# Patient Record
Sex: Female | Born: 1940 | Race: White | Hispanic: No | Marital: Married | Smoking: Former smoker
Health system: Southern US, Community
[De-identification: ages and names within clinical notes are randomized; demographics above are authoritative.]

## PROBLEM LIST (undated history)

## (undated) DIAGNOSIS — R112 Nausea with vomiting, unspecified: Secondary | ICD-10-CM

## (undated) DIAGNOSIS — M109 Gout, unspecified: Secondary | ICD-10-CM

## (undated) DIAGNOSIS — I639 Cerebral infarction, unspecified: Secondary | ICD-10-CM

## (undated) DIAGNOSIS — I10 Essential (primary) hypertension: Secondary | ICD-10-CM

## (undated) DIAGNOSIS — E7800 Pure hypercholesterolemia, unspecified: Secondary | ICD-10-CM

## (undated) DIAGNOSIS — M1990 Unspecified osteoarthritis, unspecified site: Secondary | ICD-10-CM

## (undated) DIAGNOSIS — Z9889 Other specified postprocedural states: Secondary | ICD-10-CM

## (undated) DIAGNOSIS — E119 Type 2 diabetes mellitus without complications: Secondary | ICD-10-CM

## (undated) DIAGNOSIS — M0520 Rheumatoid vasculitis with rheumatoid arthritis of unspecified site: Secondary | ICD-10-CM

## (undated) HISTORY — DX: Unspecified osteoarthritis, unspecified site: M19.90

## (undated) HISTORY — DX: Essential (primary) hypertension: I10

## (undated) HISTORY — DX: Hypercalcemia: E83.52

## (undated) HISTORY — DX: Pure hypercholesterolemia, unspecified: E78.00

## (undated) HISTORY — DX: Type 2 diabetes mellitus without complications: E11.9

## (undated) HISTORY — DX: Rheumatoid vasculitis with rheumatoid arthritis of unspecified site: M05.20

---
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
## 1962-10-21 HISTORY — PX: DILATION AND CURETTAGE OF UTERUS: SHX78

## 1971-10-22 HISTORY — PX: PARTIAL HYSTERECTOMY: SHX80

## 1992-10-21 HISTORY — PX: OTHER SURGICAL HISTORY: SHX169

## 1996-10-21 HISTORY — PX: OTHER SURGICAL HISTORY: SHX169

## 2008-10-21 HISTORY — PX: MENISCUS REPAIR: SHX5179

## 2011-04-04 DIAGNOSIS — R0989 Other specified symptoms and signs involving the circulatory and respiratory systems: Secondary | ICD-10-CM | POA: Insufficient documentation

## 2012-01-24 DIAGNOSIS — H6980 Other specified disorders of Eustachian tube, unspecified ear: Secondary | ICD-10-CM | POA: Insufficient documentation

## 2014-10-05 DIAGNOSIS — E7800 Pure hypercholesterolemia, unspecified: Secondary | ICD-10-CM | POA: Insufficient documentation

## 2014-10-05 DIAGNOSIS — M0579 Rheumatoid arthritis with rheumatoid factor of multiple sites without organ or systems involvement: Secondary | ICD-10-CM | POA: Insufficient documentation

## 2015-04-06 DIAGNOSIS — I10 Essential (primary) hypertension: Secondary | ICD-10-CM | POA: Insufficient documentation

## 2015-10-09 DIAGNOSIS — E119 Type 2 diabetes mellitus without complications: Secondary | ICD-10-CM | POA: Insufficient documentation

## 2015-10-09 DIAGNOSIS — R109 Unspecified abdominal pain: Secondary | ICD-10-CM | POA: Insufficient documentation

## 2016-10-21 HISTORY — PX: OTHER SURGICAL HISTORY: SHX169

## 2017-04-09 DIAGNOSIS — M0579 Rheumatoid arthritis with rheumatoid factor of multiple sites without organ or systems involvement: Secondary | ICD-10-CM | POA: Diagnosis not present

## 2017-04-09 DIAGNOSIS — I10 Essential (primary) hypertension: Secondary | ICD-10-CM | POA: Diagnosis not present

## 2017-04-09 DIAGNOSIS — E7800 Pure hypercholesterolemia, unspecified: Secondary | ICD-10-CM | POA: Diagnosis not present

## 2017-04-09 DIAGNOSIS — N8110 Cystocele, unspecified: Secondary | ICD-10-CM | POA: Diagnosis not present

## 2017-04-09 DIAGNOSIS — M25561 Pain in right knee: Secondary | ICD-10-CM | POA: Diagnosis not present

## 2017-04-09 DIAGNOSIS — M7050 Other bursitis of knee, unspecified knee: Secondary | ICD-10-CM | POA: Diagnosis not present

## 2017-04-09 DIAGNOSIS — E119 Type 2 diabetes mellitus without complications: Secondary | ICD-10-CM | POA: Diagnosis not present

## 2017-05-14 DIAGNOSIS — N393 Stress incontinence (female) (male): Secondary | ICD-10-CM | POA: Diagnosis not present

## 2017-05-14 DIAGNOSIS — N993 Prolapse of vaginal vault after hysterectomy: Secondary | ICD-10-CM | POA: Diagnosis not present

## 2017-05-14 DIAGNOSIS — I10 Essential (primary) hypertension: Secondary | ICD-10-CM | POA: Diagnosis not present

## 2017-07-17 DIAGNOSIS — Z23 Encounter for immunization: Secondary | ICD-10-CM | POA: Diagnosis not present

## 2017-07-23 DIAGNOSIS — I10 Essential (primary) hypertension: Secondary | ICD-10-CM | POA: Diagnosis not present

## 2017-07-23 DIAGNOSIS — M7052 Other bursitis of knee, left knee: Secondary | ICD-10-CM | POA: Diagnosis not present

## 2017-07-24 DIAGNOSIS — R9431 Abnormal electrocardiogram [ECG] [EKG]: Secondary | ICD-10-CM | POA: Diagnosis not present

## 2017-07-24 DIAGNOSIS — Z01818 Encounter for other preprocedural examination: Secondary | ICD-10-CM | POA: Diagnosis not present

## 2017-07-28 DIAGNOSIS — I4510 Unspecified right bundle-branch block: Secondary | ICD-10-CM | POA: Diagnosis not present

## 2017-07-28 DIAGNOSIS — I10 Essential (primary) hypertension: Secondary | ICD-10-CM | POA: Diagnosis not present

## 2017-07-30 DIAGNOSIS — I4510 Unspecified right bundle-branch block: Secondary | ICD-10-CM | POA: Diagnosis not present

## 2017-07-31 DIAGNOSIS — N393 Stress incontinence (female) (male): Secondary | ICD-10-CM | POA: Diagnosis not present

## 2017-07-31 DIAGNOSIS — Z79899 Other long term (current) drug therapy: Secondary | ICD-10-CM | POA: Diagnosis not present

## 2017-07-31 DIAGNOSIS — N993 Prolapse of vaginal vault after hysterectomy: Secondary | ICD-10-CM | POA: Insufficient documentation

## 2017-07-31 DIAGNOSIS — Z87891 Personal history of nicotine dependence: Secondary | ICD-10-CM | POA: Diagnosis not present

## 2017-07-31 DIAGNOSIS — Z8673 Personal history of transient ischemic attack (TIA), and cerebral infarction without residual deficits: Secondary | ICD-10-CM | POA: Diagnosis not present

## 2017-07-31 DIAGNOSIS — Z880 Allergy status to penicillin: Secondary | ICD-10-CM | POA: Diagnosis not present

## 2017-07-31 DIAGNOSIS — E119 Type 2 diabetes mellitus without complications: Secondary | ICD-10-CM | POA: Diagnosis not present

## 2017-07-31 DIAGNOSIS — M069 Rheumatoid arthritis, unspecified: Secondary | ICD-10-CM | POA: Diagnosis not present

## 2017-07-31 DIAGNOSIS — E785 Hyperlipidemia, unspecified: Secondary | ICD-10-CM | POA: Diagnosis not present

## 2017-07-31 DIAGNOSIS — I10 Essential (primary) hypertension: Secondary | ICD-10-CM | POA: Diagnosis not present

## 2017-08-01 DIAGNOSIS — M069 Rheumatoid arthritis, unspecified: Secondary | ICD-10-CM | POA: Diagnosis not present

## 2017-08-01 DIAGNOSIS — Z87891 Personal history of nicotine dependence: Secondary | ICD-10-CM | POA: Diagnosis not present

## 2017-08-01 DIAGNOSIS — I10 Essential (primary) hypertension: Secondary | ICD-10-CM | POA: Diagnosis not present

## 2017-08-01 DIAGNOSIS — Z880 Allergy status to penicillin: Secondary | ICD-10-CM | POA: Diagnosis not present

## 2017-08-01 DIAGNOSIS — E119 Type 2 diabetes mellitus without complications: Secondary | ICD-10-CM | POA: Diagnosis not present

## 2017-08-01 DIAGNOSIS — E785 Hyperlipidemia, unspecified: Secondary | ICD-10-CM | POA: Diagnosis not present

## 2017-08-01 DIAGNOSIS — Z79899 Other long term (current) drug therapy: Secondary | ICD-10-CM | POA: Diagnosis not present

## 2017-08-01 DIAGNOSIS — N993 Prolapse of vaginal vault after hysterectomy: Secondary | ICD-10-CM | POA: Diagnosis not present

## 2017-08-01 DIAGNOSIS — N393 Stress incontinence (female) (male): Secondary | ICD-10-CM | POA: Diagnosis not present

## 2017-08-01 DIAGNOSIS — Z8673 Personal history of transient ischemic attack (TIA), and cerebral infarction without residual deficits: Secondary | ICD-10-CM | POA: Diagnosis not present

## 2017-08-07 DIAGNOSIS — N993 Prolapse of vaginal vault after hysterectomy: Secondary | ICD-10-CM | POA: Diagnosis not present

## 2017-08-07 DIAGNOSIS — N393 Stress incontinence (female) (male): Secondary | ICD-10-CM | POA: Diagnosis not present

## 2017-09-10 DIAGNOSIS — Z0000 Encounter for general adult medical examination without abnormal findings: Secondary | ICD-10-CM | POA: Diagnosis not present

## 2017-09-10 DIAGNOSIS — E7800 Pure hypercholesterolemia, unspecified: Secondary | ICD-10-CM | POA: Diagnosis not present

## 2017-09-10 DIAGNOSIS — E119 Type 2 diabetes mellitus without complications: Secondary | ICD-10-CM | POA: Diagnosis not present

## 2017-09-10 DIAGNOSIS — I10 Essential (primary) hypertension: Secondary | ICD-10-CM | POA: Diagnosis not present

## 2017-09-10 DIAGNOSIS — M0579 Rheumatoid arthritis with rheumatoid factor of multiple sites without organ or systems involvement: Secondary | ICD-10-CM | POA: Diagnosis not present

## 2017-11-04 DIAGNOSIS — I10 Essential (primary) hypertension: Secondary | ICD-10-CM | POA: Diagnosis not present

## 2017-11-04 DIAGNOSIS — N993 Prolapse of vaginal vault after hysterectomy: Secondary | ICD-10-CM | POA: Diagnosis not present

## 2017-11-04 DIAGNOSIS — N393 Stress incontinence (female) (male): Secondary | ICD-10-CM | POA: Diagnosis not present

## 2017-11-21 DIAGNOSIS — Z1231 Encounter for screening mammogram for malignant neoplasm of breast: Secondary | ICD-10-CM | POA: Diagnosis not present

## 2017-12-17 DIAGNOSIS — M0579 Rheumatoid arthritis with rheumatoid factor of multiple sites without organ or systems involvement: Secondary | ICD-10-CM | POA: Diagnosis not present

## 2017-12-17 DIAGNOSIS — M542 Cervicalgia: Secondary | ICD-10-CM | POA: Diagnosis not present

## 2017-12-17 DIAGNOSIS — I10 Essential (primary) hypertension: Secondary | ICD-10-CM | POA: Diagnosis not present

## 2017-12-17 DIAGNOSIS — E7800 Pure hypercholesterolemia, unspecified: Secondary | ICD-10-CM | POA: Diagnosis not present

## 2017-12-17 DIAGNOSIS — E119 Type 2 diabetes mellitus without complications: Secondary | ICD-10-CM | POA: Diagnosis not present

## 2018-03-17 ENCOUNTER — Ambulatory Visit
Admission: RE | Admit: 2018-03-17 | Discharge: 2018-03-17 | Disposition: A | Discharge status: Home / Self Care | Payer: Medicare HMO | Source: Ambulatory Visit | Attending: Internal Medicine | Admitting: Internal Medicine

## 2018-03-17 ENCOUNTER — Ambulatory Visit (INDEPENDENT_AMBULATORY_CARE_PROVIDER_SITE_OTHER): Payer: Medicare HMO | Admitting: Internal Medicine

## 2018-03-17 ENCOUNTER — Encounter: Payer: Self-pay | Admitting: Internal Medicine

## 2018-03-17 VITALS — BP 146/80 | HR 75 | Temp 98.4°F | Ht 61.5 in | Wt 167.0 lb

## 2018-03-17 DIAGNOSIS — E785 Hyperlipidemia, unspecified: Secondary | ICD-10-CM | POA: Diagnosis not present

## 2018-03-17 DIAGNOSIS — M159 Polyosteoarthritis, unspecified: Secondary | ICD-10-CM | POA: Insufficient documentation

## 2018-03-17 DIAGNOSIS — M25561 Pain in right knee: Principal | ICD-10-CM

## 2018-03-17 DIAGNOSIS — I10 Essential (primary) hypertension: Secondary | ICD-10-CM | POA: Diagnosis not present

## 2018-03-17 DIAGNOSIS — M1711 Unilateral primary osteoarthritis, right knee: Secondary | ICD-10-CM | POA: Diagnosis not present

## 2018-03-17 DIAGNOSIS — Z79899 Other long term (current) drug therapy: Secondary | ICD-10-CM

## 2018-03-17 DIAGNOSIS — M1A9XX0 Chronic gout, unspecified, without tophus (tophi): Secondary | ICD-10-CM | POA: Diagnosis not present

## 2018-03-17 DIAGNOSIS — G8929 Other chronic pain: Secondary | ICD-10-CM | POA: Diagnosis not present

## 2018-03-17 DIAGNOSIS — E782 Mixed hyperlipidemia: Secondary | ICD-10-CM | POA: Insufficient documentation

## 2018-03-17 DIAGNOSIS — M8949 Other hypertrophic osteoarthropathy, multiple sites: Secondary | ICD-10-CM | POA: Insufficient documentation

## 2018-03-17 DIAGNOSIS — M150 Primary generalized (osteo)arthritis: Secondary | ICD-10-CM | POA: Diagnosis not present

## 2018-03-17 DIAGNOSIS — M069 Rheumatoid arthritis, unspecified: Secondary | ICD-10-CM | POA: Diagnosis not present

## 2018-03-17 DIAGNOSIS — E1169 Type 2 diabetes mellitus with other specified complication: Secondary | ICD-10-CM

## 2018-03-17 MED ORDER — AMLODIPINE BESYLATE 5 MG PO TABS: 5.0000 mg | ORAL_TABLET | Freq: Every day | ORAL | 1 refills | Status: DC

## 2018-03-17 NOTE — Progress Notes (Signed)
Patient ID: Debra Madden, female   DOB: [birth year], 77 y.o.   MRN: [id number]   Location:  [facility]  Provider: DR Arletha Grippe  Code Status:  Goals of Care:  Advanced Directives 03/17/2018  Does Patient Have a Medical Advance Directive? Yes  Type of Advance Directive [place]  Does patient want to make changes to medical advance directive? No - Patient declined  Copy of [place] in Chart? No - copy requested     Chief Complaint  Patient presents with  . Establish Care    New patient, establish care, relocated from [place], [place]. Patient with increased pain in right lower leg x 3 weeks.     . Medication Refill    Norvasc 90 day supply, mailorder   . Advance Care Planning    HCPOA, copy requested     HPI: Patient is a 77 y.o. female seen today as new pt. She relocated from [place], [place] January 17, 2018 to be closer to family. She has achy pain in RLE x [redacted] weeks along lateral leg/thigh. Pain worse with prolonged sitting. No numbness/tingling. No heavy lifting but did a lot of stooping and bending while moving and getting new home ready. She has hx right plantar fasciitis. She takes aleve prn severe pain. Current pain is 0/10. She had right meniscal repair 9 yrs ago. She has DM and BS this AM was 119. Her last A1c <7%.  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 Takes MTX 6.4WO weekly + folic acid  Gout - stable on allopurinol. 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 She takes ASA daily  DM - BS stable on metformin; reported A1c <7%   Past Medical History:  Diagnosis Date  . Arthritis   . DM (diabetes mellitus) ([place])   . Hypercalcemia   . Hypercholesteremia   . Hypertension   . Rheumatoid arteritis     Past Surgical History:  Procedure Laterality Date  . bladder prolapse  2018   Dr. Ladean Raya  . carpal tunnel  Bilateral 1998  . DILATION AND CURETTAGE OF UTERUS  1964   Dr. Leonia Reader  . MENISCUS REPAIR Right 2010   Dr. Ladean Raya  . PARTIAL HYSTERECTOMY  1973   ovaries remain  . Stroke  1994   TIA  . TONSILLECTOMY  1956     reports that she has quit smoking. She has never used smokeless tobacco. She reports that she drinks about 4.2 oz of alcohol per week. She reports that she does not use drugs. Social History   Socioeconomic History  . Marital status: Married    Spouse name: Not on file  . Number of children: Not on file  . Years of education: Not on file  . Highest education level: Not on file  Occupational History  . Not on file  Social Needs  . Financial resource strain: Not on file  . Food insecurity:    Worry: Not on file    Inability: Not on file  . Transportation needs:    Medical: Not on file    Non-medical: Not on file  Tobacco Use  . Smoking status: Former Research scientist (life sciences)  . Smokeless tobacco: Never Used  . Tobacco comment: Quit at age 28  Substance and Sexual Activity  . Alcohol use: Yes    Alcohol/week: 4.2 oz  Types: 7 Glasses of wine per week  . Drug use: Never  . Sexual activity: Not Currently  Lifestyle  . Physical activity:    Days per week: Not on file    Minutes per session: Not on file  . Stress: Not on file  Relationships  . Social connections:    Talks on phone: Not on file    Gets together: Not on file    Attends religious service: Not on file    Active member of club or organization: Not on file    Attends meetings of clubs or organizations: Not on file    Relationship status: Not on file  . Intimate partner violence:    Fear of current or ex partner: Not on file    Emotionally abused: Not on file    Physically abused: Not on file    Forced sexual activity: Not on file  Other Topics Concern  . Not on file  Social History Narrative   Social History      Diet? Meats, fish, salads, occasionally shellfish, eggs, vegs, broc, cauliflower,  brusel sprouts, green beans, mixed vegs, potatoes, some pasta/rice.       Do you drink/eat things with caffeine? Coffee- 1 1/2 cup per day      Marital status?           married                         What year were you married? 1964      Do you live in a house, apartment, assisted living, condo, trailer, etc.? house      Is it one or more stories? One- no stairs      How many persons live in your home? 2      Do you have any pets in your home? (please list) no      Highest level of education completed? 4 year college degree      Current or past profession: teacher      Do you exercise?         yes                             Type & how often? Walk, water aerobics      Advanced Directives      Do you have a living will? yes      Do you have a DNR form?        no                          If not, do you want to discuss one?      Do you have signed POA/HPOA for forms? yes      Functional Status      Do you have difficulty bathing or dressing yourself? no      Do you have difficulty preparing food or eating? no      Do you have difficulty managing your medications? no      Do you have difficulty managing your finances? no      Do you have difficulty affording your medications? no    Family History  Problem Relation Age of Onset  . Hypertension Son   . Hypertension Son   . Hypertension Son     Allergies  Allergen Reactions  . Oxycodone Nausea And Vomiting  . Clindamycin/Lincomycin Rash  .  Penicillins Rash    Outpatient Encounter Medications as of 03/17/2018  Medication Sig  . allopurinol (ZYLOPRIM) 300 MG tablet Take 300 mg by mouth daily.  Marland Kitchen amLODipine (NORVASC) 5 MG tablet Take 5 mg by mouth daily.  Marland Kitchen aspirin EC 325 MG tablet Take 325 mg by mouth daily.  Marland Kitchen BIOTIN 5000 PO Take 1 capsule by mouth daily.  . cholecalciferol (VITAMIN D) 1000 units tablet Take 2,000 Units by mouth daily.  Marland Kitchen docusate sodium (COLACE) 100 MG capsule Take 100 mg by mouth daily as  needed for mild constipation.  . folic acid (FOLVITE) 035 MCG tablet Take 400 mcg by mouth daily.  . hydrochlorothiazide (HYDRODIURIL) 25 MG tablet Take 25 mg by mouth daily.  Marland Kitchen losartan (COZAAR) 100 MG tablet Take 100 mg by mouth daily.  . metFORMIN (GLUCOPHAGE) 500 MG tablet Take 500 mg by mouth daily.  . methotrexate 2.5 MG tablet Take 7.5 mg by mouth every 7 (seven) days.  . naproxen sodium (ALEVE) 220 MG tablet Take 220 mg by mouth daily as needed.  . pravastatin (PRAVACHOL) 40 MG tablet Take 40 mg by mouth daily.  . timolol (BETIMOL) 0.5 % ophthalmic solution Place 1 drop into both eyes daily.   No facility-administered encounter medications on file as of 03/17/2018.     Review of Systems:  Review of Systems  HENT:       Sinus problems  Eyes: Positive for visual disturbance (corrective lenses).  Musculoskeletal: Positive for arthralgias and joint swelling.  Psychiatric/Behavioral: Positive for sleep disturbance.  All other systems reviewed and are negative.   Health Maintenance  Topic Date Due  . TETANUS/TDAP  04/21/1960  . DEXA SCAN  04/21/2006  . PNA vac Low Risk Adult (2 of 2 - PCV13) 10/22/2015  . INFLUENZA VACCINE  05/21/2018    Physical Exam: Vitals:   03/17/18 1118  BP: (!) 146/80  Pulse: 75  Temp: 98.4 F (36.9 C)  TempSrc: Oral  SpO2: 98%  Weight: 167 lb (75.8 kg)  Height: 5' 1.5" (1.562 m)   Body mass index is 31.04 kg/m. Physical Exam  Constitutional: She is oriented to person, place, and time. She appears well-developed and well-nourished.  HENT:  Mouth/Throat: Oropharynx is clear and moist. No oropharyngeal exudate.  MMM; no oral thrush  Eyes: Pupils are equal, round, and reactive to light. No scleral icterus.  Neck: Neck supple. Carotid bruit is not present. No tracheal deviation present. No thyromegaly present.  Cardiovascular: Normal rate, regular rhythm and intact distal pulses. Exam reveals no gallop and no friction rub.  Murmur (1/6 SEM)  heard. No LE edema b/l. no calf TTP.   Pulmonary/Chest: Effort normal and breath sounds normal. No stridor. No respiratory distress. She has no wheezes. She has no rales.  Abdominal: Soft. Normal appearance and bowel sounds are normal. She exhibits no distension and no mass. There is no hepatomegaly. There is no tenderness. There is no rigidity, no rebound and no guarding. No hernia.  obese  Musculoskeletal: She exhibits edema.  Reduced right knee ROM with TTP laterally; right proximal fibular head TTP with swelling and increased warmth to touch  Lymphadenopathy:    She has no cervical adenopathy.  Neurological: She is alert and oriented to person, place, and time. She has normal reflexes. Gait (antalgic) abnormal.  Skin: Skin is warm and dry. No rash noted.  Psychiatric: She has a normal mood and affect. Her behavior is normal. Judgment and thought content normal.    Labs reviewed:  Basic Metabolic Panel: No results for input(s): NA, K, CL, CO2, GLUCOSE, BUN, CREATININE, CALCIUM, MG, PHOS, TSH in the last 8760 hours. Liver Function Tests: No results for input(s): AST, ALT, ALKPHOS, BILITOT, PROT, ALBUMIN in the last 8760 hours. No results for input(s): LIPASE, AMYLASE in the last 8760 hours. No results for input(s): AMMONIA in the last 8760 hours. CBC: No results for input(s): WBC, NEUTROABS, HGB, HCT, MCV, PLT in the last 8760 hours. Lipid Panel: No results for input(s): CHOL, HDL, LDLCALC, TRIG, CHOLHDL, LDLDIRECT in the last 8760 hours. No results found for: HGBA1C  Procedures since last visit: No results found.  Assessment/Plan   ICD-10-CM   1. Essential hypertension I10 amLODipine (NORVASC) 5 MG tablet  2. Rheumatoid arthritis, involving unspecified site, unspecified rheumatoid factor presence (HCC) E09.2 Cyclic citrul peptide antibody, IgG    ANA    Sedimentation Rate    C-reactive Protein    Rheumatoid Factor  3. Chronic gout without tophus, unspecified cause, unspecified  site M1A.9XX0 Uric Acid  4. 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 High risk medication use Z79.899 CBC with Differential/Platelets    CMP with eGFR([facility])  6. Chronic pain of right knee M25.561 DG Knee Complete 4 Views Right   G89.29   Will call with lab and xray results  Continue other medications as ordered  May need to see specialist (rheumatology for joints and/or Orthopedic or physical therapy)  Get old records from previous PCP  Follow up in 3 mos RA, gout, HTN, OA, hyperlipidemia. Fasting labs prior to appt     [place] S. Perlie Gold  [facility] [address] [place], [place] [address] [phone] Cell (Monday-Friday 8 AM - 5 PM) [phone] After 5 PM and follow prompts

## 2018-03-17 NOTE — Patient Instructions (Addendum)
Will call with lab and xray results  Continue other medications as ordered  May need to see specialist (rheumatology for joints and/or Orthopedic or physical therapy)  Get old records from previous PCP  Follow up in 3 mos RA, gout, HTN, OA, hyperlipidemia. Fasting labs prior to appt

## 2018-03-19 LAB — CBC WITH DIFFERENTIAL/PLATELET
BASOS ABS: 66 {cells}/uL (ref 0–200)
Basophils Relative: 0.7 %
EOS ABS: 179 {cells}/uL (ref 15–500)
EOS PCT: 1.9 %
HCT: 41.8 % (ref 35.0–45.0)
HEMOGLOBIN: 14.4 g/dL (ref 11.7–15.5)
Lymphs Abs: 2294 cells/uL (ref 850–3900)
MCH: 33.2 pg — AB (ref 27.0–33.0)
MCHC: 34.4 g/dL (ref 32.0–36.0)
MCV: 96.3 fL (ref 80.0–100.0)
MONOS PCT: 6.7 %
MPV: 9.1 fL (ref 7.5–12.5)
NEUTROS PCT: 66.3 %
Neutro Abs: 6232 cells/uL (ref 1500–7800)
Platelets: 294 10*3/uL (ref 140–400)
RBC: 4.34 10*6/uL (ref 3.80–5.10)
RDW: 12.8 % (ref 11.0–15.0)
Total Lymphocyte: 24.4 %
WBC mixed population: 630 cells/uL (ref 200–950)
WBC: 9.4 10*3/uL (ref 3.8–10.8)

## 2018-03-19 LAB — COMPLETE METABOLIC PANEL WITH GFR
AG RATIO: 2.4 (calc) (ref 1.0–2.5)
ALBUMIN MSPROF: 4.8 g/dL (ref 3.6–5.1)
ALKALINE PHOSPHATASE (APISO): 90 U/L (ref 33–130)
ALT: 15 U/L (ref 6–29)
AST: 18 U/L (ref 10–35)
BILIRUBIN TOTAL: 0.8 mg/dL (ref 0.2–1.2)
BUN: 20 mg/dL (ref 7–25)
CHLORIDE: 102 mmol/L (ref 98–110)
CO2: 29 mmol/L (ref 20–32)
Calcium: 11.1 mg/dL — ABNORMAL HIGH (ref 8.6–10.4)
Creat: 0.67 mg/dL (ref 0.60–0.93)
GFR, Est African American: 99 mL/min/{1.73_m2} (ref >=60)
GFR, Est Non African American: 85 mL/min/{1.73_m2} (ref >=60)
GLUCOSE: 87 mg/dL (ref 65–139)
Globulin: 2 g/dL (calc) (ref 1.9–3.7)
POTASSIUM: 4.1 mmol/L (ref 3.5–5.3)
Sodium: 140 mmol/L (ref 135–146)
Total Protein: 6.8 g/dL (ref 6.1–8.1)

## 2018-03-19 LAB — ANA: ANA: NEGATIVE

## 2018-03-19 LAB — SEDIMENTATION RATE: Sed Rate: 2 mm/h (ref 0–30)

## 2018-03-19 LAB — RHEUMATOID FACTOR: Rhuematoid fact SerPl-aCnc: <14 IU/mL (ref <14)

## 2018-03-19 LAB — CYCLIC CITRUL PEPTIDE ANTIBODY, IGG

## 2018-03-19 LAB — URIC ACID: URIC ACID, SERUM: 3.9 mg/dL (ref 2.5–7.0)

## 2018-03-19 LAB — C-REACTIVE PROTEIN: CRP: 2.3 mg/L (ref <8.0)

## 2018-03-24 ENCOUNTER — Other Ambulatory Visit: Payer: Medicare HMO

## 2018-03-25 LAB — PTH, INTACT AND CALCIUM
Calcium: 11.4 mg/dL — ABNORMAL HIGH (ref 8.6–10.4)
PTH: 50 pg/mL (ref 14–64)

## 2018-04-07 ENCOUNTER — Other Ambulatory Visit: Payer: Self-pay | Admitting: *Deleted

## 2018-04-07 MED ORDER — ALLOPURINOL 300 MG PO TABS: 300.0000 mg | ORAL_TABLET | Freq: Every day | ORAL | 0 refills | Status: DC

## 2018-04-07 NOTE — Telephone Encounter (Signed)
Patient requested refill

## 2018-04-30 ENCOUNTER — Other Ambulatory Visit: Payer: Medicare HMO

## 2018-04-30 ENCOUNTER — Telehealth: Payer: Self-pay | Admitting: *Deleted

## 2018-04-30 DIAGNOSIS — R300 Dysuria: Secondary | ICD-10-CM

## 2018-04-30 DIAGNOSIS — R350 Frequency of micturition: Secondary | ICD-10-CM

## 2018-04-30 NOTE — Telephone Encounter (Signed)
Patient called c/o possible UrinaryTract Infection (UTI)  1. What symptoms are you having (frequency, urgency, dysuria, incontinence, confusion)?  Frequency, Urgency, Burning with urination  2. Any fever or chills?  No   3. Any suprapubic pain?   When urinates  4. Have you taken anything OTC for symptoms?  AZO with no relief. Stated that Bactrim has worked for her in the Past. Patient stated that she has gotten UTI's before. Patient stated that she knows she has a UTI and would like the antibiotic called to pharmacy. No Available Appointments.   5. How much water are you drinking daily? Yes  6. How long have you had your symptoms (onset)?   For about 3 days  I will forward this information to your provider and call with instructions, if your symptoms persist or progress seek medical attention at your nearest urgent care or emergency room. Patient verbalized understanding.  [facility].

## 2018-04-30 NOTE — Telephone Encounter (Signed)
She needs to submit urine sample and send for UA cx and sens prior to prescribing medication

## 2018-04-30 NOTE — Telephone Encounter (Signed)
Patient notified. Patient upset that she has to come in to leave specimen but agreed to.Lab appointment scheduled and order placed.

## 2018-05-01 ENCOUNTER — Telehealth: Payer: Self-pay

## 2018-05-01 MED ORDER — SULFAMETHOXAZOLE-TRIMETHOPRIM 800-160 MG PO TABS: 1.0000 | ORAL_TABLET | Freq: Two times a day (BID) | ORAL | 0 refills | Status: DC

## 2018-05-01 NOTE — Telephone Encounter (Signed)
rx has been sent to the pharmacy. Patient notified and agreed.

## 2018-05-01 NOTE — Telephone Encounter (Signed)
-----   Message from [place], DO sent at 05/01/2018  9:35 AM EDT ----- (+) UTI - culture and sensitivities pending. She has been treated with bactrim in the past per patient with success. Rx bactrim DS #14 take 1 tab po BID x 7 days; no refills

## 2018-05-02 LAB — URINE CULTURE
MICRO NUMBER: 90825422
SPECIMEN QUALITY:: ADEQUATE

## 2018-05-02 LAB — URINALYSIS
Bilirubin Urine: NEGATIVE
Glucose, UA: NEGATIVE
KETONES UR: NEGATIVE
NITRITE: POSITIVE — AB
Specific Gravity, Urine: 1.009 (ref 1.001–1.03)
pH: 7 (ref 5.0–8.0)

## 2018-05-04 ENCOUNTER — Telehealth: Payer: Self-pay

## 2018-05-04 MED ORDER — CIPROFLOXACIN HCL 500 MG PO TABS: 500.0000 mg | ORAL_TABLET | Freq: Two times a day (BID) | ORAL | 0 refills | Status: DC

## 2018-05-04 NOTE — Telephone Encounter (Signed)
-----   Message from [place], [place] sent at 05/04/2018 10:52 AM EDT ----- Rx Cipro 500mg  #14 take 1 tab po BID x 7 days with no RF

## 2018-05-04 NOTE — Telephone Encounter (Signed)
Medication list has been updated and rx was sent to the pharmacy. Patient aware of rx.     Notes recorded by Kirt Boys, DO on 05/04/2018 at 10:52 AM EDT Rx Cipro 500mg  #14 take 1 tab po BID x 7 days with no RF ------  Notes recorded by May, Anita A, CMA on 05/04/2018 at 10:23 AM EDT Patient notified and agreed. Patient has stopped the Bactrim this weekend due to it making her sick. Would like for you to call in the Cipro to [facility]. Stated that she would pick it up this afternoon. ------  Notes recorded by 05/06/2018, CMA on 05/04/2018 at 9:57 AM EDT I spoke to patient's husband and he attempted to give patient the phone but she stated she would call back. ------  Notes recorded by 05/06/2018, DO on 05/02/2018 at 3:32 PM EDT Bug in bladder infection is weakly sensitive to bactrim - recommend you stop bactrim and take ciprofloxacin instead as it will provide better coverage and is more effective

## 2018-05-11 ENCOUNTER — Other Ambulatory Visit: Payer: Self-pay | Admitting: *Deleted

## 2018-05-11 MED ORDER — METFORMIN HCL 500 MG PO TABS: 500.0000 mg | ORAL_TABLET | Freq: Every day | ORAL | 1 refills | Status: DC

## 2018-05-11 MED ORDER — PRAVASTATIN SODIUM 40 MG PO TABS: 40.0000 mg | ORAL_TABLET | Freq: Every day | ORAL | 1 refills | Status: DC

## 2018-05-11 MED ORDER — HYDROCHLOROTHIAZIDE 25 MG PO TABS: 25.0000 mg | ORAL_TABLET | Freq: Every day | ORAL | 1 refills | Status: DC

## 2018-06-01 ENCOUNTER — Telehealth: Payer: Self-pay | Admitting: Internal Medicine

## 2018-06-01 NOTE — Telephone Encounter (Signed)
I left a message asking the pt to call me at [phone] to schedule AWV. VDM (DD) °

## 2018-06-08 ENCOUNTER — Other Ambulatory Visit: Payer: Medicare HMO

## 2018-06-08 DIAGNOSIS — E785 Hyperlipidemia, unspecified: Secondary | ICD-10-CM

## 2018-06-08 DIAGNOSIS — Z79899 Other long term (current) drug therapy: Secondary | ICD-10-CM

## 2018-06-08 DIAGNOSIS — E1169 Type 2 diabetes mellitus with other specified complication: Secondary | ICD-10-CM

## 2018-06-09 LAB — CBC WITH DIFFERENTIAL/PLATELET
Basophils Absolute: 61 cells/uL (ref 0–200)
Basophils Relative: 1 %
EOS PCT: 2.1 %
Eosinophils Absolute: 128 cells/uL (ref 15–500)
HEMATOCRIT: 41.1 % (ref 35.0–45.0)
Hemoglobin: 13.8 g/dL (ref 11.7–15.5)
LYMPHS ABS: 2227 {cells}/uL (ref 850–3900)
MCH: 32.3 pg (ref 27.0–33.0)
MCHC: 33.6 g/dL (ref 32.0–36.0)
MCV: 96.3 fL (ref 80.0–100.0)
MPV: 9.2 fL (ref 7.5–12.5)
Monocytes Relative: 7.8 %
NEUTROS PCT: 52.6 %
Neutro Abs: 3209 cells/uL (ref 1500–7800)
Platelets: 281 10*3/uL (ref 140–400)
RBC: 4.27 10*6/uL (ref 3.80–5.10)
RDW: 12.5 % (ref 11.0–15.0)
Total Lymphocyte: 36.5 %
WBC: 6.1 10*3/uL (ref 3.8–10.8)
WBCMIX: 476 {cells}/uL (ref 200–950)

## 2018-06-09 LAB — COMPLETE METABOLIC PANEL WITH GFR
AG Ratio: 2.3 (calc) (ref 1.0–2.5)
ALT: 16 U/L (ref 6–29)
AST: 19 U/L (ref 10–35)
Albumin: 4.4 g/dL (ref 3.6–5.1)
Alkaline phosphatase (APISO): 76 U/L (ref 33–130)
BUN: 21 mg/dL (ref 7–25)
CALCIUM: 10.8 mg/dL — AB (ref 8.6–10.4)
CO2: 26 mmol/L (ref 20–32)
CREATININE: 0.64 mg/dL (ref 0.60–0.93)
Chloride: 102 mmol/L (ref 98–110)
GFR, EST NON AFRICAN AMERICAN: 86 mL/min/{1.73_m2} (ref >=60)
GFR, Est African American: 100 mL/min/{1.73_m2} (ref >=60)
GLOBULIN: 1.9 g/dL (ref 1.9–3.7)
Glucose, Bld: 120 mg/dL — ABNORMAL HIGH (ref 65–99)
Potassium: 4.2 mmol/L (ref 3.5–5.3)
SODIUM: 137 mmol/L (ref 135–146)
Total Bilirubin: 1 mg/dL (ref 0.2–1.2)
Total Protein: 6.3 g/dL (ref 6.1–8.1)

## 2018-06-09 LAB — LIPID PANEL
Cholesterol: 159 mg/dL (ref <200)
HDL: 57 mg/dL (ref >50)
LDL Cholesterol (Calc): 80 mg/dL (calc)
NON-HDL CHOLESTEROL (CALC): 102 mg/dL (ref <130)
Total CHOL/HDL Ratio: 2.8 (calc) (ref <5.0)
Triglycerides: 123 mg/dL (ref <150)

## 2018-06-09 LAB — MICROALBUMIN / CREATININE URINE RATIO
CREATININE, URINE: 21 mg/dL (ref 20–275)
MICROALB UR: 0.2 mg/dL
MICROALB/CREAT RATIO: 10 ug/mg{creat} (ref <30)

## 2018-06-09 LAB — HEMOGLOBIN A1C
Hgb A1c MFr Bld: 5.7 % of total Hgb — ABNORMAL HIGH (ref <5.7)
Mean Plasma Glucose: 117 (calc)
eAG (mmol/L): 6.5 (calc)

## 2018-06-10 ENCOUNTER — Encounter: Payer: Self-pay | Admitting: Internal Medicine

## 2018-06-10 ENCOUNTER — Ambulatory Visit: Payer: Medicare HMO | Admitting: Internal Medicine

## 2018-06-18 ENCOUNTER — Ambulatory Visit (INDEPENDENT_AMBULATORY_CARE_PROVIDER_SITE_OTHER): Payer: Medicare HMO | Admitting: Family

## 2018-06-18 ENCOUNTER — Encounter: Payer: Self-pay | Admitting: Family

## 2018-06-18 VITALS — BP 160/100 | HR 70 | Temp 98.4°F | Resp 16 | Ht 61.5 in | Wt 166.8 lb

## 2018-06-18 DIAGNOSIS — E1169 Type 2 diabetes mellitus with other specified complication: Secondary | ICD-10-CM

## 2018-06-18 DIAGNOSIS — H6122 Impacted cerumen, left ear: Secondary | ICD-10-CM | POA: Diagnosis not present

## 2018-06-18 DIAGNOSIS — I10 Essential (primary) hypertension: Secondary | ICD-10-CM

## 2018-06-18 DIAGNOSIS — M159 Polyosteoarthritis, unspecified: Secondary | ICD-10-CM

## 2018-06-18 DIAGNOSIS — E782 Mixed hyperlipidemia: Secondary | ICD-10-CM

## 2018-06-18 DIAGNOSIS — M8949 Other hypertrophic osteoarthropathy, multiple sites: Secondary | ICD-10-CM

## 2018-06-18 DIAGNOSIS — M150 Primary generalized (osteo)arthritis: Secondary | ICD-10-CM | POA: Diagnosis not present

## 2018-06-18 MED ORDER — CARBAMIDE PEROXIDE 6.5 % OT SOLN: 5.0000 [drp] | Freq: Two times a day (BID) | OTIC | 0 refills | Status: AC

## 2018-06-18 NOTE — Progress Notes (Signed)
Provider: Thomasa Heidler FNP-C   Kirt Boys, DO  Patient Care Team: Kirt Boys, DO as PCP - General (Internal Medicine)  Extended Emergency Contact Information Primary Emergency Contact: Dauna, Ziska Phone: [phone] Relation: Spouse   Goals of care: Advanced Directive information Advanced Directives 06/18/2018  Does Patient Have a Medical Advance Directive? Yes  Type of Advance Directive Healthcare Power of Attorney  Does patient want to make changes to medical advance directive? -  Copy of Healthcare Power of Attorney in Chart? Yes     Chief Complaint  Patient presents with  . Follow-up    missed appt with Dr. Montez Morita    HPI:  Pt is a 77 y.o. female seen today  for medical management of chronic diseases.she denies any acute issues during visit.   Type 2 DM - she states her blood sugars have been stable in the 100's-110's though did not bring any log.she is on metformin 500 mg tablet daily.she denies any signs of hypo/hyperglycemia.   Hypertension- readings have been stable but elevated this visit.she states B/p is usually high always whenever she goes to any doctor.she does not want her medication adjust today " It will come down". She denies any headache,dizziness or chest pain.Will check blood pressure at home then provide log for evaluation.Recommended increasing amlodipine to 10 mg tablet but patient thinks it's due coming to doctor visit.  Hyperlipidemia - continues to take pravastatin 40 mg tablet daily and watches her diet.    Osteoarthritis - right knee pain under control.     Past Medical History:  Diagnosis Date  . Arthritis   . DM (diabetes mellitus) ([facility])   . Hypercalcemia   . Hypercholesteremia   . Hypertension   . Rheumatoid arteritis    Past Surgical History:  Procedure Laterality Date  . bladder prolapse  2018   Dr. Cranston Neighbor  . carpal tunnel Bilateral 1998  . DILATION AND CURETTAGE OF UTERUS  1964   Dr. Alease Medina  . MENISCUS REPAIR Right 2010   Dr. Cranston Neighbor  . PARTIAL HYSTERECTOMY  1973   ovaries remain  . Stroke  1994   TIA  . TONSILLECTOMY  1956    Allergies  Allergen Reactions  . Bactrim [Sulfamethoxazole-Trimethoprim] Diarrhea  . Clindamycin/Lincomycin Rash and Diarrhea    Terrible stomach cramps per patient  . Oxycodone Nausea And Vomiting and Nausea Only  . Penicillins Rash, Hives and Swelling    Allergies as of 06/18/2018      Reactions   Bactrim [sulfamethoxazole-trimethoprim] Diarrhea   Clindamycin/lincomycin Rash, Diarrhea   Terrible stomach cramps per patient   Oxycodone Nausea And Vomiting, Nausea Only   Penicillins Rash, Hives, Swelling      Medication List        Accurate as of 06/18/18 11:16 AM. Always use your most recent med list.          allopurinol 300 MG tablet Commonly known as:  ZYLOPRIM Take 1 tablet (300 mg total) by mouth daily.   amLODipine 5 MG tablet Commonly known as:  NORVASC Take 1 tablet (5 mg total) by mouth daily.   aspirin EC 325 MG tablet Take 325 mg by mouth daily.   BIOTIN 5000 PO Take 1 capsule by mouth daily.   cholecalciferol 1000 units tablet Commonly known as:  VITAMIN D Take 2,000 Units by mouth daily.   folic acid 400 MCG tablet Commonly known as:  FOLVITE Take 400 mcg by mouth daily.   hydrochlorothiazide 25  MG tablet Commonly known as:  HYDRODIURIL Take 1 tablet (25 mg total) by mouth daily.   losartan 100 MG tablet Commonly known as:  COZAAR Take 100 mg by mouth daily.   metFORMIN 500 MG tablet Commonly known as:  GLUCOPHAGE Take 1 tablet (500 mg total) by mouth daily.   naproxen sodium 220 MG tablet Commonly known as:  ALEVE Take 220 mg by mouth daily as needed.   pravastatin 40 MG tablet Commonly known as:  PRAVACHOL Take 1 tablet (40 mg total) by mouth daily.   timolol 0.5 % ophthalmic solution Commonly known as:  BETIMOL Place 1 drop into both eyes daily.       Review of  Systems  Constitutional: Negative for appetite change, chills, fatigue, fever and unexpected weight change.  HENT: Negative for congestion, rhinorrhea, sinus pressure, sinus pain, sneezing and sore throat.   Eyes: Positive for visual disturbance. Negative for discharge, redness and itching.       Wears corrective lens   Respiratory: Negative for cough, chest tightness, shortness of breath and wheezing.   Cardiovascular: Negative for chest pain, palpitations and leg swelling.  Gastrointestinal: Negative for abdominal distention, abdominal pain, constipation, diarrhea, nausea and vomiting.  Endocrine: Negative for cold intolerance, heat intolerance, polydipsia, polyphagia and polyuria.  Genitourinary: Negative for dysuria, flank pain, frequency and urgency.  Musculoskeletal: Positive for arthralgias. Negative for gait problem and myalgias.  Skin: Negative for color change, pallor, rash and wound.  Neurological: Negative for dizziness, weakness, light-headedness and headaches.  Hematological: Does not bruise/bleed easily.  Psychiatric/Behavioral: Negative for agitation, confusion and sleep disturbance. The patient is not nervous/anxious.     Immunization History  Administered Date(s) Administered  . Influenza Split 07/20/2012  . Influenza, High Dose Seasonal PF 07/06/2014, 07/04/2016, 07/17/2017  . Influenza, Quadrivalent, Recombinant, Inj, Pf 07/07/2013  . Influenza, Seasonal, Injecte, Preservative Fre 08/11/2015  . Pneumococcal Conjugate-13 04/06/2015, 09/05/2016  . Pneumococcal Polysaccharide-23 08/05/2012, 10/21/2014  . Zoster 09/05/2016   Pertinent  Health Maintenance Due  Topic Date Due  . FOOT EXAM  04/22/1951  . OPHTHALMOLOGY EXAM  04/22/1951  . DEXA SCAN  04/21/2006  . INFLUENZA VACCINE  05/21/2018  . HEMOGLOBIN A1C  12/09/2018  . PNA vac Low Risk Adult  Completed   Fall Risk  03/17/2018  Falls in the past year? No    Vitals:   06/18/18 1034  BP: (!) 160/100  Pulse: 70   Resp: 16  Temp: 98.4 F (36.9 C)  TempSrc: Oral  SpO2: 97%  Weight: 166 lb 12.8 oz (75.7 kg)  Height: 5' 1.5" (1.562 m)   Body mass index is 31.01 kg/m. Physical Exam  Constitutional: She is oriented to person, place, and time.  Obese,elderly in no acute distress   HENT:  Head: Normocephalic.  Mouth/Throat: Oropharynx is clear and moist. No oropharyngeal exudate.  Bilateral ear cerumen impaction Tm not visualized   Eyes: Pupils are equal, round, and reactive to light. Conjunctivae and EOM are normal. Right eye exhibits no discharge. Left eye exhibits no discharge. No scleral icterus.  Neck: Normal range of motion. No JVD present. No thyromegaly present.  Cardiovascular: Regular rhythm. Exam reveals no gallop and no friction rub.  Murmur heard. Pulmonary/Chest: Effort normal and breath sounds normal. No respiratory distress. She has no wheezes. She has no rales.  Abdominal: Soft. Bowel sounds are normal. She exhibits no distension. There is no tenderness. There is no rebound and no guarding.  Musculoskeletal: Normal range of motion. She exhibits no  edema, tenderness or deformity.  Lymphadenopathy:    She has no cervical adenopathy.  Neurological: She is oriented to person, place, and time.  Skin: Skin is warm and dry. No rash noted. No erythema. No pallor.  Psychiatric: She has a normal mood and affect. Her behavior is normal. Judgment and thought content normal.  Vitals reviewed.   Labs reviewed: Recent Labs    03/17/18 1227 03/24/18 1200 06/08/18 0840  NA 140  --  137  K 4.1  --  4.2  CL 102  --  102  CO2 29  --  26  GLUCOSE 87  --  120*  BUN 20  --  21  CREATININE 0.67  --  0.64  CALCIUM 11.1* 11.4* 10.8*   Recent Labs    03/17/18 1227 06/08/18 0840  AST 18 19  ALT 15 16  BILITOT 0.8 1.0  PROT 6.8 6.3   Recent Labs    03/17/18 1227 06/08/18 0840  WBC 9.4 6.1  NEUTROABS 6,232 3,209  HGB 14.4 13.8  HCT 41.8 41.1  MCV 96.3 96.3  PLT 294 281   No  results found for: TSH Lab Results  Component Value Date   HGBA1C 5.7 (H) 06/08/2018   Lab Results  Component Value Date   CHOL 159 06/08/2018   HDL 57 06/08/2018   LDLCALC 80 06/08/2018   TRIG 123 06/08/2018   CHOLHDL 2.8 06/08/2018    Significant Diagnostic Results in last 30 days:  No results found.  Assessment/Plan 1. Type 2 diabetes mellitus with other specified complication, without long-term current use of insulin  Lab Results  Component Value Date   HGBA1C 5.7 (H) 06/08/2018   CBG readings stable.continue metformin 500 mg tablet daily.On ASA and Statin for cardiovascular event prophylaxis.Hgb A1C one week prior 3 months follow up visit.will Reduce metformin if CBG's remains stable.   2. Essential hypertension B/p elevated during visit though states always elevated whenever she goes for Doctor visits.Asymptomatic.No medication adjustment this visit.Encourage to check B/P at home and keep a log. Notify provider for BP >150/90.continue on losartan 100 mg tablet daily,HCTZ 25 mg tablet daily and Norvasc 5 mg tablet daily.Increase Norvasc to 10 mg tablet daily if SBP still >150. Continue on ASA and Statin for prophylaxis. CBC,CMP and TSH level in 3 months.   3. Mixed hyperlipidemia LDL goal <70 continue on pravastatin 40 mg tablet daily.encouraged low carbohydrates,low fats and high vegetables diet.Also encouraged 30 minutes exercise at least three times per week.recheck lipid panel in 3 months.   4. Primary osteoarthritis involving multiple joints Current regimen effective.continue to monitor.   5. Impacted cerumen of left ear TM not visualized.Asymptomatic.OTC debrox 6.5 % otic solution instil 5 drops into left ear twice daily x 4 days then return 06/25/2018 for ear lavage.   Family/ staff Communication: Reviewed plan of care with patient.   Labs/tests ordered: CBC/diff,CMP,TSH,lipid panel and Hgb A1C in 3 months   Caesar Bookman, NP

## 2018-06-18 NOTE — Patient Instructions (Signed)
1.Instil debrox 6.5 % otic solution instil 5 drops into left ear twice daily x 4 days may use cotton ball to prevent drainage to ear. 2. Follow with [facility] on 06/25/2018 to flush your left ear.  3. Follow up in 3 months get labs drawn prior to visit

## 2018-06-19 ENCOUNTER — Telehealth: Payer: Self-pay | Admitting: Internal Medicine

## 2018-06-19 NOTE — Telephone Encounter (Signed)
I called the patient to schedule AWV, but there was no answer and no option to leave a message. VDM (DD) °

## 2018-06-25 ENCOUNTER — Ambulatory Visit (INDEPENDENT_AMBULATORY_CARE_PROVIDER_SITE_OTHER): Payer: Medicare HMO | Admitting: Family

## 2018-06-25 ENCOUNTER — Encounter: Payer: Self-pay | Admitting: Family

## 2018-06-25 VITALS — BP 138/80 | HR 76 | Ht 62.0 in | Wt 167.0 lb

## 2018-06-25 DIAGNOSIS — H6122 Impacted cerumen, left ear: Secondary | ICD-10-CM | POA: Diagnosis not present

## 2018-06-25 NOTE — Progress Notes (Signed)
Provider: Sylvan Lahm FNP-C  Kirt Boys, DO  Patient Care Team: Kirt Boys, DO as PCP - General (Internal Medicine)  Extended Emergency Contact Information Primary Emergency Contact: Nakaya, Mishkin Phone: [phone] Relation: Spouse   Goals of care: Advanced Directive information Advanced Directives 06/18/2018  Does Patient Have a Medical Advance Directive? Yes  Type of Advance Directive Healthcare Power of Attorney  Does patient want to make changes to medical advance directive? -  Copy of Healthcare Power of Attorney in Chart? Yes     Chief Complaint  Patient presents with  . Acute Visit    Left ear fullness     HPI:  Pt is a 77 y.o. female seen today at [facility] office for an acute visit for evaluation of left ear fullness.she denies any fever,chills ,cough,allergies or pain in the ears.she has instilled Debrox 6.5 % otic solution to left ear now here for cerumen removal.  Flu shot offered today but patient declined.     Past Medical History:  Diagnosis Date  . Arthritis   . DM (diabetes mellitus) (HCC)   . Hypercalcemia   . Hypercholesteremia   . Hypertension   . Rheumatoid arteritis    Past Surgical History:  Procedure Laterality Date  . bladder prolapse  2018   Dr. Cranston Neighbor  . carpal tunnel Bilateral 1998  . DILATION AND CURETTAGE OF UTERUS  1964   Dr. Alease Medina  . MENISCUS REPAIR Right 2010   Dr. Cranston Neighbor  . PARTIAL HYSTERECTOMY  1973   ovaries remain  . Stroke  1994   TIA  . TONSILLECTOMY  1956    Allergies  Allergen Reactions  . Bactrim [Sulfamethoxazole-Trimethoprim] Diarrhea  . Clindamycin/Lincomycin Rash and Diarrhea    Terrible stomach cramps per patient  . Oxycodone Nausea And Vomiting and Nausea Only  . Penicillins Rash, Hives and Swelling    Outpatient Encounter Medications as of 06/25/2018  Medication Sig  . allopurinol (ZYLOPRIM) 300 MG tablet Take 1 tablet (300 mg total) by mouth daily.  Marland Kitchen  amLODipine (NORVASC) 5 MG tablet Take 1 tablet (5 mg total) by mouth daily.  Marland Kitchen aspirin EC 325 MG tablet Take 325 mg by mouth daily.  Marland Kitchen BIOTIN 5000 PO Take 1 capsule by mouth daily.  . cholecalciferol (VITAMIN D) 1000 units tablet Take 2,000 Units by mouth daily.  . folic acid (FOLVITE) 400 MCG tablet Take 400 mcg by mouth daily.  . hydrochlorothiazide (HYDRODIURIL) 25 MG tablet Take 1 tablet (25 mg total) by mouth daily.  Marland Kitchen losartan (COZAAR) 100 MG tablet Take 100 mg by mouth daily.  . metFORMIN (GLUCOPHAGE) 500 MG tablet Take 1 tablet (500 mg total) by mouth daily.  . naproxen sodium (ALEVE) 220 MG tablet Take 220 mg by mouth daily as needed.  . pravastatin (PRAVACHOL) 40 MG tablet Take 1 tablet (40 mg total) by mouth daily.  . timolol (BETIMOL) 0.5 % ophthalmic solution Place 1 drop into both eyes daily.   No facility-administered encounter medications on file as of 06/25/2018.     Review of Systems  Constitutional: Negative for chills, fatigue and fever.  HENT: Positive for hearing loss. Negative for congestion, ear discharge, ear pain, rhinorrhea, sinus pressure, sinus pain, sneezing and sore throat.   Eyes: Negative for pain, discharge, redness and itching.  Respiratory: Negative for cough, chest tightness, shortness of breath and wheezing.   Skin: Negative for color change, pallor and rash.  Neurological: Negative for dizziness, light-headedness and headaches.  Immunization History  Administered Date(s) Administered  . Influenza Split 07/20/2012  . Influenza, High Dose Seasonal PF 07/06/2014, 07/04/2016, 07/17/2017  . Influenza, Quadrivalent, Recombinant, Inj, Pf 07/07/2013  . Influenza, Seasonal, Injecte, Preservative Fre 08/11/2015  . Pneumococcal Conjugate-13 04/06/2015, 09/05/2016  . Pneumococcal Polysaccharide-23 08/05/2012, 10/21/2014  . Zoster 09/05/2016   Pertinent  Health Maintenance Due  Topic Date Due  . FOOT EXAM  04/22/1951  . OPHTHALMOLOGY EXAM  04/22/1951  .  DEXA SCAN  04/21/2006  . INFLUENZA VACCINE  05/21/2018  . HEMOGLOBIN A1C  12/09/2018  . PNA vac Low Risk Adult  Completed   Fall Risk  03/17/2018  Falls in the past year? No   Functional Status Survey:    Vitals:   06/25/18 1513  BP: 138/80  Pulse: 76  SpO2: 97%  Weight: 167 lb (75.8 kg)  Height: 5\' 2"  (1.575 m)   Body mass index is 30.54 kg/m. Physical Exam  Constitutional: She is oriented to person, place, and time. She appears well-developed and well-nourished. No distress.  HENT:  Head: Normocephalic.  Right Ear: External ear normal.  Mouth/Throat: Oropharynx is clear and moist. No oropharyngeal exudate.  Left ear soft cerumen impaction.ear lavaged with warm water. Moderate amounts of cerumen removed. TM clear no signs of infections noted.Patient tolerated procedure well.   Eyes: Pupils are equal, round, and reactive to light. Conjunctivae are normal. Right eye exhibits no discharge. Left eye exhibits no discharge. No scleral icterus.  Neck: Normal range of motion.  Cardiovascular: Normal rate, regular rhythm, normal heart sounds and intact distal pulses. Exam reveals no gallop and no friction rub.  No murmur heard. Pulmonary/Chest: Effort normal and breath sounds normal. No respiratory distress. She has no wheezes. She has no rales.  Lymphadenopathy:    She has no cervical adenopathy.  Neurological: She is oriented to person, place, and time.  Skin: Skin is warm and dry. No rash noted. No erythema. No pallor.  Psychiatric: She has a normal mood and affect. Her behavior is normal. Judgment and thought content normal.  Vitals reviewed.   Labs reviewed: Recent Labs    03/17/18 1227 03/24/18 1200 06/08/18 0840  NA 140  --  137  K 4.1  --  4.2  CL 102  --  102  CO2 29  --  26  GLUCOSE 87  --  120*  BUN 20  --  21  CREATININE 0.67  --  0.64  CALCIUM 11.1* 11.4* 10.8*   Recent Labs    03/17/18 1227 06/08/18 0840  AST 18 19  ALT 15 16  BILITOT 0.8 1.0  PROT  6.8 6.3   Recent Labs    03/17/18 1227 06/08/18 0840  WBC 9.4 6.1  NEUTROABS 6,232 3,209  HGB 14.4 13.8  HCT 41.8 41.1  MCV 96.3 96.3  PLT 294 281   No results found for: TSH Lab Results  Component Value Date   HGBA1C 5.7 (H) 06/08/2018   Lab Results  Component Value Date   CHOL 159 06/08/2018   HDL 57 06/08/2018   LDLCALC 80 06/08/2018   TRIG 123 06/08/2018   CHOLHDL 2.8 06/08/2018    Significant Diagnostic Results in last 30 days:  No results found.  Assessment/Plan   Impacted cerumen of left ear Afebrile.moderate amounts of cerumen lavaged with warm water.TM clear without any signs of infections.she tolerated procedure well.continue to monitor.   Family/ staff Communication: Reviewed plan of care with patient.   Labs/tests ordered: None   Albin Duckett  Shelva Majestic, NP

## 2018-06-25 NOTE — Patient Instructions (Signed)
Earwax Buildup, Adult The ears produce a substance called earwax that helps keep bacteria out of the ear and protects the skin in the ear canal. Occasionally, earwax can build up in the ear and cause discomfort or hearing loss. What increases the risk? This condition is more likely to develop in people who:  Are female.  Are elderly.  Naturally produce more earwax.  Clean their ears often with cotton swabs.  Use earplugs often.  Use in-ear headphones often.  Wear hearing aids.  Have narrow ear canals.  Have earwax that is overly thick or sticky.  Have eczema.  Are dehydrated.  Have excess hair in the ear canal.  What are the signs or symptoms? Symptoms of this condition include:  Reduced or muffled hearing.  A feeling of fullness in the ear or feeling that the ear is plugged.  Fluid coming from the ear.  Ear pain.  Ear itch.  Ringing in the ear.  Coughing.  An obvious piece of earwax that can be seen inside the ear canal.  How is this diagnosed? This condition may be diagnosed based on:  Your symptoms.  Your medical history.  An ear exam. During the exam, your health care provider will look into your ear with an instrument called an otoscope.  You may have tests, including a hearing test. How is this treated? This condition may be treated by:  Using ear drops to soften the earwax.  Having the earwax removed by a health care provider. The health care provider may: ? Flush the ear with water. ? Use an instrument that has a loop on the end (curette). ? Use a suction device.  Surgery to remove the wax buildup. This may be done in severe cases.  Follow these instructions at home:  Take over-the-counter and prescription medicines only as told by your health care provider.  Do not put any objects, including cotton swabs, into your ear. You can clean the opening of your ear canal with a washcloth or facial tissue.  Follow instructions from your health  care provider about cleaning your ears. Do not over-clean your ears.  Drink enough fluid to keep your urine clear or pale yellow. This will help to thin the earwax.  Keep all follow-up visits as told by your health care provider. If earwax builds up in your ears often or if you use hearing aids, consider seeing your health care provider for routine, preventive ear cleanings. Ask your health care provider how often you should schedule your cleanings.  If you have hearing aids, clean them according to instructions from the manufacturer and your health care provider. Contact a health care provider if:  You have ear pain.  You develop a fever.  You have blood, pus, or other fluid coming from your ear.  You have hearing loss.  You have ringing in your ears that does not go away.  Your symptoms do not improve with treatment.  You feel like the room is spinning (vertigo). Summary  Earwax can build up in the ear and cause discomfort or hearing loss.  The most common symptoms of this condition include reduced or muffled hearing and a feeling of fullness in the ear or feeling that the ear is plugged.  This condition may be diagnosed based on your symptoms, your medical history, and an ear exam.  This condition may be treated by using ear drops to soften the earwax or by having the earwax removed by a health care provider.  Do   not put any objects, including cotton swabs, into your ear. You can clean the opening of your ear canal with a washcloth or facial tissue. This information is not intended to replace advice given to you by your health care provider. Make sure you discuss any questions you have with your health care provider. Document Released: 11/14/2004 Document Revised: 12/18/2016 Document Reviewed: 12/18/2016 Elsevier Interactive Patient Education  2018 [employer]vier Inc[employer]  

## 2018-06-25 NOTE — Addendum Note (Signed)
Addended by: Maurice Small on: 06/25/2018 04:10 PM   Modules accepted: Orders

## 2018-06-30 ENCOUNTER — Encounter: Payer: Self-pay | Admitting: Nurse Practitioner

## 2018-06-30 DIAGNOSIS — R69 Illness, unspecified: Secondary | ICD-10-CM | POA: Diagnosis not present

## 2018-07-06 ENCOUNTER — Other Ambulatory Visit: Payer: Self-pay | Admitting: Internal Medicine

## 2018-07-10 DIAGNOSIS — H524 Presbyopia: Secondary | ICD-10-CM | POA: Diagnosis not present

## 2018-07-10 DIAGNOSIS — H401331 Pigmentary glaucoma, bilateral, mild stage: Secondary | ICD-10-CM | POA: Diagnosis not present

## 2018-07-10 LAB — HM DIABETES EYE EXAM

## 2018-09-08 ENCOUNTER — Other Ambulatory Visit: Payer: Self-pay

## 2018-09-08 DIAGNOSIS — I10 Essential (primary) hypertension: Secondary | ICD-10-CM

## 2018-09-08 MED ORDER — AMLODIPINE BESYLATE 5 MG PO TABS: 5.0000 mg | ORAL_TABLET | Freq: Every day | ORAL | 0 refills | Status: DC

## 2018-09-21 ENCOUNTER — Other Ambulatory Visit: Payer: Medicare HMO

## 2018-09-21 DIAGNOSIS — I10 Essential (primary) hypertension: Secondary | ICD-10-CM

## 2018-09-21 DIAGNOSIS — M8949 Other hypertrophic osteoarthropathy, multiple sites: Secondary | ICD-10-CM

## 2018-09-21 DIAGNOSIS — E1169 Type 2 diabetes mellitus with other specified complication: Secondary | ICD-10-CM | POA: Diagnosis not present

## 2018-09-21 DIAGNOSIS — M150 Primary generalized (osteo)arthritis: Secondary | ICD-10-CM

## 2018-09-21 DIAGNOSIS — E782 Mixed hyperlipidemia: Secondary | ICD-10-CM

## 2018-09-21 DIAGNOSIS — M159 Polyosteoarthritis, unspecified: Secondary | ICD-10-CM

## 2018-09-22 LAB — HEMOGLOBIN A1C
HEMOGLOBIN A1C: 5.8 %{Hb} — AB (ref <5.7)
Mean Plasma Glucose: 120 (calc)
eAG (mmol/L): 6.6 (calc)

## 2018-09-22 LAB — CBC WITH DIFFERENTIAL/PLATELET
Basophils Absolute: 67 cells/uL (ref 0–200)
Basophils Relative: 0.8 %
EOS ABS: 176 {cells}/uL (ref 15–500)
Eosinophils Relative: 2.1 %
HEMATOCRIT: 41 % (ref 35.0–45.0)
HEMOGLOBIN: 13.9 g/dL (ref 11.7–15.5)
LYMPHS ABS: 2604 {cells}/uL (ref 850–3900)
MCH: 32.6 pg (ref 27.0–33.0)
MCHC: 33.9 g/dL (ref 32.0–36.0)
MCV: 96 fL (ref 80.0–100.0)
MPV: 8.9 fL (ref 7.5–12.5)
Monocytes Relative: 7.4 %
NEUTROS ABS: 4931 {cells}/uL (ref 1500–7800)
Neutrophils Relative %: 58.7 %
Platelets: 275 10*3/uL (ref 140–400)
RBC: 4.27 10*6/uL (ref 3.80–5.10)
RDW: 13.3 % (ref 11.0–15.0)
Total Lymphocyte: 31 %
WBC: 8.4 10*3/uL (ref 3.8–10.8)
WBCMIX: 622 {cells}/uL (ref 200–950)

## 2018-09-22 LAB — COMPLETE METABOLIC PANEL WITH GFR
AG Ratio: 2.2 (calc) (ref 1.0–2.5)
ALT: 16 U/L (ref 6–29)
AST: 18 U/L (ref 10–35)
Albumin: 4.4 g/dL (ref 3.6–5.1)
Alkaline phosphatase (APISO): 87 U/L (ref 33–130)
BUN: 20 mg/dL (ref 7–25)
CO2: 29 mmol/L (ref 20–32)
CREATININE: 0.65 mg/dL (ref 0.60–0.93)
Calcium: 11.1 mg/dL — ABNORMAL HIGH (ref 8.6–10.4)
Chloride: 100 mmol/L (ref 98–110)
GFR, EST NON AFRICAN AMERICAN: 86 mL/min/{1.73_m2} (ref >=60)
GFR, Est African American: 99 mL/min/{1.73_m2} (ref >=60)
GLUCOSE: 116 mg/dL — AB (ref 65–99)
Globulin: 2 g/dL (calc) (ref 1.9–3.7)
POTASSIUM: 4 mmol/L (ref 3.5–5.3)
Sodium: 138 mmol/L (ref 135–146)
Total Bilirubin: 0.8 mg/dL (ref 0.2–1.2)
Total Protein: 6.4 g/dL (ref 6.1–8.1)

## 2018-09-22 LAB — LIPID PANEL
Cholesterol: 195 mg/dL (ref <200)
HDL: 60 mg/dL (ref >50)
LDL CHOLESTEROL (CALC): 105 mg/dL — AB
NON-HDL CHOLESTEROL (CALC): 135 mg/dL — AB (ref <130)
TRIGLYCERIDES: 181 mg/dL — AB (ref <150)
Total CHOL/HDL Ratio: 3.3 (calc) (ref <5.0)

## 2018-09-22 LAB — TSH: TSH: 2.31 mIU/L (ref 0.40–4.50)

## 2018-09-23 ENCOUNTER — Ambulatory Visit (INDEPENDENT_AMBULATORY_CARE_PROVIDER_SITE_OTHER): Payer: Medicare HMO | Admitting: Nurse Practitioner

## 2018-09-23 ENCOUNTER — Ambulatory Visit (INDEPENDENT_AMBULATORY_CARE_PROVIDER_SITE_OTHER): Payer: Medicare HMO

## 2018-09-23 ENCOUNTER — Encounter: Payer: Self-pay | Admitting: Nurse Practitioner

## 2018-09-23 VITALS — BP 150/72 | HR 67 | Temp 97.4°F | Ht 62.0 in | Wt 168.0 lb

## 2018-09-23 DIAGNOSIS — I10 Essential (primary) hypertension: Secondary | ICD-10-CM

## 2018-09-23 DIAGNOSIS — M8949 Other hypertrophic osteoarthropathy, multiple sites: Secondary | ICD-10-CM

## 2018-09-23 DIAGNOSIS — E2839 Other primary ovarian failure: Secondary | ICD-10-CM

## 2018-09-23 DIAGNOSIS — M150 Primary generalized (osteo)arthritis: Secondary | ICD-10-CM

## 2018-09-23 DIAGNOSIS — Z0000 Encounter for general adult medical examination without abnormal findings: Secondary | ICD-10-CM

## 2018-09-23 DIAGNOSIS — E782 Mixed hyperlipidemia: Secondary | ICD-10-CM | POA: Diagnosis not present

## 2018-09-23 DIAGNOSIS — M159 Polyosteoarthritis, unspecified: Secondary | ICD-10-CM

## 2018-09-23 DIAGNOSIS — E1169 Type 2 diabetes mellitus with other specified complication: Secondary | ICD-10-CM | POA: Diagnosis not present

## 2018-09-23 MED ORDER — ZOSTER VAC RECOMB ADJUVANTED 50 MCG/0.5ML IM SUSR: 0.5000 mL | Freq: Once | INTRAMUSCULAR | 1 refills | Status: AC

## 2018-09-23 NOTE — Progress Notes (Signed)
Careteam: Patient Care Team: Sharon Seller, NP as PCP - General (Geriatric Medicine)  Advanced Directive information    Allergies  Allergen Reactions  . Bactrim [Sulfamethoxazole-Trimethoprim] Diarrhea  . Clindamycin/Lincomycin Rash and Diarrhea    Terrible stomach cramps per patient  . Oxycodone Nausea And Vomiting and Nausea Only  . Penicillins Rash, Hives and Swelling    Chief Complaint  Patient presents with  . Medical Management of Chronic Issues    3 month follow-up, discuss labs (copy printed). AWV completed today, MMSE 29/30, passed clock drawing. Discuss knee injection   . Health Maintenance    DM foot exam due     HPI: Patient is a 77 y.o. female seen in the office today for routine follow up.    Type 2 DM -she is on metformin 500 mg tablet daily. No hypoglycemia.   Hypertension- reports white coat syndrome. Elevated on recheck. Varies at home. Does not have blood pressure log, 120-130/70-80s.   Hyperlipidemia - continues to take pravastatin 40 mg tablet daily and watches her diet however cooking could improve. Does not wish to make changes to medication, aware goal LDL <70 for cardiovascular risk.    Osteoarthritis - right knee pain- request injections- used to get those in the past, last injection was in January. Had imagining back in May. Hx of OA with meniscus tear, was repaired in 2010 but feels like it tore again. Did not have an injury but feels like due to poor body mechanics caused injury- climbing out of sand trap, moving furniture.  Uses aleve occasionally due to pain. Maybe will use once weekly. Pain currently 6/10, aching.   Reports previous PCP noted elevated calcium but no additional test were done.  Review of Systems:  Review of Systems  Constitutional: Negative for chills, fever and weight loss.  HENT: Negative for tinnitus.   Respiratory: Negative for cough, sputum production and shortness of breath.   Cardiovascular: Negative for  chest pain, palpitations and leg swelling.  Gastrointestinal: Negative for abdominal pain, constipation, diarrhea and heartburn.  Genitourinary: Negative for dysuria, frequency and urgency.  Musculoskeletal: Positive for joint pain (right knee pain). Negative for back pain, falls and myalgias.  Skin: Negative.   Neurological: Negative for dizziness and headaches.  Psychiatric/Behavioral: Negative for depression and memory loss. The patient does not have insomnia.     Past Medical History:  Diagnosis Date  . Arthritis   . DM (diabetes mellitus) (HCC)   . Hypercalcemia   . Hypercholesteremia   . Hypertension   . Rheumatoid arteritis (HCC)    Past Surgical History:  Procedure Laterality Date  . bladder prolapse  2018   Dr. Cranston Neighbor  . carpal tunnel Bilateral 1998  . DILATION AND CURETTAGE OF UTERUS  1964   Dr. Alease Medina  . MENISCUS REPAIR Right 2010   Dr. Cranston Neighbor  . PARTIAL HYSTERECTOMY  1973   ovaries remain  . Stroke  1994   TIA  . TONSILLECTOMY  1956   Social History:   reports that she has quit smoking. She has never used smokeless tobacco. She reports that she drinks about 7.0 standard drinks of alcohol per week. She reports that she does not use drugs.  Family History  Problem Relation Age of Onset  . Hypertension Son   . Hypertension Son   . Hypertension Son     Medications: Patient's Medications  New Prescriptions   No medications on file  Previous Medications   ALLOPURINOL (ZYLOPRIM)  300 MG TABLET    TAKE 1 TABLET DAILY   AMLODIPINE (NORVASC) 5 MG TABLET    Take 1 tablet (5 mg total) by mouth daily.   ASPIRIN EC 325 MG TABLET    Take 325 mg by mouth daily.   BIOTIN 5000 PO    Take 1 capsule by mouth daily.   CHOLECALCIFEROL (VITAMIN D) 1000 UNITS TABLET    Take 2,000 Units by mouth daily.   FOLIC ACID (FOLVITE) 400 MCG TABLET    Take 400 mcg by mouth daily.   HYDROCHLOROTHIAZIDE (HYDRODIURIL) 25 MG TABLET    Take 1 tablet (25 mg total) by  mouth daily.   LOSARTAN (COZAAR) 100 MG TABLET    Take 100 mg by mouth daily.   METFORMIN (GLUCOPHAGE) 500 MG TABLET    Take 1 tablet (500 mg total) by mouth daily.   NAPROXEN SODIUM (ALEVE) 220 MG TABLET    Take 220 mg by mouth daily as needed.   PRAVASTATIN (PRAVACHOL) 40 MG TABLET    Take 1 tablet (40 mg total) by mouth daily.   TIMOLOL (BETIMOL) 0.5 % OPHTHALMIC SOLUTION    Place 1 drop into both eyes daily.   ZOSTER VACCINE ADJUVANTED (SHINGRIX) INJECTION    Inject 0.5 mLs into the muscle once for 1 dose.  Modified Medications   No medications on file  Discontinued Medications   No medications on file     Physical Exam:  Vitals:   09/23/18 0937  BP: (!) 150/72  Pulse: 67  Temp: (!) 97.4 F (36.3 C)  TempSrc: Oral  SpO2: 96%  Weight: 168 lb (76.2 kg)  Height: 5\' 2"  (1.575 m)   Body mass index is 30.73 kg/m.  Physical Exam  Constitutional: She is oriented to person, place, and time. She appears well-developed and well-nourished.  HENT:  Mouth/Throat: Oropharynx is clear and moist. No oropharyngeal exudate.  MMM; no oral thrush  Eyes: Pupils are equal, round, and reactive to light. No scleral icterus.  Neck: Neck supple. Carotid bruit is not present. No tracheal deviation present. No thyromegaly present.  Cardiovascular: Normal rate, regular rhythm and intact distal pulses. Exam reveals no gallop and no friction rub.  Murmur (1/6 SEM) heard. No LE edema b/l. no calf TTP.   Pulmonary/Chest: Effort normal and breath sounds normal. No stridor. No respiratory distress. She has no wheezes. She has no rales.  Abdominal: Soft. Normal appearance and bowel sounds are normal. There is no hepatomegaly. There is no rigidity.  obese  Musculoskeletal: She exhibits edema.  Reduced right knee ROM with tender laterally; right proximal fibular head tender  Lymphadenopathy:    She has no cervical adenopathy.  Neurological: She is alert and oriented to person, place, and time. She has  normal reflexes.  Skin: Skin is warm and dry. No rash noted.  Psychiatric: She has a normal mood and affect. Her behavior is normal. Judgment and thought content normal.    Labs reviewed: Basic Metabolic Panel: Recent Labs    03/17/18 1227 03/24/18 1200 06/08/18 0840 09/21/18 0934  NA 140  --  137 138  K 4.1  --  4.2 4.0  CL 102  --  102 100  CO2 29  --  26 29  GLUCOSE 87  --  120* 116*  BUN 20  --  21 20  CREATININE 0.67  --  0.64 0.65  CALCIUM 11.1* 11.4* 10.8* 11.1*  TSH  --   --   --  2.31  Liver Function Tests: Recent Labs    03/17/18 1227 06/08/18 0840 09/21/18 0934  AST 18 19 18   ALT 15 16 16   BILITOT 0.8 1.0 0.8  PROT 6.8 6.3 6.4   No results for input(s): LIPASE, AMYLASE in the last 8760 hours. No results for input(s): AMMONIA in the last 8760 hours. CBC: Recent Labs    03/17/18 1227 06/08/18 0840 09/21/18 0934  WBC 9.4 6.1 8.4  NEUTROABS 6,232 3,209 4,931  HGB 14.4 13.8 13.9  HCT 41.8 41.1 41.0  MCV 96.3 96.3 96.0  PLT 294 281 275   Lipid Panel: Recent Labs    06/08/18 0840 09/21/18 0934  CHOL 159 195  HDL 57 60  LDLCALC 80 105*  TRIG 123 181*  CHOLHDL 2.8 3.3   TSH: Recent Labs    09/21/18 0934  TSH 2.31   A1C: Lab Results  Component Value Date   HGBA1C 5.8 (H) 09/21/2018     Assessment/Plan 1. Hypercalcemia -not currently on supplement, will get additional labs today.  - PTH, Intact and Calcium - Calcium, ionized - Vitamin D, 25-hydroxy  2. Type 2 diabetes mellitus with other specified complication, without long-term current use of insulin (HCC) -A1c at goal, continue on metformin 500 mg daily with dietary modifications.   3. Essential hypertension White coat syndrome, improved readings at home.   4. Mixed hyperlipidemia Not at goal. Discussed dietary modifications, she does not wish to change cholesterol medications at this time. Will follow up lipids at next visit.   5. Primary osteoarthritis involving multiple  joints Worsening right knee pain. Instructed that she can use OTC tylenol routinely. Discussed getting appt with Dr 14/02/19 if needed for injection. She has already had xray in May.  Next appt: 3 months  Zarin Hagmann K. Renato Gails  [facility] [phone]

## 2018-09-23 NOTE — Patient Instructions (Signed)
Follow up in 3 months we will do fasting labs this day  Fat and Cholesterol Restricted Diet Getting too much fat and cholesterol in your diet may cause health problems. Following this diet helps keep your fat and cholesterol at normal levels. This can keep you from getting sick. What types of fat should I choose?  Choose monosaturated and polyunsaturated fats. These are found in foods such as olive oil, canola oil, flaxseeds, walnuts, almonds, and seeds.  Eat more omega-3 fats. Good choices include salmon, mackerel, sardines, tuna, flaxseed oil, and ground flaxseeds.  Limit saturated fats. These are in animal products such as meats, butter, and cream. They can also be in plant products such as palm oil, palm kernel oil, and coconut oil.  Avoid foods with partially hydrogenated oils in them. These contain trans fats. Examples of foods that have trans fats are stick margarine, some tub margarines, cookies, crackers, and other baked goods. What general guidelines do I need to follow?  Check food labels. Look for the words "trans fat" and "saturated fat."  When preparing a meal: ? Fill half of your plate with vegetables and green salads. ? Fill one fourth of your plate with whole grains. Look for the word "whole" as the first word in the ingredient list. ? Fill one fourth of your plate with lean protein foods.  Eat more foods that have fiber, like apples, carrots, beans, peas, and barley.  Eat more home-cooked foods. Eat less at restaurants and buffets.  Limit or avoid alcohol.  Limit foods high in starch and sugar.  Limit fried foods.  Cook foods without frying them. Baking, boiling, grilling, and broiling are all great options.  Lose weight if you are overweight. Losing even a small amount of weight can help your overall health. It can also help prevent diseases such as diabetes and heart disease. What foods can I eat? Grains Whole grains, such as whole wheat or whole grain breads,  crackers, cereals, and pasta. Unsweetened oatmeal, bulgur, barley, quinoa, or brown rice. Corn or whole wheat flour tortillas. Vegetables Fresh or frozen vegetables (raw, steamed, roasted, or grilled). Green salads. Fruits All fresh, canned (in natural juice), or frozen fruits. Meat and Other Protein Products Ground beef (85% or leaner), grass-fed beef, or beef trimmed of fat. Skinless chicken or [place]. Ground chicken or [place]. Pork trimmed of fat. All fish and seafood. Eggs. Dried beans, peas, or lentils. Unsalted nuts or seeds. Unsalted canned or dry beans. Dairy Low-fat dairy products, such as skim or 1% milk, 2% or reduced-fat cheeses, low-fat ricotta or cottage cheese, or plain low-fat yogurt. Fats and Oils Tub margarines without trans fats. Light or reduced-fat mayonnaise and salad dressings. Avocado. Olive, canola, sesame, or safflower oils. Natural peanut or almond butter (choose ones without added sugar and oil). The items listed above may not be a complete list of recommended foods or beverages. Contact your dietitian for more options. What foods are not recommended? Grains White bread. White pasta. White rice. Cornbread. Bagels, pastries, and croissants. Crackers that contain trans fat. Vegetables White potatoes. Corn. Creamed or fried vegetables. Vegetables in a cheese sauce. Fruits Dried fruits. Canned fruit in light or heavy syrup. Fruit juice. Meat and Other Protein Products Fatty cuts of meat. Ribs, chicken wings, bacon, sausage, bologna, salami, chitterlings, fatback, hot dogs, bratwurst, and packaged luncheon meats. Liver and organ meats. Dairy Whole or 2% milk, cream, half-and-half, and cream cheese. Whole milk cheeses. Whole-fat or sweetened yogurt. Full-fat cheeses. Nondairy creamers and  whipped toppings. Processed cheese, cheese spreads, or cheese curds. Sweets and Desserts Corn syrup, sugars, honey, and molasses. Candy. Jam and jelly. Syrup. Sweetened cereals.  Cookies, pies, cakes, donuts, muffins, and ice cream. Fats and Oils Butter, stick margarine, lard, shortening, ghee, or bacon fat. Coconut, palm kernel, or palm oils. Beverages Alcohol. Sweetened drinks (such as sodas, lemonade, and fruit drinks or punches). The items listed above may not be a complete list of foods and beverages to avoid. Contact your dietitian for more information. This information is not intended to replace advice given to you by your health care provider. Make sure you discuss any questions you have with your health care provider. Document Released: 04/07/2012 Document Revised: 06/13/2016 Document Reviewed: 01/06/2014 Elsevier Interactive Patient Education  [employer].

## 2018-09-23 NOTE — Progress Notes (Signed)
Subjective:   Debra Madden is a 77 y.o. female who presents for Medicare Annual Initial preventive examination.    Objective:     Vitals: BP (!) 150/72 (BP Location: Left Arm, Patient Position: Sitting)   Pulse 67   Temp (!) 97.4 F (36.3 C) (Oral)   Ht 5\' 2"  (1.575 m)   Wt 168 lb (76.2 kg)   SpO2 96%   BMI 30.73 kg/m   Body mass index is 30.73 kg/m.  Provider notified of BP  Advanced Directives 09/23/2018 06/18/2018 03/17/2018  Does Patient Have a Medical Advance Directive? Yes Yes Yes  Type of Advance Directive Healthcare Power of [employer] Power of [employer] Power of Attorney  Does patient want to make changes to medical advance directive? No - Patient declined - No - Patient declined  Copy of Healthcare Power of Attorney in Chart? Yes - validated most recent copy scanned in chart (See row information) Yes No - copy requested    Tobacco Social History   Tobacco Use  Smoking Status Former Smoker  Smokeless Tobacco Never Used  Tobacco Comment   Quit at age 50     Counseling given: Not Answered Comment: Quit at age 56   Clinical Intake:  Pre-visit preparation completed: No  Pain : 0-10 Pain Score: 6  Pain Type: Chronic pain Pain Location: Knee Pain Orientation: Right Pain Descriptors / Indicators: Aching Pain Onset: More than a month ago Pain Frequency: Intermittent     Diabetes: No  How often do you need to have someone help you when you read instructions, pamphlets, or other written materials from your doctor or pharmacy?: 1 - Never What is the last grade level you completed in school?: Bachelor  Interpreter Needed?: No  Information entered by :: [name], RN  Past Medical History:  Diagnosis Date  . Arthritis   . DM (diabetes mellitus) (HCC)   . Hypercalcemia   . Hypercholesteremia   . Hypertension   . Rheumatoid arteritis (HCC)    Past Surgical History:  Procedure Laterality Date  . bladder prolapse  2018   Dr.  Cranston Neighbor  . carpal tunnel Bilateral 1998  . DILATION AND CURETTAGE OF UTERUS  1964   Dr. Alease Medina  . MENISCUS REPAIR Right 2010   Dr. Cranston Neighbor  . PARTIAL HYSTERECTOMY  1973   ovaries remain  . Stroke  1994   TIA  . TONSILLECTOMY  1956   Family History  Problem Relation Age of Onset  . Hypertension Son   . Hypertension Son   . Hypertension Son    Social History   Socioeconomic History  . Marital status: Married    Spouse name: Not on file  . Number of children: Not on file  . Years of education: Not on file  . Highest education level: Not on file  Occupational History  . Not on file  Social Needs  . Financial resource strain: Not hard at all  . Food insecurity:    Worry: Never true    Inability: Never true  . Transportation needs:    Medical: No    Non-medical: No  Tobacco Use  . Smoking status: Former Games developer  . Smokeless tobacco: Never Used  . Tobacco comment: Quit at age 69  Substance and Sexual Activity  . Alcohol use: Yes    Alcohol/week: 7.0 standard drinks    Types: 7 Glasses of wine per week  . Drug use: Never  . Sexual activity: Not Currently  Lifestyle  . Physical activity:    Days per week: 0 days    Minutes per session: 0 min  . Stress: Only a little  Relationships  . Social connections:    Talks on phone: Three times a week    Gets together: Once a week    Attends religious service: More than 4 times per year    Active member of club or organization: No    Attends meetings of clubs or organizations: Never    Relationship status: Married  Other Topics Concern  . Not on file  Social History Narrative   Social History      Diet? Meats, fish, salads, occasionally shellfish, eggs, vegs, broc, cauliflower, brusel sprouts, green beans, mixed vegs, potatoes, some pasta/rice.       Do you drink/eat things with caffeine? Coffee- 1 1/2 cup per day      Marital status?           married                         What year were you  married? 1964      Do you live in a house, apartment, assisted living, condo, trailer, etc.? house      Is it one or more stories? One- no stairs      How many persons live in your home? 2      Do you have any pets in your home? (please list) no      Highest level of education completed? 4 year college degree      Current or past profession: teacher      Do you exercise?         yes                             Type & how often? Walk, water aerobics      Advanced Directives      Do you have a living will? yes      Do you have a DNR form?        no                          If not, do you want to discuss one?      Do you have signed POA/HPOA for forms? yes      Functional Status      Do you have difficulty bathing or dressing yourself? no      Do you have difficulty preparing food or eating? no      Do you have difficulty managing your medications? no      Do you have difficulty managing your finances? no      Do you have difficulty affording your medications? no    Outpatient Encounter Medications as of 09/23/2018  Medication Sig  . allopurinol (ZYLOPRIM) 300 MG tablet TAKE 1 TABLET DAILY  . amLODipine (NORVASC) 5 MG tablet Take 1 tablet (5 mg total) by mouth daily.  Marland Kitchen aspirin EC 325 MG tablet Take 325 mg by mouth daily.  Marland Kitchen BIOTIN 5000 PO Take 1 capsule by mouth daily.  . cholecalciferol (VITAMIN D) 1000 units tablet Take 2,000 Units by mouth daily.  . folic acid (FOLVITE) 400 MCG tablet Take 400 mcg by mouth daily.  . hydrochlorothiazide (HYDRODIURIL) 25 MG tablet Take 1 tablet (25 mg total) by mouth daily.  Marland Kitchen  losartan (COZAAR) 100 MG tablet Take 100 mg by mouth daily.  . metFORMIN (GLUCOPHAGE) 500 MG tablet Take 1 tablet (500 mg total) by mouth daily.  . naproxen sodium (ALEVE) 220 MG tablet Take 220 mg by mouth daily as needed.  . pravastatin (PRAVACHOL) 40 MG tablet Take 1 tablet (40 mg total) by mouth daily.  . timolol (BETIMOL) 0.5 % ophthalmic solution Place 1  drop into both eyes daily.   No facility-administered encounter medications on file as of 09/23/2018.     Activities of Daily Living In your present state of health, do you have any difficulty performing the following activities: 09/23/2018  Hearing? N  Vision? Y  Difficulty concentrating or making decisions? N  Walking or climbing stairs? N  Dressing or bathing? N  Doing errands, shopping? N  Preparing Food and eating ? N  Using the Toilet? N  In the past six months, have you accidently leaked urine? Y  Do you have problems with loss of bowel control? N  Managing your Medications? N  Managing your Finances? N  Housekeeping or managing your Housekeeping? N    Patient Care Team: Sharon Seller, NP as PCP - General (Geriatric Medicine)    Assessment:   This is a routine wellness examination for Debra Madden.  Exercise Activities and Dietary recommendations Current Exercise Habits: The patient does not participate in regular exercise at present, Exercise limited by: None identified  Goals   None     Fall Risk Fall Risk  09/23/2018 03/17/2018  Falls in the past year? 0 No  Number falls in past yr: 0 -  Injury with Fall? 0 -   Is the patient's home free of loose throw rugs in walkways, pet beds, electrical cords, etc?   yes      Grab bars in the bathroom? no      Handrails on the stairs?   yes      Adequate lighting?   yes  Depression Screen PHQ 2/9 Scores 09/23/2018 03/17/2018  PHQ - 2 Score 0 0     Cognitive Function MMSE - Mini Mental State Exam 09/23/2018  Orientation to time 5  Orientation to Place 5  Registration 3  Attention/ Calculation 5  Recall 2  Language- name 2 objects 2  Language- repeat 1  Language- follow 3 step command 3  Language- read & follow direction 1  Write a sentence 1  Copy design 1  Total score 29        Immunization History  Administered Date(s) Administered  . Influenza Split 07/20/2012  . Influenza, High Dose Seasonal PF  07/06/2014, 07/04/2016, 07/17/2017  . Influenza, Quadrivalent, Recombinant, Inj, Pf 07/07/2013  . Influenza, Seasonal, Injecte, Preservative Fre 08/11/2015  . Pneumococcal Conjugate-13 04/06/2015, 09/05/2016  . Pneumococcal Polysaccharide-23 08/05/2012, 10/21/2014  . Zoster 09/05/2016    Qualifies for Shingles Vaccine? Yes, educated and ordered to pharmacy  Screening Tests Health Maintenance  Topic Date Due  . FOOT EXAM  [birth year]/1951  . TETANUS/TDAP  04/21/1960  . DEXA SCAN  04/21/2006  . INFLUENZA VACCINE  05/21/2018  . HEMOGLOBIN A1C  03/23/2019  . OPHTHALMOLOGY EXAM  07/11/2019  . PNA vac Low Risk Adult  Completed    Cancer Screenings: Lung: Low Dose CT Chest recommended if Age 37-80 years, 30 pack-year currently smoking OR have quit w/in 15years. Patient does not qualify. Breast:  Up to date on Mammogram? Yes   Up to date of Bone Density/Dexa? No, referral sent Colorectal: up to date  Additional Screenings:  Hepatitis C Screening: declined TDAP due: declined    Plan:    I have personally reviewed and addressed the Medicare Annual Wellness questionnaire and have noted the following in the patient's chart:  A. Medical and social history B. Use of alcohol, tobacco or illicit drugs  C. Current medications and supplements D. Functional ability and status E.  Nutritional status F.  Physical activity G. Advance directives H. List of other physicians I.  Hospitalizations, surgeries, and ER visits in previous 12 months J.  Vitals K. Screenings to include hearing, vision, cognitive, depression L. Referrals and appointments - none  In addition, I have reviewed and discussed with patient certain preventive protocols, quality metrics, and best practice recommendations. A written personalized care plan for preventive services as well as general preventive health recommendations were provided to patient.  See attached scanned questionnaire for additional information.   Signed,     [name], RN Nurse Health Advisor  Patient Concerns: R knee pain-requested cortisone shots

## 2018-09-23 NOTE — Addendum Note (Signed)
Addended by: [name] E on: 09/23/2018 10:02 AM   Modules accepted: Level of Service

## 2018-09-23 NOTE — Patient Instructions (Addendum)
Debra Madden , Thank you for taking time to come for your Medicare Wellness Visit. I appreciate your ongoing commitment to your health goals. Please review the following plan we discussed and let me know if I can assist you in the future.   Screening recommendations/referrals: Colonoscopy excluded, over age 77 Mammogram excluded, over age 27 Bone Density due, referral sent to the breast center Recommended yearly ophthalmology/optometry visit for glaucoma screening and checkup Recommended yearly dental visit for hygiene and checkup  Vaccinations: Influenza vaccine up to date Pneumococcal vaccine up to date, completed Tdap vaccine due, declined Shingles vaccine due, ordered to pharmacy    Advanced directives: in chart  Conditions/risks identified: none  Next appointment: [name], RN 09/27/2019 @ 8:30am   Preventive Care 65 Years and Older, Female Preventive care refers to lifestyle choices and visits with your health care provider that can promote health and wellness. What does preventive care include?  A yearly physical exam. This is also called an annual well check.  Dental exams once or twice a year.  Routine eye exams. Ask your health care provider how often you should have your eyes checked.  Personal lifestyle choices, including:  Daily care of your teeth and gums.  Regular physical activity.  Eating a healthy diet.  Avoiding tobacco and drug use.  Limiting alcohol use.  Practicing safe sex.  Taking low-dose aspirin every day.  Taking vitamin and mineral supplements as recommended by your health care provider. What happens during an annual well check? The services and screenings done by your health care provider during your annual well check will depend on your age, overall health, lifestyle risk factors, and family history of disease. Counseling  Your health care provider may ask you questions about your:  Alcohol use.  Tobacco use.  Drug  use.  Emotional well-being.  Home and relationship well-being.  Sexual activity.  Eating habits.  History of falls.  Memory and ability to understand (cognition).  Work and work Astronomer.  Reproductive health. Screening  You may have the following tests or measurements:  Height, weight, and BMI.  Blood pressure.  Lipid and cholesterol levels. These may be checked every 5 years, or more frequently if you are over 76 years old.  Skin check.  Lung cancer screening. You may have this screening every year starting at age 69 if you have a 30-pack-year history of smoking and currently smoke or have quit within the past 15 years.  Fecal occult blood test (FOBT) of the stool. You may have this test every year starting at age 56.  Flexible sigmoidoscopy or colonoscopy. You may have a sigmoidoscopy every 5 years or a colonoscopy every 10 years starting at age 51.  Hepatitis C blood test.  Hepatitis B blood test.  Sexually transmitted disease (STD) testing.  Diabetes screening. This is done by checking your blood sugar (glucose) after you have not eaten for a while (fasting). You may have this done every 1-3 years.  Bone density scan. This is done to screen for osteoporosis. You may have this done starting at age 60.  Mammogram. This may be done every 1-2 years. Talk to your health care provider about how often you should have regular mammograms. Talk with your health care provider about your test results, treatment options, and if necessary, the need for more tests. Vaccines  Your health care provider may recommend certain vaccines, such as:  Influenza vaccine. This is recommended every year.  Tetanus, diphtheria, and acellular pertussis (Tdap, Td)  vaccine. You may need a Td booster every 10 years.  Zoster vaccine. You may need this after age 35.  Pneumococcal 13-valent conjugate (PCV13) vaccine. One dose is recommended after age 32.  Pneumococcal polysaccharide  (PPSV23) vaccine. One dose is recommended after age 60. Talk to your health care provider about which screenings and vaccines you need and how often you need them. This information is not intended to replace advice given to you by your health care provider. Make sure you discuss any questions you have with your health care provider. Document Released: 11/03/2015 Document Revised: 06/26/2016 Document Reviewed: 08/08/2015 Elsevier Interactive Patient Education  2017 [employer] Prevention in the Home Falls can cause injuries. They can happen to people of all ages. There are many things you can do to make your home safe and to help prevent falls. What can I do on the outside of my home?  Regularly fix the edges of walkways and driveways and fix any cracks.  Remove anything that might make you trip as you walk through a door, such as a raised step or threshold.  Trim any bushes or trees on the path to your home.  Use bright outdoor lighting.  Clear any walking paths of anything that might make someone trip, such as rocks or tools.  Regularly check to see if handrails are loose or broken. Make sure that both sides of any steps have handrails.  Any raised decks and porches should have guardrails on the edges.  Have any leaves, snow, or ice cleared regularly.  Use sand or salt on walking paths during winter.  Clean up any spills in your garage right away. This includes oil or grease spills. What can I do in the bathroom?  Use night lights.  Install grab bars by the toilet and in the tub and shower. Do not use towel bars as grab bars.  Use non-skid mats or decals in the tub or shower.  If you need to sit down in the shower, use a plastic, non-slip stool.  Keep the floor dry. Clean up any water that spills on the floor as soon as it happens.  Remove soap buildup in the tub or shower regularly.  Attach bath mats securely with double-sided non-slip rug tape.  Do not have  throw rugs and other things on the floor that can make you trip. What can I do in the bedroom?  Use night lights.  Make sure that you have a light by your bed that is easy to reach.  Do not use any sheets or blankets that are too big for your bed. They should not hang down onto the floor.  Have a firm chair that has side arms. You can use this for support while you get dressed.  Do not have throw rugs and other things on the floor that can make you trip. What can I do in the kitchen?  Clean up any spills right away.  Avoid walking on wet floors.  Keep items that you use a lot in easy-to-reach places.  If you need to reach something above you, use a strong step stool that has a grab bar.  Keep electrical cords out of the way.  Do not use floor polish or wax that makes floors slippery. If you must use wax, use non-skid floor wax.  Do not have throw rugs and other things on the floor that can make you trip. What can I do with my stairs?  Do not leave  any items on the stairs.  Make sure that there are handrails on both sides of the stairs and use them. Fix handrails that are broken or loose. Make sure that handrails are as long as the stairways.  Check any carpeting to make sure that it is firmly attached to the stairs. Fix any carpet that is loose or worn.  Avoid having throw rugs at the top or bottom of the stairs. If you do have throw rugs, attach them to the floor with carpet tape.  Make sure that you have a light switch at the top of the stairs and the bottom of the stairs. If you do not have them, ask someone to add them for you. What else can I do to help prevent falls?  Wear shoes that:  Do not have high heels.  Have rubber bottoms.  Are comfortable and fit you well.  Are closed at the toe. Do not wear sandals.  If you use a stepladder:  Make sure that it is fully opened. Do not climb a closed stepladder.  Make sure that both sides of the stepladder are  locked into place.  Ask someone to hold it for you, if possible.  Clearly mark and make sure that you can see:  Any grab bars or handrails.  First and last steps.  Where the edge of each step is.  Use tools that help you move around (mobility aids) if they are needed. These include:  Canes.  Walkers.  Scooters.  Crutches.  Turn on the lights when you go into a dark area. Replace any light bulbs as soon as they burn out.  Set up your furniture so you have a clear path. Avoid moving your furniture around.  If any of your floors are uneven, fix them.  If there are any pets around you, be aware of where they are.  Review your medicines with your doctor. Some medicines can make you feel dizzy. This can increase your chance of falling. Ask your doctor what other things that you can do to help prevent falls. This information is not intended to replace advice given to you by your health care provider. Make sure you discuss any questions you have with your health care provider. Document Released: 08/03/2009 Document Revised: 03/14/2016 Document Reviewed: 11/11/2014 Elsevier Interactive Patient Education  2017 [employer].

## 2018-09-24 ENCOUNTER — Ambulatory Visit: Payer: Medicare HMO | Admitting: Nurse Practitioner

## 2018-09-24 LAB — CALCIUM, IONIZED: Calcium, Ion: 5.92 mg/dL — ABNORMAL HIGH (ref 4.8–5.6)

## 2018-09-24 LAB — VITAMIN D 25 HYDROXY (VIT D DEFICIENCY, FRACTURES): VIT D 25 HYDROXY: 38 ng/mL (ref 30–100)

## 2018-09-25 ENCOUNTER — Other Ambulatory Visit: Payer: Medicare HMO

## 2018-09-28 ENCOUNTER — Telehealth: Payer: Self-pay

## 2018-09-28 LAB — PTH, INTACT AND CALCIUM
Calcium: 10.8 mg/dL — ABNORMAL HIGH (ref 8.6–10.4)
PTH: 48 pg/mL (ref 14–64)

## 2018-09-28 NOTE — Telephone Encounter (Signed)
Patient stated that she had these labs drawn on 09/23/18 and she was not coming back in. She then hung up.

## 2018-09-28 NOTE — Telephone Encounter (Signed)
-----   Message from Sharon Seller, NP sent at 09/28/2018  9:42 AM EST ----- Can we see when this pt plans to get this done? Needing this to evaluate blood work  ----- Message ----- From: SYSTEM Sent: 09/28/2018  12:07 AM EST To: Sharon Seller, NP

## 2018-09-30 ENCOUNTER — Telehealth: Payer: Self-pay

## 2018-09-30 NOTE — Telephone Encounter (Signed)
-----   Message from Sharon Seller, NP sent at 09/29/2018 12:41 PM EST ----- PTH normal range, calcium continues to be mildly elevated, Lets have her stop her vit d and make sure she is not taking any supplements at home that have calcium in them. She will then need to follow up PTH, intact and calcium in 6 weeks

## 2018-09-30 NOTE — Telephone Encounter (Signed)
Discussed results with patient, patient verbalized understanding of results. Scheduled appointment for January 2020 to recheck labs. Future orders placed.   Patient is active on mychart, will retrieve copy of labs via mychart

## 2018-10-06 ENCOUNTER — Other Ambulatory Visit: Payer: Self-pay | Admitting: *Deleted

## 2018-10-06 MED ORDER — ALLOPURINOL 300 MG PO TABS: 300.0000 mg | ORAL_TABLET | Freq: Every day | ORAL | 1 refills | Status: DC

## 2018-10-06 NOTE — Telephone Encounter (Signed)
[facility]

## 2018-10-27 ENCOUNTER — Other Ambulatory Visit: Payer: Self-pay | Admitting: *Deleted

## 2018-10-27 MED ORDER — PRAVASTATIN SODIUM 40 MG PO TABS: 40.0000 mg | ORAL_TABLET | Freq: Every day | ORAL | 1 refills | Status: DC

## 2018-10-27 NOTE — Telephone Encounter (Signed)
[facility]

## 2018-11-03 ENCOUNTER — Other Ambulatory Visit: Payer: Self-pay | Admitting: *Deleted

## 2018-11-03 MED ORDER — HYDROCHLOROTHIAZIDE 25 MG PO TABS: 25.0000 mg | ORAL_TABLET | Freq: Every day | ORAL | 1 refills | Status: DC

## 2018-11-03 MED ORDER — METFORMIN HCL 500 MG PO TABS: 500.0000 mg | ORAL_TABLET | Freq: Every day | ORAL | 1 refills | Status: DC

## 2018-11-03 NOTE — Telephone Encounter (Signed)
[facility]

## 2018-11-10 ENCOUNTER — Other Ambulatory Visit: Payer: Medicare HMO

## 2018-11-11 LAB — PTH, INTACT AND CALCIUM
Calcium: 10.6 mg/dL — ABNORMAL HIGH (ref 8.6–10.4)
PTH: 66 pg/mL — ABNORMAL HIGH (ref 14–64)

## 2018-11-13 ENCOUNTER — Other Ambulatory Visit: Payer: Self-pay | Admitting: Nurse Practitioner

## 2018-11-25 ENCOUNTER — Ambulatory Visit
Admission: RE | Admit: 2018-11-25 | Discharge: 2018-11-25 | Disposition: A | Discharge status: Home / Self Care | Payer: Medicare HMO | Source: Ambulatory Visit | Attending: Nurse Practitioner | Admitting: Nurse Practitioner

## 2018-11-25 DIAGNOSIS — Z780 Asymptomatic menopausal state: Secondary | ICD-10-CM | POA: Diagnosis not present

## 2018-11-25 DIAGNOSIS — Z13820 Encounter for screening for osteoporosis: Secondary | ICD-10-CM | POA: Diagnosis not present

## 2018-11-25 DIAGNOSIS — E2839 Other primary ovarian failure: Secondary | ICD-10-CM

## 2018-12-07 ENCOUNTER — Other Ambulatory Visit: Payer: Self-pay | Admitting: Nurse Practitioner

## 2018-12-07 DIAGNOSIS — I10 Essential (primary) hypertension: Secondary | ICD-10-CM

## 2018-12-23 ENCOUNTER — Encounter: Payer: Self-pay | Admitting: Nurse Practitioner

## 2018-12-23 ENCOUNTER — Ambulatory Visit (INDEPENDENT_AMBULATORY_CARE_PROVIDER_SITE_OTHER): Payer: Medicare HMO | Admitting: Nurse Practitioner

## 2018-12-23 VITALS — BP 122/76 | HR 67 | Temp 98.1°F | Ht 62.0 in | Wt 168.0 lb

## 2018-12-23 DIAGNOSIS — E1169 Type 2 diabetes mellitus with other specified complication: Secondary | ICD-10-CM | POA: Diagnosis not present

## 2018-12-23 DIAGNOSIS — M1A9XX0 Chronic gout, unspecified, without tophus (tophi): Secondary | ICD-10-CM | POA: Diagnosis not present

## 2018-12-23 DIAGNOSIS — R002 Palpitations: Secondary | ICD-10-CM

## 2018-12-23 DIAGNOSIS — E782 Mixed hyperlipidemia: Secondary | ICD-10-CM | POA: Diagnosis not present

## 2018-12-23 DIAGNOSIS — M8949 Other hypertrophic osteoarthropathy, multiple sites: Secondary | ICD-10-CM

## 2018-12-23 DIAGNOSIS — I10 Essential (primary) hypertension: Secondary | ICD-10-CM

## 2018-12-23 DIAGNOSIS — M150 Primary generalized (osteo)arthritis: Secondary | ICD-10-CM | POA: Diagnosis not present

## 2018-12-23 DIAGNOSIS — M069 Rheumatoid arthritis, unspecified: Secondary | ICD-10-CM | POA: Diagnosis not present

## 2018-12-23 DIAGNOSIS — M159 Polyosteoarthritis, unspecified: Secondary | ICD-10-CM

## 2018-12-23 MED ORDER — GLUCOSE BLOOD VI STRP: ORAL_STRIP | 11 refills | Status: DC

## 2018-12-23 MED ORDER — ACCU-CHEK FASTCLIX LANCETS MISC: 11 refills | Status: DC

## 2018-12-23 MED ORDER — LOSARTAN POTASSIUM 100 MG PO TABS: 100.0000 mg | ORAL_TABLET | Freq: Every day | ORAL | 1 refills | Status: DC

## 2018-12-23 NOTE — Progress Notes (Signed)
Careteam: Patient Care Team: Sharon Seller, NP as PCP - General (Geriatric Medicine)  Advanced Directive information    Allergies  Allergen Reactions  . Bactrim [Sulfamethoxazole-Trimethoprim] Diarrhea  . Clindamycin/Lincomycin Rash and Diarrhea    Terrible stomach cramps per patient  . Oxycodone Nausea And Vomiting and Nausea Only  . Penicillins Rash, Hives and Swelling    Chief Complaint  Patient presents with  . Medical Management of Chronic Issues    3 month follow-up, fasting for labs   . Medication Refill    Renew all DM testing supplies and losartan to [facility]   . Best Practice Recommendations    Refused mammogram (will consider in the fall) and TDAP     HPI: Patient is a 78 y.o. female seen in the office today for routine follow up.  Reports at night her heart is racing and wakes her up. In the last 2 months it has done this weekly.  No shortness of breath or chest pains. Improved if she sits up.  Deeps breaths help.  States she has taken her BP and HR is as high as 120. Not a fib (husband has this) does not feel irregular. On ASA daily   Type 2 DM -she is on metformin 500 mg tablet daily. No hypoglycemia. cbg 105 this morning.   Hypertension- stable on cozaar, hctz and norvasc  Hyperlipidemia - continues to take pravastatin 40 mg tablet daily and watches her diet however cooking could improve. Does not wish to make changes to medication, aware goal LDL <70 for cardiovascular risk.   Osteoarthritis - right knee pain- request injections-does not wish to go anywhere to get this done.  used to get those in the past, last injection was in January 2019. Had imagining back in May. Hx of OA with meniscus tear, was repaired in 2010 but feels like it tore again. Did not have an injury but feels like due to poor body mechanics caused injury- climbing out of sand trap, moving furniture.  Uses tylenol 1000 mg occasionally due to pain. Maybe will use once  weekly. Pain currently 6/10, aching.   Gout- continues on allopurinol, uric acid level 3.9 02/2018  hypercalcemia elevated cal level in the past, has stopped cal and vit d supplements.   Review of Systems:  Review of Systems  Constitutional: Negative for chills, fever, malaise/fatigue and weight loss.  HENT: Negative for hearing loss.   Respiratory: Negative for cough, sputum production and shortness of breath.   Cardiovascular: Positive for palpitations. Negative for chest pain and leg swelling.  Gastrointestinal: Negative for abdominal pain, constipation, diarrhea and heartburn.  Genitourinary: Negative for dysuria, frequency and urgency.  Musculoskeletal: Positive for joint pain (right knee pain). Negative for back pain, falls and myalgias.  Skin: Negative.   Neurological: Negative for dizziness and headaches.  Psychiatric/Behavioral: Negative for depression and memory loss. The patient has insomnia.     Past Medical History:  Diagnosis Date  . Arthritis   . DM (diabetes mellitus) (HCC)   . Hypercalcemia   . Hypercholesteremia   . Hypertension   . Rheumatoid arteritis (HCC)    Past Surgical History:  Procedure Laterality Date  . bladder prolapse  2018   Dr. Cranston Neighbor  . carpal tunnel Bilateral 1998  . DILATION AND CURETTAGE OF UTERUS  1964   Dr. Alease Medina  . MENISCUS REPAIR Right 2010   Dr. Cranston Neighbor  . PARTIAL HYSTERECTOMY  1973   ovaries remain  .  Stroke  1994   TIA  . TONSILLECTOMY  1956   Social History:   reports that she has quit smoking. She has never used smokeless tobacco. She reports current alcohol use of about 7.0 standard drinks of alcohol per week. She reports that she does not use drugs.  Family History  Problem Relation Age of Onset  . Hypertension Son   . Hypertension Son   . Hypertension Son     Medications: Patient's Medications  New Prescriptions   No medications on file  Previous Medications   ACCU-CHEK FASTCLIX LANCETS  MISC    by Does not apply route. Check blood sugar once daily E11.9   ALLOPURINOL (ZYLOPRIM) 300 MG TABLET    Take 1 tablet (300 mg total) by mouth daily.   AMLODIPINE (NORVASC) 5 MG TABLET    TAKE 1 TABLET DAILY   ASPIRIN EC 325 MG TABLET    Take 325 mg by mouth daily.   BIOTIN 5000 PO    Take 1 capsule by mouth daily.   FOLIC ACID (FOLVITE) 400 MCG TABLET    Take 400 mcg by mouth daily.   GLUCOSE BLOOD (ACCU-CHEK AVIVA PLUS) TEST STRIP    Check blood sugar once daily E11.9   HYDROCHLOROTHIAZIDE (HYDRODIURIL) 25 MG TABLET    Take 1 tablet (25 mg total) by mouth daily.   LOSARTAN (COZAAR) 100 MG TABLET    Take 100 mg by mouth daily.   METFORMIN (GLUCOPHAGE) 500 MG TABLET    Take 1 tablet (500 mg total) by mouth daily.   NAPROXEN SODIUM (ALEVE) 220 MG TABLET    Take 220 mg by mouth daily as needed.   PRAVASTATIN (PRAVACHOL) 40 MG TABLET    Take 1 tablet (40 mg total) by mouth daily.   TIMOLOL (BETIMOL) 0.5 % OPHTHALMIC SOLUTION    Place 1 drop into both eyes daily.  Modified Medications   No medications on file  Discontinued Medications   CHOLECALCIFEROL (VITAMIN D) 1000 UNITS TABLET    Take 2,000 Units by mouth daily.     Physical Exam:  Vitals:   12/23/18 0838  BP: 122/76  Pulse: 67  Temp: 98.1 F (36.7 C)  TempSrc: Oral  SpO2: 98%  Weight: 168 lb (76.2 kg)  Height: 5\' 2"  (1.575 m)   Body mass index is 30.73 kg/m.  Physical Exam Constitutional:      Appearance: Normal appearance. She is well-developed.  HENT:     Mouth/Throat:     Pharynx: No oropharyngeal exudate.  Eyes:     General: No scleral icterus.    Pupils: Pupils are equal, round, and reactive to light.  Neck:     Musculoskeletal: Neck supple.     Thyroid: No thyromegaly.     Vascular: No carotid bruit.     Trachea: No tracheal deviation.  Cardiovascular:     Rate and Rhythm: Normal rate and regular rhythm.     Heart sounds: Murmur (1/6 SEM) present. No friction rub. No gallop.      Comments: No LE edema  b/l. no calf TTP.  Pulmonary:     Effort: Pulmonary effort is normal. No respiratory distress.     Breath sounds: Normal breath sounds. No stridor. No wheezing or rales.  Abdominal:     General: Bowel sounds are normal.     Palpations: Abdomen is soft. Abdomen is not rigid. There is no hepatomegaly.     Comments: obese  Lymphadenopathy:     Cervical: No cervical adenopathy.  Skin:    General: Skin is warm and dry.     Findings: No rash.  Neurological:     Mental Status: She is alert and oriented to person, place, and time.     Deep Tendon Reflexes: Reflexes are normal and symmetric.  Psychiatric:        Behavior: Behavior normal.        Thought Content: Thought content normal.        Judgment: Judgment normal.     Labs reviewed: Basic Metabolic Panel: Recent Labs    03/17/18 1227  06/08/18 0840 09/21/18 0934 09/25/18 1047 11/10/18 1051  NA 140  --  137 138  --   --   K 4.1  --  4.2 4.0  --   --   CL 102  --  102 100  --   --   CO2 29  --  26 29  --   --   GLUCOSE 87  --  120* 116*  --   --   BUN 20  --  21 20  --   --   CREATININE 0.67  --  0.64 0.65  --   --   CALCIUM 11.1*   < > 10.8* 11.1* 10.8* 10.6*  TSH  --   --   --  2.31  --   --    < > = values in this interval not displayed.   Liver Function Tests: Recent Labs    03/17/18 1227 06/08/18 0840 09/21/18 0934  AST 18 19 18   ALT 15 16 16   BILITOT 0.8 1.0 0.8  PROT 6.8 6.3 6.4   No results for input(s): LIPASE, AMYLASE in the last 8760 hours. No results for input(s): AMMONIA in the last 8760 hours. CBC: Recent Labs    03/17/18 1227 06/08/18 0840 09/21/18 0934  WBC 9.4 6.1 8.4  NEUTROABS 6,232 3,209 4,931  HGB 14.4 13.8 13.9  HCT 41.8 41.1 41.0  MCV 96.3 96.3 96.0  PLT 294 281 275   Lipid Panel: Recent Labs    06/08/18 0840 09/21/18 0934  CHOL 159 195  HDL 57 60  LDLCALC 80 105*  TRIG 123 181*  CHOLHDL 2.8 3.3   TSH: Recent Labs    09/21/18 0934  TSH 2.31   A1C: Lab Results    Component Value Date   HGBA1C 5.8 (H) 09/21/2018     Assessment/Plan 1. Palpitation Weekly at night; denies chest pains, shortness of breath but does disturb sleep.  - COMPLETE METABOLIC PANEL WITH GFR - PTH, Intact and Calcium - Calcium, ionized - CBC with Differential/Platelet - TSH - EKG 12-Lead- showing right bbb which was not new based on review in epic; SR, due to frequent palpitations will refer to cardiology for further evaluation.  - Ambulatory referral to Cardiology  2. Hypercalcemia -mild elevation in pth and calcium. Asymptomatic, will follow today - PTH, Intact and Calcium - Calcium, ionized  3. Mixed hyperlipidemia -continues on Pravachol with dietary modifications.  - Lipid Panel  4. Type 2 diabetes mellitus with other specified complication, without long-term current use of insulin (HCC) -A1c at goal in December. Continues with good reading on glucometer. Will continue current metformin 500 mg daily  - glucose blood (ACCU-CHEK AVIVA PLUS) test strip; Check blood sugar once daily E11.9  Dispense: 100 each; Refill: 11 - ACCU-CHEK FASTCLIX LANCETS MISC; Check blood sugar once daily E11.9  Dispense: 102 each; Refill: 11  5. Essential hypertension -stable on current regimen with diet  modifications.  - losartan (COZAAR) 100 MG tablet; Take 1 tablet (100 mg total) by mouth daily.  Dispense: 90 tablet; Refill: 1 - EKG 12-Lead  6. Primary osteoarthritis involving multiple joints Stable, discussed knee injections but does not wish to go anywhere for this to be done or make appt with Dr Renato Gails in office which was offered. Continues tylenol with good results.   7. Chronic gout without tophus, unspecified cause, unspecified site Stable, continues on allopurinol.   8. Rheumatoid arthritis, involving unspecified site, unspecified rheumatoid factor presence (HCC) Hx of RA, previously documented that she has been on methotrexate and folic acid, methotrexate currently not on  medication list, will need this clarified.   Next appt: 4 months for routine follow up Katherleen Folkes K. Biagio Borg  [facility] [phone]

## 2018-12-23 NOTE — Patient Instructions (Signed)
Referral placed to cardiology for further evaluation of palpitations

## 2018-12-24 LAB — COMPLETE METABOLIC PANEL WITH GFR
AG Ratio: 2.4 (calc) (ref 1.0–2.5)
ALT: 14 U/L (ref 6–29)
AST: 16 U/L (ref 10–35)
Albumin: 4.5 g/dL (ref 3.6–5.1)
Alkaline phosphatase (APISO): 73 U/L (ref 37–153)
BILIRUBIN TOTAL: 0.6 mg/dL (ref 0.2–1.2)
BUN: 18 mg/dL (ref 7–25)
CHLORIDE: 104 mmol/L (ref 98–110)
CO2: 31 mmol/L (ref 20–32)
Calcium: 10.5 mg/dL — ABNORMAL HIGH (ref 8.6–10.4)
Creat: 0.65 mg/dL (ref 0.60–0.93)
GFR, Est African American: 99 mL/min/{1.73_m2} (ref >=60)
GFR, Est Non African American: 86 mL/min/{1.73_m2} (ref >=60)
Globulin: 1.9 g/dL (calc) (ref 1.9–3.7)
Glucose, Bld: 108 mg/dL — ABNORMAL HIGH (ref 65–99)
POTASSIUM: 3.9 mmol/L (ref 3.5–5.3)
Sodium: 142 mmol/L (ref 135–146)
Total Protein: 6.4 g/dL (ref 6.1–8.1)

## 2018-12-24 LAB — CBC WITH DIFFERENTIAL/PLATELET
Absolute Monocytes: 485 cells/uL (ref 200–950)
Basophils Absolute: 50 cells/uL (ref 0–200)
Basophils Relative: 0.8 %
EOS ABS: 170 {cells}/uL (ref 15–500)
Eosinophils Relative: 2.7 %
HCT: 39.3 % (ref 35.0–45.0)
Hemoglobin: 13.6 g/dL (ref 11.7–15.5)
Lymphs Abs: 2167 cells/uL (ref 850–3900)
MCH: 32.7 pg (ref 27.0–33.0)
MCHC: 34.6 g/dL (ref 32.0–36.0)
MCV: 94.5 fL (ref 80.0–100.0)
MONOS PCT: 7.7 %
MPV: 9 fL (ref 7.5–12.5)
Neutro Abs: 3427 cells/uL (ref 1500–7800)
Neutrophils Relative %: 54.4 %
Platelets: 304 10*3/uL (ref 140–400)
RBC: 4.16 10*6/uL (ref 3.80–5.10)
RDW: 12.5 % (ref 11.0–15.0)
Total Lymphocyte: 34.4 %
WBC: 6.3 10*3/uL (ref 3.8–10.8)

## 2018-12-24 LAB — LIPID PANEL
Cholesterol: 160 mg/dL (ref <200)
HDL: 64 mg/dL (ref >=50)
LDL Cholesterol (Calc): 78 mg/dL (calc)
Non-HDL Cholesterol (Calc): 96 mg/dL (calc) (ref <130)
TRIGLYCERIDES: 99 mg/dL (ref <150)
Total CHOL/HDL Ratio: 2.5 (calc) (ref <5.0)

## 2018-12-24 LAB — TSH: TSH: 1.72 mIU/L (ref 0.40–4.50)

## 2018-12-24 LAB — PTH, INTACT AND CALCIUM
Calcium: 10.5 mg/dL — ABNORMAL HIGH (ref 8.6–10.4)
PTH: 76 pg/mL — ABNORMAL HIGH (ref 14–64)

## 2018-12-24 LAB — CALCIUM, IONIZED: Calcium, Ion: 5.93 mg/dL — ABNORMAL HIGH (ref 4.8–5.6)

## 2018-12-28 ENCOUNTER — Telehealth: Payer: Self-pay | Admitting: *Deleted

## 2018-12-28 NOTE — Telephone Encounter (Signed)
Patient called and stated that she had bloodwork done on 12/23/18 and had to give a credit card to pay for them up front. Stated that she has not heard the results yet and wants the results. Please Advise.

## 2018-12-28 NOTE — Telephone Encounter (Signed)
Discussed with patient, see lab results dated 12/23/2018

## 2018-12-28 NOTE — Telephone Encounter (Signed)
Please see result notes.  

## 2019-01-08 DIAGNOSIS — H401331 Pigmentary glaucoma, bilateral, mild stage: Secondary | ICD-10-CM | POA: Diagnosis not present

## 2019-01-08 DIAGNOSIS — H2513 Age-related nuclear cataract, bilateral: Secondary | ICD-10-CM | POA: Diagnosis not present

## 2019-02-04 ENCOUNTER — Ambulatory Visit: Payer: Medicare HMO | Admitting: Internal Medicine

## 2019-02-10 ENCOUNTER — Telehealth: Payer: Self-pay

## 2019-02-10 NOTE — Telephone Encounter (Signed)
-----   Message from Parke Poisson, MD sent at 02/10/2019  8:22 AM EDT ----- Please schedule VIDEO visit with me 4/24, 5/12, 5/14 or 5/15.  Thanks, [place] ----- Message ----- From: Alyson Ingles, LPN Sent: 11/29/1978   3:18 PM EDT To: Alyson Ingles, LPN, Parke Poisson, MD  Hello Dr Jacques Navy Here is another new pt referral from proficient referred for h/o palpitations, hyperlipidemia and htn is this a good one for virtual visit? She has an EKG from march in the system Thank you!

## 2019-02-10 NOTE — Telephone Encounter (Signed)
Called pt and she would like to have a telephone visit. She has BP cuff, scale and mychart. Obtained verbal consent below from pt.  Due to the recent COVID-19 pandemic, we are transitioning in-person office visits to tele-medicine visits in an effort to decrease unnecessary exposure to our patients, their families, and staff. These visits are billed to your insurance just like a normal visit is. We also encourage you to sign up for MyChart if you have not already done so. You will need a smartphone if possible. For patients that do not have this, we can still complete the visit using a regular telephone but do prefer a smartphone to enable video when possible. You may have a family member that lives with you that can help. If possible, we also ask that you have a blood pressure cuff and scale at home to measure your blood pressure, heart rate and weight prior to your scheduled appointment. Patients with clinical needs that need an in-person evaluation and testing will still be able to come to the office if absolutely necessary. If you have any questions, feel free to call our office.  YOUR PROVIDER WILL BE USING THE FOLLOWING PLATFORM TO COMPLETE YOUR VISIT: telephone visit. Pt refuses Mychart at this time.  THE DAY OF YOUR APPOINTMENT Approximately 15 minutes prior to your scheduled appointment, you will receive a telephone call from one of [facility] team - your caller ID may say "Unknown caller."  Our staff will confirm medications, vital signs for the day and any symptoms you may be experiencing. Please have this information available prior to the time of visit start. It may also be helpful for you to have a pad of paper and pen handy for any instructions given during your visit. They will also walk you through joining the smartphone meeting if this is a video visit.  CONSENT FOR TELE-HEALTH VISIT - PLEASE REVIEW I hereby voluntarily request, consent and authorize [facility] and its employed or  contracted physicians, physician assistants, nurse practitioners or other licensed health care professionals (the Practitioner), to provide me with telemedicine health care services (the "Services") as deemed necessary by the treating Practitioner. I acknowledge and consent to receive the Services by the Practitioner via telemedicine. I understand that the telemedicine visit will involve communicating with the Practitioner through live audiovisual communication technology and the disclosure of certain medical information by electronic transmission. I acknowledge that I have been given the opportunity to request an in-person assessment or other available alternative prior to the telemedicine visit and am voluntarily participating in the telemedicine visit.  I understand that I have the right to withhold or withdraw my consent to the use of telemedicine in the course of my care at any time, without affecting my right to future care or treatment, and that the Practitioner or I may terminate the telemedicine visit at any time. I understand that I have the right to inspect all information obtained and/or recorded in the course of the telemedicine visit and may receive copies of available information for a reasonable fee.  I understand that some of the potential risks of receiving the Services via telemedicine include:  Marland Kitchen Delay or interruption in medical evaluation due to technological equipment failure or disruption; . Information transmitted may not be sufficient (e.g. poor resolution of images) to allow for appropriate medical decision making by the Practitioner; and/or  . In rare instances, security protocols could fail, causing a breach of personal health information.  Furthermore, I acknowledge that it  is my responsibility to provide information about my medical history, conditions and care that is complete and accurate to the best of my ability. I acknowledge that Practitioner's advice, recommendations,  and/or decision may be based on factors not within their control, such as incomplete or inaccurate data provided by me or distortions of diagnostic images or specimens that may result from electronic transmissions. I understand that the practice of medicine is not an exact science and that Practitioner makes no warranties or guarantees regarding treatment outcomes. I acknowledge that I will receive a copy of this consent concurrently upon execution via email to the email address I last provided but may also request a printed copy by calling the office of [facility].    I understand that my insurance will be billed for this visit.   I have read or had this consent read to me. . I understand the contents of this consent, which adequately explains the benefits and risks of the Services being provided via telemedicine.  . I have been provided ample opportunity to ask questions regarding this consent and the Services and have had my questions answered to my satisfaction. . I give my informed consent for the services to be provided through the use of telemedicine in my medical care  By participating in this telemedicine visit I agree to the above.

## 2019-02-11 ENCOUNTER — Telehealth: Payer: Self-pay | Admitting: Cardiovascular Disease

## 2019-02-11 ENCOUNTER — Telehealth: Payer: Self-pay | Admitting: Internal Medicine

## 2019-02-11 NOTE — Telephone Encounter (Signed)
Home phone/ virtual consent/ my chart/ pre reg completed °

## 2019-02-12 ENCOUNTER — Encounter: Payer: Self-pay | Admitting: Internal Medicine

## 2019-02-12 ENCOUNTER — Telehealth (INDEPENDENT_AMBULATORY_CARE_PROVIDER_SITE_OTHER): Payer: Medicare HMO | Admitting: Internal Medicine

## 2019-02-12 VITALS — BP 124/66 | HR 63 | Ht 62.0 in | Wt 167.0 lb

## 2019-02-12 DIAGNOSIS — R002 Palpitations: Secondary | ICD-10-CM

## 2019-02-12 DIAGNOSIS — I10 Essential (primary) hypertension: Secondary | ICD-10-CM | POA: Diagnosis not present

## 2019-02-12 DIAGNOSIS — E119 Type 2 diabetes mellitus without complications: Secondary | ICD-10-CM

## 2019-02-12 DIAGNOSIS — E782 Mixed hyperlipidemia: Secondary | ICD-10-CM | POA: Diagnosis not present

## 2019-02-12 NOTE — Progress Notes (Signed)
Virtual Visit via Telephone Note   This visit type was conducted due to national recommendations for restrictions regarding the COVID-19 Pandemic (e.g. social distancing) in an effort to limit this patient's exposure and mitigate transmission in our community.  Due to her co-morbid illnesses, this patient is at least at moderate risk for complications without adequate follow up.  This format is felt to be most appropriate for this patient at this time.  The patient did not have access to video technology/had technical difficulties with video requiring transitioning to audio format only (telephone).  All issues noted in this document were discussed and addressed.  No physical exam could be performed with this format.  Please refer to the patient's chart for her  consent to telehealth for [facility].   Evaluation Performed:  Follow-up visit  Date:  02/12/2019   ID:  Debra Madden, DOB 03-03-1941, MRN [id number]  Patient Location: Home Provider Location: Home  PCP:  Debra Seller, NP  Cardiologist:  No primary care provider on file.  Electrophysiologist:  None   Chief Complaint:  Palpitations  History of Present Illness:    Debra Madden is a 78 y.o. female with DM2 >15 years, HTN, HLD, TIA, osteoarthritis, gout, and hypercalcemia who presents today for evaluation of palpitations. She describes palpitations at night, over the last several months, starting in February. 2-3 a month, >2 hours. Sitting up and deep breathing helps relieve symptoms. No dizziness, lightheadedness.  Caffeine: 1 cup of coffee in the morning Alcohol: 1 glass of wine nightly Water intake: good Snoring: no TSH: normal Herbal supplements/diet products: no Syncope/presyncope: 1 fainting episode likely vasovagal.  Normal TSH. ECG 12/23/2018 - SR, RBBB. Patient tells me RBBB is not new.   HTN during pregnancy.  FHX - FATHER 55 MI SCD, MOTHER 70S MI  The patient does not have symptoms concerning for  COVID-19 infection (fever, chills, cough, or new shortness of breath).    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 Cranston Madden   carpal tunnel Bilateral 1998   DILATION AND CURETTAGE OF UTERUS  1964   Dr. Alease Madden   MENISCUS REPAIR Right 2010   Dr. Cranston Madden   PARTIAL HYSTERECTOMY  1973   ovaries remain   Stroke  1994   TIA   TONSILLECTOMY  1956     Current Meds  Medication Sig   ACCU-CHEK FASTCLIX LANCETS MISC Check blood sugar once daily E11.9   allopurinol (ZYLOPRIM) 300 MG tablet Take 1 tablet (300 mg total) by mouth daily.   amLODipine (NORVASC) 5 MG tablet TAKE 1 TABLET DAILY   aspirin EC 325 MG tablet Take 325 mg by mouth daily.   BIOTIN 5000 PO Take 1 capsule by mouth daily.   folic acid (FOLVITE) 400 MCG tablet Take 800 mcg by mouth daily.    glucose blood (ACCU-CHEK AVIVA PLUS) test strip Check blood sugar once daily E11.9   hydrochlorothiazide (HYDRODIURIL) 25 MG tablet Take 1 tablet (25 mg total) by mouth daily.   losartan (COZAAR) 100 MG tablet Take 1 tablet (100 mg total) by mouth daily.   metFORMIN (GLUCOPHAGE) 500 MG tablet Take 1 tablet (500 mg total) by mouth daily.   pravastatin (PRAVACHOL) 40 MG tablet  Take 1 tablet (40 mg total) by mouth daily.   timolol (BETIMOL) 0.5 % ophthalmic solution Place 1 drop into both eyes daily.   [DISCONTINUED] amLODipine (NORVASC) 5 MG tablet TAKE 1 TABLET DAILY     Allergies:   Bactrim [sulfamethoxazole-trimethoprim]; Clindamycin/lincomycin; Oxycodone; and Penicillins   Social History   Tobacco Use   Smoking status: Former Smoker   Smokeless tobacco: Never Used   Tobacco comment: Quit at age 78  Substance Use Topics   Alcohol use: Yes    Alcohol/week: 7.0  standard drinks    Types: 7 Glasses of wine per week   Drug use: Never     Family Hx: The patient's family history includes Hypertension in her son, son, and son.  ROS:   Please see the history of present illness.     All other systems reviewed and are negative.   Prior CV studies:   The following studies were reviewed today:  No new  Labs/Other Tests and Data Reviewed:    EKG:  An ECG dated 12/23/2018 was personally reviewed today and demonstrated:  SR, RBBB  Recent Labs: 12/23/2018: ALT 14; BUN 18; Creat 0.65; Hemoglobin 13.6; Platelets 304; Potassium 3.9; Sodium 142; TSH 1.72   Recent Lipid Panel Lab Results  Component Value Date/Time   CHOL 160 12/23/2018 09:24 AM   TRIG 99 12/23/2018 09:24 AM   HDL 64 12/23/2018 09:24 AM   CHOLHDL 2.5 12/23/2018 09:24 AM   LDLCALC 78 12/23/2018 09:24 AM    Wt Readings from Last 3 Encounters:  02/12/19 167 lb (75.8 kg)  12/23/18 168 lb (76.2 kg)  09/23/18 168 lb (76.2 kg)     Objective:    Vital Signs:  BP 124/66    Pulse 63    Ht 5\' 2"  (1.575 m)    Wt 167 lb (75.8 kg)    BMI 30.54 kg/m    VITAL SIGNS:  reviewed GEN:  no acute distress EYES:  sclerae anicteric, EOMI - Extraocular Movements Intact RESPIRATORY:  normal respiratory effort, symmetric expansion CARDIOVASCULAR:  no peripheral edema SKIN:  no rash, lesions or ulcers. MUSCULOSKELETAL:  no obvious deformities. NEURO:  alert and oriented x 3, no obvious focal deficit PSYCH:  normal affect  ASSESSMENT & PLAN:    1. Palpitations   2. Mixed hyperlipidemia   3. Essential hypertension with goal blood pressure less than 140/90   4. Type 2 diabetes mellitus without complication, without long-term current use of insulin (HCC)    We will monitor her symptoms and will plan on follow up in 3 months. She will focus on lifestyle modification and will contact us if she would like to wear an extended monitor. With how infrequent symptoms are occurring, a 30 day monitor may be  required if investigation is performed.   HTN - controlled on current medications  HLD - controlled.    COVID-19 Education: The signs and symptoms of COVID-19 were discussed with the patient and how to seek care for testing (follow up with PCP or arrange E-visit).  The importance of social distancing was discussed today.  Time:   Today, I have spent 30 minutes with the patient with telehealth technology discussing the above problems.     Medication Adjustments/Labs and Tests Ordered: Current medicines are reviewed at length with the patient today.  Concerns regarding medicines are outlined above.   Tests Ordered: No orders of the defined types were placed in this encounter.   Medication Changes: No orders of the defined types  were placed in this encounter.   Disposition:  Follow up in 3 month(s)  Signed, Parke Poisson, MD  02/12/2019 11:57 PM    [facility]  Medication Instructions:  Your physician recommends that you continue on your current medications as directed. Please refer to the Current Medication list given to you today.  If you need a refill on your cardiac medications before your next appointment, please call your pharmacy.    Follow-Up: At [facility], you and your health needs are our priority.  As part of our continuing mission to provide you with exceptional heart care, we have created designated Provider Care Teams.  These Care Teams include your primary Cardiologist (physician) and Advanced Practice Providers (APPs -  Physician Assistants and Nurse Practitioners) who all work together to provide you with the care you need, when you need it.  You have been scheduled for a follow-up virtual visit with Dr. Jacques Navy on April 28, 2019 at 2:00 PM.  Any Other Special Instructions Will Be Listed Below (If Applicable). None

## 2019-02-12 NOTE — Patient Instructions (Signed)
Medication Instructions:  Your physician recommends that you continue on your current medications as directed. Please refer to the Current Medication list given to you today.  If you need a refill on your cardiac medications before your next appointment, please call your pharmacy.    Follow-Up: At [facility], you and your health needs are our priority.  As part of our continuing mission to provide you with exceptional heart care, we have created designated Provider Care Teams.  These Care Teams include your primary Cardiologist (physician) and Advanced Practice Providers (APPs -  Physician Assistants and Nurse Practitioners) who all work together to provide you with the care you need, when you need it. . You have been scheduled for a follow-up virtual visit with Dr. Jacques Navy on April 28, 2019 at 2:00 PM.  Any Other Special Instructions Will Be Listed Below (If Applicable). None

## 2019-03-08 ENCOUNTER — Other Ambulatory Visit: Payer: Self-pay | Admitting: Nurse Practitioner

## 2019-03-08 DIAGNOSIS — I10 Essential (primary) hypertension: Secondary | ICD-10-CM

## 2019-03-16 ENCOUNTER — Telehealth: Payer: Self-pay | Admitting: *Deleted

## 2019-03-16 NOTE — Telephone Encounter (Signed)
Patient called and stated that she has a UTI. I asked patient for symptoms and she stated that she knows what she has and she wants an antibiotic called in. I Informed patient that we would need to collect a Urine specimen and she stated, loudly, that she was NOT going to do that and she was NOT going to come in. Stated that we could just call her something in.  I tried to explain to her that we would need a specimen in order to give her the right antibiotic. She stated she was not going to leave a specimen and hung up on me.

## 2019-03-16 NOTE — Telephone Encounter (Signed)
Patient refused

## 2019-03-16 NOTE — Telephone Encounter (Signed)
Yes would like for her to be seen (can offer televisit but would need her to come to office so we can get a urine sample). Thank you.

## 2019-03-29 ENCOUNTER — Other Ambulatory Visit: Payer: Self-pay | Admitting: Internal Medicine

## 2019-04-16 DIAGNOSIS — H01111 Allergic dermatitis of right upper eyelid: Secondary | ICD-10-CM | POA: Diagnosis not present

## 2019-04-22 NOTE — Telephone Encounter (Signed)
Open n error °

## 2019-04-27 ENCOUNTER — Telehealth: Payer: Self-pay | Admitting: *Deleted

## 2019-04-27 ENCOUNTER — Other Ambulatory Visit: Payer: Self-pay | Admitting: Internal Medicine

## 2019-04-27 NOTE — Telephone Encounter (Signed)
UN ABLE TO LEAVE  MESSAGE - PHONE RINGS THE HANGS UP -  WOULD LIKE TO  SWITCH APPT TO 1:20 IF PATIENT WOULD LIKE TO KEEP A VIRTUAL APPT ,IF PATIENT WOULD LIKE IN OFFICE VISIT CAN STAY AT 2 PM TIME SLOT

## 2019-04-27 NOTE — Telephone Encounter (Signed)
Debra Madden patient, she has appt tomorrow, ok to fill?

## 2019-04-28 ENCOUNTER — Telehealth (INDEPENDENT_AMBULATORY_CARE_PROVIDER_SITE_OTHER): Payer: Medicare HMO | Admitting: Internal Medicine

## 2019-04-28 ENCOUNTER — Other Ambulatory Visit: Payer: Self-pay

## 2019-04-28 ENCOUNTER — Encounter: Payer: Self-pay | Admitting: Internal Medicine

## 2019-04-28 ENCOUNTER — Ambulatory Visit (INDEPENDENT_AMBULATORY_CARE_PROVIDER_SITE_OTHER): Payer: Medicare HMO | Admitting: Nurse Practitioner

## 2019-04-28 ENCOUNTER — Telehealth: Payer: Medicare HMO | Admitting: Internal Medicine

## 2019-04-28 ENCOUNTER — Encounter: Payer: Self-pay | Admitting: Nurse Practitioner

## 2019-04-28 VITALS — BP 133/78 | HR 66 | Ht 62.0 in | Wt 168.0 lb

## 2019-04-28 DIAGNOSIS — I10 Essential (primary) hypertension: Secondary | ICD-10-CM | POA: Diagnosis not present

## 2019-04-28 DIAGNOSIS — M159 Polyosteoarthritis, unspecified: Secondary | ICD-10-CM

## 2019-04-28 DIAGNOSIS — M150 Primary generalized (osteo)arthritis: Secondary | ICD-10-CM

## 2019-04-28 DIAGNOSIS — E782 Mixed hyperlipidemia: Secondary | ICD-10-CM

## 2019-04-28 DIAGNOSIS — R002 Palpitations: Secondary | ICD-10-CM

## 2019-04-28 DIAGNOSIS — L237 Allergic contact dermatitis due to plants, except food: Secondary | ICD-10-CM | POA: Diagnosis not present

## 2019-04-28 DIAGNOSIS — M8949 Other hypertrophic osteoarthropathy, multiple sites: Secondary | ICD-10-CM

## 2019-04-28 DIAGNOSIS — M1A9XX0 Chronic gout, unspecified, without tophus (tophi): Secondary | ICD-10-CM | POA: Diagnosis not present

## 2019-04-28 DIAGNOSIS — E119 Type 2 diabetes mellitus without complications: Secondary | ICD-10-CM

## 2019-04-28 DIAGNOSIS — E1169 Type 2 diabetes mellitus with other specified complication: Secondary | ICD-10-CM | POA: Diagnosis not present

## 2019-04-28 MED ORDER — TRIAMCINOLONE ACETONIDE 0.1 % EX CREA: 1.0000 "application " | TOPICAL_CREAM | Freq: Two times a day (BID) | CUTANEOUS | 0 refills | Status: DC

## 2019-04-28 NOTE — Progress Notes (Signed)
Careteam: Patient Care Team: Sharon SellerEubanks, Zorion Nims K, NP as PCP - General (Geriatric Medicine)  Advanced Directive information Does Patient Have a Medical Advance Directive?: Yes, Type of Advance Directive: Healthcare Power of Attorney, Does patient want to make changes to medical advance directive?: No - Patient declined  Allergies  Allergen Reactions  . Bactrim [Sulfamethoxazole-Trimethoprim] Diarrhea  . Clindamycin/Lincomycin Rash and Diarrhea    Terrible stomach cramps per patient  . Oxycodone Nausea And Vomiting and Nausea Only  . Penicillins Rash, Hives and Swelling    Chief Complaint  Patient presents with  . Medical Management of Chronic Issues    4 month follow-up, rash on both arms, right eyelid and right thigh, onset 04/12/2019. Rash is spreading. Patient thinks it is poision ivy. Patient also c/o racing heart, spoke with cardiology and has a follow-up telephone visit today (FYI).   . Quality Metric Gaps    A1c due  . Immunizations    Discuss need for TDaP, RX for Shingrix provided in the past   . Fasting Blood Sugar    97 this morning (home reading)      HPI: Patient is a 78 y.o. female seen in the office today for routine follow up.   Was doing yard work around 04/12/2019 and afterwards developed an itchy rash to right forearm, this has become large and very itchy. Now small area on left forearm and right upper thigh Using hydrocortisone without help  Feels like it is poison ivy   Had a phone visit with cardiologist due to palpitations, she was told to quit alcohol (and other things) states things have been going good. No ongoing issues but has follow up today.   Gout- continues on allopurinol, no recent flares.   htn- 133/78 this morning at home. HR 66. Elevated in the doctors office bc "I am here"  Hyperlipidemia-   RA- pt with hx of RA and was on methotrexate but Dr Montez Moritaarter took her off methotrexate. She was originally prescribed by a rheumatologist She has  been off this for over a year.  No worsening of joint pain or swelling.     Elevated calcium and PTH - this has been long standing and stable   Review of Systems:  Review of Systems  Constitutional: Negative for chills, fever, malaise/fatigue and weight loss.  HENT: Negative for hearing loss.   Respiratory: Negative for cough, sputum production and shortness of breath.   Cardiovascular: Negative for chest pain, palpitations and leg swelling.  Gastrointestinal: Negative for abdominal pain, constipation, diarrhea and heartburn.  Genitourinary: Negative for dysuria, frequency and urgency.  Musculoskeletal: Negative for back pain, falls, joint pain and myalgias.  Skin: Positive for itching and rash.  Neurological: Negative for dizziness, weakness and headaches.  Psychiatric/Behavioral: Negative for depression and memory loss. The patient does not have insomnia.     Past Medical History:  Diagnosis Date  . Arthritis   . DM (diabetes mellitus) (HCC)   . Hypercalcemia   . Hypercholesteremia   . Hypertension   . Rheumatoid arteritis (HCC)    Past Surgical History:  Procedure Laterality Date  . bladder prolapse  2018   Dr. Cranston NeighborLydia Labocetta  . carpal tunnel Bilateral 1998  . DILATION AND CURETTAGE OF UTERUS  1964   Dr. Alease Medinaean Martin  . MENISCUS REPAIR Right 2010   Dr. Cranston NeighborLydia Labocetta  . PARTIAL HYSTERECTOMY  1973   ovaries remain  . Stroke  1994   TIA  . TONSILLECTOMY  1956  Social History:   reports that she has quit smoking. She has never used smokeless tobacco. She reports current alcohol use of about 1.0 - 2.0 standard drinks of alcohol per week. She reports that she does not use drugs.  Family History  Problem Relation Age of Onset  . Hypertension Son   . Hypertension Son   . Hypertension Son     Medications: Patient's Medications  New Prescriptions   No medications on file  Previous Medications   ACCU-CHEK FASTCLIX LANCETS MISC    Check blood sugar once daily E11.9    ALLOPURINOL (ZYLOPRIM) 300 MG TABLET    TAKE 1 TABLET DAILY   AMLODIPINE (NORVASC) 5 MG TABLET    TAKE 1 TABLET DAILY   ASPIRIN EC 325 MG TABLET    Take 325 mg by mouth daily.   BIOTIN 5000 PO    Take 1 capsule by mouth daily.   FOLIC ACID (FOLVITE) 400 MCG TABLET    Take 800 mcg by mouth daily.    GLUCOSE BLOOD (ACCU-CHEK AVIVA PLUS) TEST STRIP    Check blood sugar once daily E11.9   HYDROCHLOROTHIAZIDE (HYDRODIURIL) 25 MG TABLET    TAKE 1 TABLET DAILY   LOSARTAN (COZAAR) 100 MG TABLET    Take 1 tablet (100 mg total) by mouth daily.   METFORMIN (GLUCOPHAGE) 500 MG TABLET    TAKE 1 TABLET DAILY   PRAVASTATIN (PRAVACHOL) 40 MG TABLET    TAKE 1 TABLET DAILY   TIMOLOL (BETIMOL) 0.5 % OPHTHALMIC SOLUTION    Place 1 drop into both eyes daily.  Modified Medications   No medications on file  Discontinued Medications   No medications on file    Physical Exam:  Vitals:   04/28/19 1048  BP: (!) 148/84  Pulse: 79  Temp: 98.3 F (36.8 C)  TempSrc: Oral  SpO2: 98%  Weight: 168 lb (76.2 kg)  Height: 5\' 2"  (1.575 m)   Body mass index is 30.73 kg/m. Wt Readings from Last 3 Encounters:  04/28/19 168 lb (76.2 kg)  02/12/19 167 lb (75.8 kg)  12/23/18 168 lb (76.2 kg)    Physical Exam Constitutional:      Appearance: Normal appearance. She is well-developed.  HENT:     Head: Normocephalic and atraumatic.     Mouth/Throat:     Pharynx: No oropharyngeal exudate.  Eyes:     General: No scleral icterus.    Pupils: Pupils are equal, round, and reactive to light.  Neck:     Musculoskeletal: Neck supple.     Thyroid: No thyromegaly.     Vascular: No carotid bruit.     Trachea: No tracheal deviation.  Cardiovascular:     Rate and Rhythm: Normal rate and regular rhythm.     Heart sounds: Murmur (1/6 SEM) present. No friction rub. No gallop.      Comments: No LE edema b/l. no calf TTP.  Pulmonary:     Effort: Pulmonary effort is normal. No respiratory distress.     Breath sounds:  Normal breath sounds. No stridor. No wheezing or rales.  Abdominal:     General: Bowel sounds are normal.     Palpations: Abdomen is soft. Abdomen is not rigid. There is no hepatomegaly.     Comments: obese  Lymphadenopathy:     Cervical: No cervical adenopathy.  Skin:    General: Skin is warm and dry.     Findings: Rash present. Rash is macular and vesicular.     Comments:  Clustered rash to left forearm, small patch to right forearm and left upper leg.  Neurological:     Mental Status: She is alert and oriented to person, place, and time.     Deep Tendon Reflexes: Reflexes are normal and symmetric.  Psychiatric:        Behavior: Behavior normal.        Thought Content: Thought content normal.        Judgment: Judgment normal.     Labs reviewed: Basic Metabolic Panel: Recent Labs    06/08/18 0840 09/21/18 0934 09/25/18 1047 11/10/18 1051 12/23/18 0924  NA 137 138  --   --  142  K 4.2 4.0  --   --  3.9  CL 102 100  --   --  104  CO2 26 29  --   --  31  GLUCOSE 120* 116*  --   --  108*  BUN 21 20  --   --  18  CREATININE 0.64 0.65  --   --  0.65  CALCIUM 10.8* 11.1* 10.8* 10.6* 10.5*  10.5*  TSH  --  2.31  --   --  1.72   Liver Function Tests: Recent Labs    06/08/18 0840 09/21/18 0934 12/23/18 0924  AST 19 18 16   ALT 16 16 14   BILITOT 1.0 0.8 0.6  PROT 6.3 6.4 6.4   No results for input(s): LIPASE, AMYLASE in the last 8760 hours. No results for input(s): AMMONIA in the last 8760 hours. CBC: Recent Labs    06/08/18 0840 09/21/18 0934 12/23/18 0924  WBC 6.1 8.4 6.3  NEUTROABS 3,209 4,931 3,427  HGB 13.8 13.9 13.6  HCT 41.1 41.0 39.3  MCV 96.3 96.0 94.5  PLT 281 275 304   Lipid Panel: Recent Labs    06/08/18 0840 09/21/18 0934 12/23/18 0924  CHOL 159 195 160  HDL 57 60 64  LDLCALC 80 105* 78  TRIG 123 181* 99  CHOLHDL 2.8 3.3 2.5   TSH: Recent Labs    09/21/18 0934 12/23/18 0924  TSH 2.31 1.72   A1C: Lab Results  Component Value Date    HGBA1C 5.8 (H) 09/21/2018     Assessment/Plan 1. Hypercalcemia -has been stable, off all supplements. Will followup.  - COMPLETE METABOLIC PANEL WITH GFR  2. Mixed hyperlipidemia Controlled on pravastatin 40 mg daily  - COMPLETE METABOLIC PANEL WITH GFR - Lipid Panel  3. Type 2 diabetes mellitus with other specified complication, without long-term current use of insulin (HCC) Controlled on metformin and diet. Will follow up a1c - Hemoglobin A1c  4. Chronic gout without tophus, unspecified cause, unspecified site No recent flares, continues on allopurinol 300 mg daily - Uric acid  5. Contact dermatitis due to poison ivy - triamcinolone cream (KENALOG) 0.1 %; Apply 1 application topically 2 (two) times daily.  Dispense: 30 g; Refill: 0 -to notify if area worsens, heat, pain, drainage or fever occurs.   6. Primary osteoarthritis involving multiple joints -stable without worsening in pain.   7. Essential hypertension with goal blood pressure less than 140/90 -elevated in office (states this is because she is in office) and blood pressure at home is more controlled. Continues on norvasc, losartan, hctz with dietary modifications.   Next appt: 6 months.  Carlos American. [place], [facility] [phone]

## 2019-04-28 NOTE — Progress Notes (Signed)
Virtual Visit via Telephone Note   This visit type was conducted due to national recommendations for restrictions regarding the COVID-19 Pandemic (e.g. social distancing) in an effort to limit this patient's exposure and mitigate transmission in our community.  Due to her co-morbid illnesses, this patient is at least at moderate risk for complications without adequate follow up.  This format is felt to be most appropriate for this patient at this time.  The patient did not have access to video technology/had technical difficulties with video requiring transitioning to audio format only (telephone).  All issues noted in this document were discussed and addressed.  No physical exam could be performed with this format.  Please refer to the patient's chart for her  consent to telehealth for [facility].   Date:  04/28/2019   ID:  Terrill Mohr, DOB 02-06-1941, MRN [id number]  Patient Location: Home Provider Location: Office  PCP:  Lauree Chandler, NP  Cardiologist:  No primary care provider on file.  Electrophysiologist:  None   Evaluation Performed:  Follow-Up Visit  Chief Complaint:  Palpitations f/u  History of Present Illness:    Debra Madden is a 78 y.o. female with DM2 >15 years, HTN, HLD, TIA, osteoarthritis, gout, and hypercalcemia who presents today for follow up of palpitations. She endorses no more racing heart incidents, cut back on caffeine and etoh.   The patient denies chest pain, chest pressure, dyspnea at rest or with exertion, palpitations, PND, orthopnea, or leg swelling. Denies syncope or presyncope. Denies dizziness or lightheadedness. Denies snoring and has not been evaluated for sleep apnea.  The patient does not have symptoms concerning for COVID-19 infection (fever, chills, cough, or new shortness of breath).    Past Medical History:  Diagnosis Date  . Arthritis   . DM (diabetes mellitus) ([place])   . Hypercalcemia   . Hypercholesteremia   . Hypertension    . Rheumatoid arteritis ([place])    Past Surgical History:  Procedure Laterality Date  . bladder prolapse  2018   Dr. Ladean Raya  . carpal tunnel Bilateral 1998  . DILATION AND CURETTAGE OF UTERUS  1964   Dr. Leonia Reader  . MENISCUS REPAIR Right 2010   Dr. Ladean Raya  . PARTIAL HYSTERECTOMY  1973   ovaries remain  . Stroke  1994   TIA  . TONSILLECTOMY  1956     Current Meds  Medication Sig  . ACCU-CHEK FASTCLIX LANCETS MISC Check blood sugar once daily E11.9  . allopurinol (ZYLOPRIM) 300 MG tablet TAKE 1 TABLET DAILY  . amLODipine (NORVASC) 5 MG tablet TAKE 1 TABLET DAILY  . aspirin EC 325 MG tablet Take 325 mg by mouth daily.  Marland Kitchen BIOTIN 5000 PO Take 1 capsule by mouth daily.  . folic acid (FOLVITE) 024 MCG tablet Take 800 mcg by mouth daily.   Marland Kitchen glucose blood (ACCU-CHEK AVIVA PLUS) test strip Check blood sugar once daily E11.9  . hydrochlorothiazide (HYDRODIURIL) 25 MG tablet TAKE 1 TABLET DAILY  . losartan (COZAAR) 100 MG tablet Take 1 tablet (100 mg total) by mouth daily.  . metFORMIN (GLUCOPHAGE) 500 MG tablet TAKE 1 TABLET DAILY  . pravastatin (PRAVACHOL) 40 MG tablet TAKE 1 TABLET DAILY  . timolol (BETIMOL) 0.5 % ophthalmic solution Place 1 drop into both eyes daily.  . [DISCONTINUED] triamcinolone cream (KENALOG) 0.1 % Apply 1 application topically 2 (two) times daily.     Allergies:   Bactrim [sulfamethoxazole-trimethoprim], Clindamycin/lincomycin, Oxycodone, and Penicillins   Social History  Tobacco Use  . Smoking status: Former Games developer  . Smokeless tobacco: Never Used  . Tobacco comment: Quit at age 57  Substance Use Topics  . Alcohol use: Yes    Alcohol/week: 1.0 - 2.0 standard drinks    Types: 1 - 2 Glasses of wine per week  . Drug use: Never     Family Hx: The patient's family history includes Hypertension in her son, son, and son.  ROS:   Please see the history of present illness.     All other systems reviewed and are negative.   Prior  CV studies:   The following studies were reviewed today:    Labs/Other Tests and Data Reviewed:    EKG:  No ECG reviewed.  Recent Labs: 12/23/2018: Hemoglobin 13.6; Platelets 304; TSH 1.72 04/28/2019: ALT 17; BUN 16; Creat 0.59; Potassium 3.8; Sodium 137   Recent Lipid Panel Lab Results  Component Value Date/Time   CHOL 200 (H) 04/28/2019 11:14 AM   TRIG 237 (H) 04/28/2019 11:14 AM   HDL 57 04/28/2019 11:14 AM   CHOLHDL 3.5 04/28/2019 11:14 AM   LDLCALC 107 (H) 04/28/2019 11:14 AM    Wt Readings from Last 3 Encounters:  04/28/19 168 lb (76.2 kg)  04/28/19 168 lb (76.2 kg)  02/12/19 167 lb (75.8 kg)     Objective:    Vital Signs:  BP 133/78   Pulse 66   Ht 5\' 2"  (1.575 m)   Wt 168 lb (76.2 kg)   SpO2 98%   BMI 30.73 kg/m    VITAL SIGNS:  reviewed GEN:  no acute distress  ASSESSMENT & PLAN:    1. Palpitations   2. Mixed hyperlipidemia   3. Essential hypertension with goal blood pressure less than 140/90   4. Type 2 diabetes mellitus without complication, without long-term current use of insulin (HCC)    She has made lifestyle modifications and has not had additional episodes of palpitations. We will continue to observe and she will contact me if she has recurrence of symptoms.   HTN - stable, continue amlodipine, HCTZ, and losartan.   HLD - suboptimally controlled, would consider intensification of statin therapy and recheck lipids in 3 mo for response. Consider rosuvastatin 20 mg daily.   COVID-19 Education: The signs and symptoms of COVID-19 were discussed with the patient and how to seek care for testing (follow up with PCP or arrange E-visit).  The importance of social distancing was discussed today.  Time:   Today, I have spent 25 minutes with the patient with telehealth technology discussing the above problems.     Medication Adjustments/Labs and Tests Ordered: Current medicines are reviewed at length with the patient today.  Concerns regarding medicines  are outlined above.   Tests Ordered: No orders of the defined types were placed in this encounter.   Medication Changes: No orders of the defined types were placed in this encounter.   Follow Up:  Virtual Visit or In Person in 6 month(s)  Signed, , MD  04/28/2019 10:43 AM     [facility]

## 2019-04-28 NOTE — Patient Instructions (Addendum)
To use triamcinolone cream twice daily to rash If area becomes larger, painful, drainage or hot to notify us

## 2019-04-28 NOTE — Patient Instructions (Signed)
Medication Instructions:  Your physician recommends that you continue on your current medications as directed. Please refer to the Current Medication list given to you today.  If you need a refill on your cardiac medications before your next appointment, please call your pharmacy.   Lab work: NONE  Testing/Procedures: NONE  Follow-Up: At [facility], you and your health needs are our priority.  As part of our continuing mission to provide you with exceptional heart care, we have created designated Provider Care Teams.  These Care Teams include your primary Cardiologist (physician) and Advanced Practice Providers (APPs -  Physician Assistants and Nurse Practitioners) who all work together to provide you with the care you need, when you need it. You will need a follow up appointment in 6 months.  Please call our office 2 months in advance to schedule this appointment.  You may see DR Margaretann Loveless  or one of the following Advanced Practice Providers on your designated Care Team:   Rosaria Ferries, PA-C . Jory Sims, DNP, ANP

## 2019-04-29 LAB — LIPID PANEL
Cholesterol: 200 mg/dL — ABNORMAL HIGH (ref <200)
HDL: 57 mg/dL (ref >=50)
LDL Cholesterol (Calc): 107 mg/dL (calc) — ABNORMAL HIGH
Non-HDL Cholesterol (Calc): 143 mg/dL (calc) — ABNORMAL HIGH (ref <130)
Total CHOL/HDL Ratio: 3.5 (calc) (ref <5.0)
Triglycerides: 237 mg/dL — ABNORMAL HIGH (ref <150)

## 2019-04-29 LAB — COMPLETE METABOLIC PANEL WITH GFR
AG Ratio: 2.3 (calc) (ref 1.0–2.5)
ALT: 17 U/L (ref 6–29)
AST: 19 U/L (ref 10–35)
Albumin: 4.8 g/dL (ref 3.6–5.1)
Alkaline phosphatase (APISO): 86 U/L (ref 37–153)
BUN/Creatinine Ratio: 27 (calc) — ABNORMAL HIGH (ref 6–22)
BUN: 16 mg/dL (ref 7–25)
CO2: 27 mmol/L (ref 20–32)
Calcium: 11.6 mg/dL — ABNORMAL HIGH (ref 8.6–10.4)
Chloride: 100 mmol/L (ref 98–110)
Creat: 0.59 mg/dL — ABNORMAL LOW (ref 0.60–0.93)
GFR, Est African American: 102 mL/min/{1.73_m2} (ref >=60)
GFR, Est Non African American: 88 mL/min/{1.73_m2} (ref >=60)
Globulin: 2.1 g/dL (calc) (ref 1.9–3.7)
Glucose, Bld: 114 mg/dL — ABNORMAL HIGH (ref 65–99)
Potassium: 3.8 mmol/L (ref 3.5–5.3)
Sodium: 137 mmol/L (ref 135–146)
Total Bilirubin: 1.1 mg/dL (ref 0.2–1.2)
Total Protein: 6.9 g/dL (ref 6.1–8.1)

## 2019-04-29 LAB — HEMOGLOBIN A1C
Hgb A1c MFr Bld: 5.9 % of total Hgb — ABNORMAL HIGH (ref <5.7)
Mean Plasma Glucose: 123 (calc)
eAG (mmol/L): 6.8 (calc)

## 2019-04-29 LAB — URIC ACID: Uric Acid, Serum: 4.4 mg/dL (ref 2.5–7.0)

## 2019-04-30 ENCOUNTER — Other Ambulatory Visit: Payer: Self-pay | Admitting: Nurse Practitioner

## 2019-04-30 DIAGNOSIS — L237 Allergic contact dermatitis due to plants, except food: Secondary | ICD-10-CM

## 2019-04-30 MED ORDER — PREDNISONE 20 MG PO TABS: ORAL_TABLET | ORAL | 0 refills | Status: DC

## 2019-05-07 ENCOUNTER — Other Ambulatory Visit: Payer: Self-pay

## 2019-05-11 ENCOUNTER — Other Ambulatory Visit: Payer: Self-pay | Admitting: Nurse Practitioner

## 2019-05-12 ENCOUNTER — Other Ambulatory Visit: Payer: Self-pay

## 2019-05-12 ENCOUNTER — Other Ambulatory Visit: Payer: Medicare HMO

## 2019-05-13 LAB — CALCIUM, IONIZED: Calcium, Ion: 5.69 mg/dL — ABNORMAL HIGH (ref 4.8–5.6)

## 2019-05-13 LAB — PTH, INTACT AND CALCIUM
Calcium: 10.6 mg/dL — ABNORMAL HIGH (ref 8.6–10.4)
PTH: 45 pg/mL (ref 14–64)

## 2019-05-13 LAB — VITAMIN D 25 HYDROXY (VIT D DEFICIENCY, FRACTURES): Vit D, 25-Hydroxy: 28 ng/mL — ABNORMAL LOW (ref 30–100)

## 2019-05-14 ENCOUNTER — Other Ambulatory Visit: Payer: Self-pay

## 2019-05-14 DIAGNOSIS — L237 Allergic contact dermatitis due to plants, except food: Secondary | ICD-10-CM

## 2019-05-14 MED ORDER — TRIAMCINOLONE ACETONIDE 0.1 % EX CREA: 1.0000 "application " | TOPICAL_CREAM | Freq: Two times a day (BID) | CUTANEOUS | 0 refills | Status: DC

## 2019-06-14 ENCOUNTER — Other Ambulatory Visit: Payer: Self-pay | Admitting: Nurse Practitioner

## 2019-06-14 DIAGNOSIS — I10 Essential (primary) hypertension: Secondary | ICD-10-CM

## 2019-06-16 ENCOUNTER — Encounter: Payer: Self-pay | Admitting: Nurse Practitioner

## 2019-07-09 DIAGNOSIS — H2513 Age-related nuclear cataract, bilateral: Secondary | ICD-10-CM | POA: Diagnosis not present

## 2019-07-09 DIAGNOSIS — H5213 Myopia, bilateral: Secondary | ICD-10-CM | POA: Diagnosis not present

## 2019-07-09 DIAGNOSIS — H401331 Pigmentary glaucoma, bilateral, mild stage: Secondary | ICD-10-CM | POA: Diagnosis not present

## 2019-08-20 DIAGNOSIS — H2513 Age-related nuclear cataract, bilateral: Secondary | ICD-10-CM | POA: Diagnosis not present

## 2019-08-20 DIAGNOSIS — H401331 Pigmentary glaucoma, bilateral, mild stage: Secondary | ICD-10-CM | POA: Diagnosis not present

## 2019-08-30 ENCOUNTER — Other Ambulatory Visit: Payer: Self-pay | Admitting: Nurse Practitioner

## 2019-08-30 DIAGNOSIS — I10 Essential (primary) hypertension: Secondary | ICD-10-CM

## 2019-09-27 ENCOUNTER — Other Ambulatory Visit: Payer: Self-pay

## 2019-09-27 ENCOUNTER — Other Ambulatory Visit: Payer: Self-pay | Admitting: Nurse Practitioner

## 2019-09-27 ENCOUNTER — Ambulatory Visit: Payer: Self-pay

## 2019-09-27 ENCOUNTER — Ambulatory Visit (INDEPENDENT_AMBULATORY_CARE_PROVIDER_SITE_OTHER): Payer: Medicare HMO | Admitting: Nurse Practitioner

## 2019-09-27 ENCOUNTER — Encounter: Payer: Self-pay | Admitting: Nurse Practitioner

## 2019-09-27 DIAGNOSIS — Z0000 Encounter for general adult medical examination without abnormal findings: Secondary | ICD-10-CM | POA: Diagnosis not present

## 2019-09-27 NOTE — Progress Notes (Signed)
Subjective:   Debra Madden is a 78 y.o. female who presents for Medicare Annual (Subsequent) preventive examination.  Review of Systems:   Cardiac Risk Factors include: advanced age (>5555men, 80>65 women);diabetes mellitus;family history of premature cardiovascular disease;dyslipidemia;hypertension;obesity (BMI >30kg/m2)     Objective:     Vitals: There were no vitals taken for this visit.  There is no height or weight on file to calculate BMI.  Advanced Directives 09/27/2019 04/28/2019 09/23/2018 06/18/2018 03/17/2018  Does Patient Have a Medical Advance Directive? Yes Yes Yes Yes Yes  Type of Sales promotion account executiveAdvance Directive Healthcare Power of Attorney Healthcare Power of [employer] Healthcare Power of [employer] Healthcare Power of Attorney Healthcare Power of Attorney  Does patient want to make changes to medical advance directive? No - Patient declined No - Patient declined No - Patient declined - No - Patient declined  Copy of Healthcare Power of Attorney in Chart? Yes - validated most recent copy scanned in chart (See row information) Yes - validated most recent copy scanned in chart (See row information) Yes - validated most recent copy scanned in chart (See row information) Yes No - copy requested    Tobacco Social History   Tobacco Use  Smoking Status Former Smoker  . Types: Cigarettes  Smokeless Tobacco Never Used  Tobacco Comment   Quit at age 78     Counseling given: Not Answered Comment: Quit at age 78   Clinical Intake:  Pre-visit preparation completed: Yes  Pain : 0-10 Pain Score: 8  Pain Type: Chronic pain Pain Location: Knee Pain Orientation: Right Pain Descriptors / Indicators: Aching, Nagging, Tender, Throbbing Pain Onset: More than a month ago Pain Frequency: Intermittent(worse in the morning or sitting but once she gets up and walks around it is better) Pain Relieving Factors: walking and tylenol Effect of Pain on Daily Activities: none  Pain Relieving Factors:  walking and tylenol  BMI - recorded: 30.73 Nutritional Status: BMI > 30  Obese Nutritional Risks: None Diabetes: Yes  How often do you need to have someone help you when you read instructions, pamphlets, or other written materials from your doctor or pharmacy?: 1 - Never What is the last grade level you completed in school?: [employer] degree        Past Medical History:  Diagnosis Date  . Arthritis   . DM (diabetes mellitus) (HCC)   . Hypercalcemia   . Hypercholesteremia   . Hypertension   . Rheumatoid arteritis (HCC)    Past Surgical History:  Procedure Laterality Date  . bladder prolapse  2018   Dr. Cranston NeighborLydia Labocetta  . carpal tunnel Bilateral 1998  . DILATION AND CURETTAGE OF UTERUS  1964   Dr. Alease Medinaean Martin  . MENISCUS REPAIR Right 2010   Dr. Cranston NeighborLydia Labocetta  . PARTIAL HYSTERECTOMY  1973   ovaries remain  . Stroke  1994   TIA  . TONSILLECTOMY  1956   Family History  Problem Relation Age of Onset  . Hypertension Son   . Hypertension Son   . Hypertension Son    Social History   Socioeconomic History  . Marital status: Married    Spouse name: Not on file  . Number of children: Not on file  . Years of education: Not on file  . Highest education level: Not on file  Occupational History  . Not on file  Social Needs  . Financial resource strain: Not hard at all  . Food insecurity    Worry: Never true  Inability: Never true  . Transportation needs    Medical: No    Non-medical: No  Tobacco Use  . Smoking status: Former Smoker    Types: Cigarettes  . Smokeless tobacco: Never Used  . Tobacco comment: Quit at age 31  Substance and Sexual Activity  . Alcohol use: Yes    Alcohol/week: 1.0 - 2.0 standard drinks    Types: 1 - 2 Glasses of wine per week  . Drug use: Never  . Sexual activity: Not Currently  Lifestyle  . Physical activity    Days per week: 0 days    Minutes per session: 0 min  . Stress: Only a little  Relationships  . Social Wellsite geologist on phone: Three times a week    Gets together: Once a week    Attends religious service: More than 4 times per year    Active member of club or organization: No    Attends meetings of clubs or organizations: Never    Relationship status: Married  Other Topics Concern  . Not on file  Social History Narrative   Social History      Diet? Meats, fish, salads, occasionally shellfish, eggs, vegs, broc, cauliflower, brusel sprouts, green beans, mixed vegs, potatoes, some pasta/rice.       Do you drink/eat things with caffeine? Coffee- 1 1/2 cup per day      Marital status?           married                         What year were you married? 1964      Do you live in a house, apartment, assisted living, condo, trailer, etc.? house      Is it one or more stories? One- no stairs      How many persons live in your home? 2      Do you have any pets in your home? (please list) no      Highest level of education completed? 4 year college degree      Current or past profession: teacher      Do you exercise?         yes                             Type & how often? Walk, water aerobics      Advanced Directives      Do you have a living will? yes      Do you have a DNR form?        no                          If not, do you want to discuss one?      Do you have signed POA/HPOA for forms? yes      Functional Status      Do you have difficulty bathing or dressing yourself? no      Do you have difficulty preparing food or eating? no      Do you have difficulty managing your medications? no      Do you have difficulty managing your finances? no      Do you have difficulty affording your medications? no    Outpatient Encounter Medications as of 09/27/2019  Medication Sig  . ACCU-CHEK FASTCLIX LANCETS MISC Check blood  sugar once daily E11.9  . allopurinol (ZYLOPRIM) 300 MG tablet TAKE 1 TABLET DAILY  . amLODipine (NORVASC) 5 MG tablet TAKE 1 TABLET DAILY  . aspirin EC 325 MG  tablet Take 325 mg by mouth daily.  Marland Kitchen BIOTIN 5000 PO Take 1 capsule by mouth daily.  . folic acid (FOLVITE) 400 MCG tablet Take 800 mcg by mouth daily.   Marland Kitchen glucose blood (ACCU-CHEK AVIVA PLUS) test strip Check blood sugar once daily E11.9  . hydrochlorothiazide (HYDRODIURIL) 25 MG tablet TAKE 1 TABLET DAILY  . latanoprost (XALATAN) 0.005 % ophthalmic solution Place 1 drop into both eyes at bedtime.  Marland Kitchen losartan (COZAAR) 100 MG tablet TAKE 1 TABLET BY MOUTH EVERY DAY  . metFORMIN (GLUCOPHAGE) 500 MG tablet TAKE 1 TABLET DAILY  . pravastatin (PRAVACHOL) 40 MG tablet TAKE 1 TABLET DAILY  . [DISCONTINUED] predniSONE (DELTASONE) 20 MG tablet 60 mg today (at once), 40 mg x 2 days, 20 mg daily for 7 days  . [DISCONTINUED] timolol (BETIMOL) 0.5 % ophthalmic solution Place 1 drop into both eyes daily.  . [DISCONTINUED] triamcinolone cream (KENALOG) 0.1 % Apply 1 application topically 2 (two) times daily.   No facility-administered encounter medications on file as of 09/27/2019.     Activities of Daily Living In your present state of health, do you have any difficulty performing the following activities: 09/27/2019  Hearing? N  Vision? Y  Comment needs cataract surgery  Difficulty concentrating or making decisions? N  Walking or climbing stairs? Y  Dressing or bathing? N  Doing errands, shopping? N  Preparing Food and eating ? N  Using the Toilet? N  In the past six months, have you accidently leaked urine? N  Do you have problems with loss of bowel control? N  Managing your Medications? N  Managing your Finances? N  Housekeeping or managing your Housekeeping? N  Some recent data might be hidden    Patient Care Team: Sharon Seller, NP as PCP - General (Geriatric Medicine)    Assessment:   This is a routine wellness examination for Emmali.  Exercise Activities and Dietary recommendations Current Exercise Habits: The patient does not participate in regular exercise at present  Goals     . Weight (lb) < 160 lb (72.6 kg)     Weight loss through diet.        Fall Risk Fall Risk  09/27/2019 04/28/2019 12/23/2018 09/23/2018 03/17/2018  Falls in the past year? 0 0 0 0 No  Number falls in past yr: 0 0 0 0 -  Injury with Fall? 0 0 0 0 -   Is the patient's home free of loose throw rugs in walkways, pet beds, electrical cords, etc?   yes      Grab bars in the bathroom? yes      Handrails on the stairs?   No stairs      Adequate lighting?   yes  Timed Get Up and Go performed: na  Depression Screen PHQ 2/9 Scores 09/27/2019 09/23/2018 03/17/2018  PHQ - 2 Score 0 0 0     Cognitive Function MMSE - Mini Mental State Exam 09/23/2018  Orientation to time 5  Orientation to Place 5  Registration 3  Attention/ Calculation 5  Recall 2  Language- name 2 objects 2  Language- repeat 1  Language- follow 3 step command 3  Language- read & follow direction 1  Write a sentence 1  Copy design 1  Total score 29  6CIT Screen 09/27/2019  What Year? 0 points  What month? 0 points  What time? 0 points  Count back from 20 0 points  Months in reverse 0 points  Repeat phrase 2 points  Total Score 2    Immunization History  Administered Date(s) Administered  . Influenza Split 07/20/2012  . Influenza, High Dose Seasonal PF 07/06/2014, 07/04/2016, 07/17/2017  . Influenza, Quadrivalent, Recombinant, Inj, Pf 07/07/2013  . Influenza, Seasonal, Injecte, Preservative Fre 08/11/2015  . Influenza-Unspecified 06/21/2018, 06/16/2019  . Pneumococcal Conjugate-13 04/06/2015, 09/05/2016  . Pneumococcal Polysaccharide-23 08/05/2012, 10/21/2014  . Zoster 09/05/2016    Qualifies for Shingles Vaccine?yes  Screening Tests Health Maintenance  Topic Date Due  . TETANUS/TDAP  04/21/1960  . OPHTHALMOLOGY EXAM  07/11/2019  . FOOT EXAM  09/24/2019  . HEMOGLOBIN A1C  10/29/2019  . INFLUENZA VACCINE  Completed  . DEXA SCAN  Completed  . PNA vac Low Risk Adult  Completed    Cancer Screenings:  Lung: Low Dose CT Chest recommended if Age 9-80 years, 30 pack-year currently smoking OR have quit w/in 15years. Patient does not qualify. Breast:  Up to date on Mammogram? Yes   Up to date of Bone Density/Dexa? Yes Colorectal: aged out  Additional Screenings:  Hepatitis C Screening: na     Plan:     I have personally reviewed and noted the following in the patient's chart:   . Medical and social history . Use of alcohol, tobacco or illicit drugs  . Current medications and supplements . Functional ability and status . Nutritional status . Physical activity . Advanced directives . List of other physicians . Hospitalizations, surgeries, and ER visits in previous 12 months . Vitals . Screenings to include cognitive, depression, and falls . Referrals and appointments  In addition, I have reviewed and discussed with patient certain preventive protocols, quality metrics, and best practice recommendations. A written personalized care plan for preventive services as well as general preventive health recommendations were provided to patient.     Lauree Chandler, NP  09/27/2019

## 2019-09-27 NOTE — Progress Notes (Signed)
   This service is provided via telemedicine  No vital signs collected/recorded due to the encounter was a telemedicine visit.   Location of patient (ex: home, work): Home  Patient consents to a telephone visit:  Yes  Location of the provider (ex: office, home): [facility], Office   Name of any referring provider:  N/A  Names of all persons participating in the telemedicine service and their role in the encounter: S.Chrae B/CMA, Jessica Eubanks, NP, and Patient   Time spent on call:  8 min with medical assistant  

## 2019-09-27 NOTE — Patient Instructions (Addendum)
Debra Madden , Thank you for taking time to come for your Medicare Wellness Visit. I appreciate your ongoing commitment to your health goals. Please review the following plan we discussed and let me know if I can assist you in the future.   Screening recommendations/referrals: Colonoscopy aged out Mammogram aged out Bone Density up to date Recommended yearly ophthalmology/optometry visit for glaucoma screening and checkup Recommended yearly dental visit for hygiene and checkup  Vaccinations: Influenza vaccine up to date Pneumococcal vaccine up to date Tdap vaccine you have declined this vaccine however we still recommend and if you change your mind you to get at local pharmacy Shingles vaccine DUE- recommended to get at local pharmacy    Advanced directives: on file   Conditions/risks identified: risk for complications associated   Next appointment: 1 year   Preventive Care 78 Years and Older, Female Preventive care refers to lifestyle choices and visits with your health care provider that can promote health and wellness. What does preventive care include?  A yearly physical exam. This is also called an annual well check.  Dental exams once or twice a year.  Routine eye exams. Ask your health care provider how often you should have your eyes checked.  Personal lifestyle choices, including:  Daily care of your teeth and gums.  Regular physical activity.  Eating a healthy diet.  Avoiding tobacco and drug use.  Limiting alcohol use.  Practicing safe sex.  Taking low-dose aspirin every day.  Taking vitamin and mineral supplements as recommended by your health care provider. What happens during an annual well check? The services and screenings done by your health care provider during your annual well check will depend on your age, overall health, lifestyle risk factors, and family history of disease. Counseling  Your health care provider may ask you questions about  your:  Alcohol use.  Tobacco use.  Drug use.  Emotional well-being.  Home and relationship well-being.  Sexual activity.  Eating habits.  History of falls.  Memory and ability to understand (cognition).  Work and work Statistician.  Reproductive health. Screening  You may have the following tests or measurements:  Height, weight, and BMI.  Blood pressure.  Lipid and cholesterol levels. These may be checked every 5 years, or more frequently if you are over 34 years old.  Skin check.  Lung cancer screening. You may have this screening every year starting at age 19 if you have a 30-pack-year history of smoking and currently smoke or have quit within the past 15 years.  Fecal occult blood test (FOBT) of the stool. You may have this test every year starting at age 78.  Flexible sigmoidoscopy or colonoscopy. You may have a sigmoidoscopy every 5 years or a colonoscopy every 10 years starting at age 70.  Hepatitis C blood test.  Hepatitis B blood test.  Sexually transmitted disease (STD) testing.  Diabetes screening. This is done by checking your blood sugar (glucose) after you have not eaten for a while (fasting). You may have this done every 1-3 years.  Bone density scan. This is done to screen for osteoporosis. You may have this done starting at age 49.  Mammogram. This may be done every 1-2 years. Talk to your health care provider about how often you should have regular mammograms. Talk with your health care provider about your test results, treatment options, and if necessary, the need for more tests. Vaccines  Your health care provider may recommend certain vaccines, such as:  Influenza  vaccine. This is recommended every year.  Tetanus, diphtheria, and acellular pertussis (Tdap, Td) vaccine. You may need a Td booster every 10 years.  Zoster vaccine. You may need this after age 32.  Pneumococcal 13-valent conjugate (PCV13) vaccine. One dose is recommended  after age 67.  Pneumococcal polysaccharide (PPSV23) vaccine. One dose is recommended after age 31. Talk to your health care provider about which screenings and vaccines you need and how often you need them. This information is not intended to replace advice given to you by your health care provider. Make sure you discuss any questions you have with your health care provider. Document Released: 11/03/2015 Document Revised: 06/26/2016 Document Reviewed: 08/08/2015 Elsevier Interactive Patient Education  2017 [place] Prevention in the Home Falls can cause injuries. They can happen to people of all ages. There are many things you can do to make your home safe and to help prevent falls. What can I do on the outside of my home?  Regularly fix the edges of walkways and driveways and fix any cracks.  Remove anything that might make you trip as you walk through a door, such as a raised step or threshold.  Trim any bushes or trees on the path to your home.  Use bright outdoor lighting.  Clear any walking paths of anything that might make someone trip, such as rocks or tools.  Regularly check to see if handrails are loose or broken. Make sure that both sides of any steps have handrails.  Any raised decks and porches should have guardrails on the edges.  Have any leaves, snow, or ice cleared regularly.  Use sand or salt on walking paths during winter.  Clean up any spills in your garage right away. This includes oil or grease spills. What can I do in the bathroom?  Use night lights.  Install grab bars by the toilet and in the tub and shower. Do not use towel bars as grab bars.  Use non-skid mats or decals in the tub or shower.  If you need to sit down in the shower, use a plastic, non-slip stool.  Keep the floor dry. Clean up any water that spills on the floor as soon as it happens.  Remove soap buildup in the tub or shower regularly.  Attach bath mats securely with  double-sided non-slip rug tape.  Do not have throw rugs and other things on the floor that can make you trip. What can I do in the bedroom?  Use night lights.  Make sure that you have a light by your bed that is easy to reach.  Do not use any sheets or blankets that are too big for your bed. They should not hang down onto the floor.  Have a firm chair that has side arms. You can use this for support while you get dressed.  Do not have throw rugs and other things on the floor that can make you trip. What can I do in the kitchen?  Clean up any spills right away.  Avoid walking on wet floors.  Keep items that you use a lot in easy-to-reach places.  If you need to reach something above you, use a strong step stool that has a grab bar.  Keep electrical cords out of the way.  Do not use floor polish or wax that makes floors slippery. If you must use wax, use non-skid floor wax.  Do not have throw rugs and other things on the floor that can  make you trip. What can I do with my stairs?  Do not leave any items on the stairs.  Make sure that there are handrails on both sides of the stairs and use them. Fix handrails that are broken or loose. Make sure that handrails are as long as the stairways.  Check any carpeting to make sure that it is firmly attached to the stairs. Fix any carpet that is loose or worn.  Avoid having throw rugs at the top or bottom of the stairs. If you do have throw rugs, attach them to the floor with carpet tape.  Make sure that you have a light switch at the top of the stairs and the bottom of the stairs. If you do not have them, ask someone to add them for you. What else can I do to help prevent falls?  Wear shoes that:  Do not have high heels.  Have rubber bottoms.  Are comfortable and fit you well.  Are closed at the toe. Do not wear sandals.  If you use a stepladder:  Make sure that it is fully opened. Do not climb a closed stepladder.  Make  sure that both sides of the stepladder are locked into place.  Ask someone to hold it for you, if possible.  Clearly mark and make sure that you can see:  Any grab bars or handrails.  First and last steps.  Where the edge of each step is.  Use tools that help you move around (mobility aids) if they are needed. These include:  Canes.  Walkers.  Scooters.  Crutches.  Turn on the lights when you go into a dark area. Replace any light bulbs as soon as they burn out.  Set up your furniture so you have a clear path. Avoid moving your furniture around.  If any of your floors are uneven, fix them.  If there are any pets around you, be aware of where they are.  Review your medicines with your doctor. Some medicines can make you feel dizzy. This can increase your chance of falling. Ask your doctor what other things that you can do to help prevent falls. This information is not intended to replace advice given to you by your health care provider. Make sure you discuss any questions you have with your health care provider. Document Released: 08/03/2009 Document Revised: 03/14/2016 Document Reviewed: 11/11/2014 Elsevier Interactive Patient Education  2017 [employer].

## 2019-11-04 ENCOUNTER — Ambulatory Visit: Payer: Medicare HMO | Admitting: Nurse Practitioner

## 2019-11-05 ENCOUNTER — Encounter: Payer: Self-pay | Admitting: Nurse Practitioner

## 2019-11-06 ENCOUNTER — Encounter: Payer: Self-pay | Admitting: Nurse Practitioner

## 2019-11-16 ENCOUNTER — Ambulatory Visit: Payer: Self-pay | Admitting: Nurse Practitioner

## 2019-11-17 ENCOUNTER — Other Ambulatory Visit: Payer: Self-pay

## 2019-11-17 ENCOUNTER — Encounter: Payer: Self-pay | Admitting: Nurse Practitioner

## 2019-11-17 ENCOUNTER — Ambulatory Visit (INDEPENDENT_AMBULATORY_CARE_PROVIDER_SITE_OTHER): Payer: Medicare HMO | Admitting: Nurse Practitioner

## 2019-11-17 DIAGNOSIS — E119 Type 2 diabetes mellitus without complications: Secondary | ICD-10-CM | POA: Diagnosis not present

## 2019-11-17 DIAGNOSIS — E782 Mixed hyperlipidemia: Secondary | ICD-10-CM

## 2019-11-17 DIAGNOSIS — M1A9XX0 Chronic gout, unspecified, without tophus (tophi): Secondary | ICD-10-CM | POA: Diagnosis not present

## 2019-11-17 DIAGNOSIS — M8949 Other hypertrophic osteoarthropathy, multiple sites: Secondary | ICD-10-CM | POA: Diagnosis not present

## 2019-11-17 DIAGNOSIS — E559 Vitamin D deficiency, unspecified: Secondary | ICD-10-CM

## 2019-11-17 DIAGNOSIS — M159 Polyosteoarthritis, unspecified: Secondary | ICD-10-CM

## 2019-11-17 NOTE — Progress Notes (Signed)
This service is provided via telemedicine  No vital signs collected/recorded due to the encounter was a telemedicine visit.   Location of patient (ex: home, work):  Home    Patient consents to a telephone visit:  Yes  Location of the provider (ex: office, home):  [facility], Office   Name of any referring provider:  N/A  Names of all persons participating in the telemedicine service and their role in the encounter:  S.Chrae B/CMA, Sherrie Mustache, NP, and Patient   Time spent on call:  6 min with medical assistant      Careteam: Patient Care Team: Lauree Chandler, NP as PCP - General (Geriatric Medicine)  Advanced Directive information Does Patient Have a Medical Advance Directive?: Yes, Type of Advance Directive: [place], Does patient want to make changes to medical advance directive?: No - Patient declined  Allergies  Allergen Reactions  . Bactrim [Sulfamethoxazole-Trimethoprim] Diarrhea  . Clindamycin/Lincomycin Rash and Diarrhea    Terrible stomach cramps per patient  . Oxycodone Nausea And Vomiting and Nausea Only  . Penicillins Rash, Hives and Swelling    Chief Complaint  Patient presents with  . Medical Management of Chronic Issues    6 month follow-up. Telehealth     HPI: Patient is a 79 y.o. female via telephone visit for routine follow up.   Received her first COVID vaccine and went well- scheduled for second.   Vit D level low- started on supplement.   Hyperlipidemia- LDL elevated, suboptimally controlled based on cardiologist note but patient does not wish to change medication   Gout- allopurinol 300 mg daily, occasionally will feel a flare.   Hypertension- controlled on current regimen with medication and diet.   DM- blood sugar this morning 107, 3 months avg 114 and based on blood sugar readings A1c 5.66- when she plugged into a website.   Exercise by walking and doing housework  Has a hard time sleeping at  night. 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   Review of Systems:  Review of Systems  Constitutional: Negative for chills, fever and weight loss.  Respiratory: Negative for cough, sputum production and shortness of breath.   Cardiovascular: Negative for chest pain, palpitations and leg swelling.  Gastrointestinal: Negative for abdominal pain, constipation, diarrhea and heartburn.  Genitourinary: Negative for dysuria, frequency and urgency.  Musculoskeletal: Positive for joint pain (OA in knees and hands). Negative for back pain, falls and myalgias.  Skin: Negative.   Neurological: Negative for dizziness and headaches.  Psychiatric/Behavioral: Negative for depression and memory loss. The patient does not have insomnia.     Past Medical History:  Diagnosis Date  . Arthritis   . DM (diabetes mellitus) ([place])   . Hypercalcemia   . Hypercholesteremia   . Hypertension   . Rheumatoid arteritis ([place])    Past Surgical History:  Procedure Laterality Date  . bladder prolapse  2018   Dr. Ladean Raya  . carpal tunnel Bilateral 1998  . DILATION AND CURETTAGE OF UTERUS  1964   Dr. Leonia Reader  . MENISCUS REPAIR Right 2010   Dr. Ladean Raya  . PARTIAL HYSTERECTOMY  1973   ovaries remain  . Stroke  1994   TIA  . TONSILLECTOMY  1956   Social History:  reports that she has quit smoking. Her smoking use included cigarettes. She has never used smokeless tobacco. She reports current alcohol use of about 1.0 - 2.0 standard drinks of alcohol per week. She reports that she does not use drugs.  Family History  Problem Relation Age of Onset  . Hypertension Son   . Hypertension Son   . Hypertension Son     Medications: Patient's Medications  New Prescriptions   No medications on file  Previous Medications    ACCU-CHEK FASTCLIX LANCETS MISC    Check blood sugar once daily E11.9   ALLOPURINOL (ZYLOPRIM) 300 MG TABLET    TAKE 1 TABLET DAILY   AMLODIPINE (NORVASC) 5 MG TABLET    TAKE 1 TABLET DAILY   ASPIRIN EC 325 MG TABLET    Take 325 mg by mouth daily.   BIOTIN 5000 PO    Take 1 capsule by mouth daily.   FOLIC ACID (FOLVITE) 992 MCG TABLET    Take 800 mcg by mouth daily.    GLUCOSE BLOOD (ACCU-CHEK AVIVA PLUS) TEST STRIP    Check blood sugar once daily E11.9   HYDROCHLOROTHIAZIDE (HYDRODIURIL) 25 MG TABLET    TAKE 1 TABLET DAILY   LATANOPROST (XALATAN) 0.005 % OPHTHALMIC SOLUTION    Place 1 drop into both eyes at bedtime.   LOSARTAN (COZAAR) 100 MG TABLET    TAKE 1 TABLET BY MOUTH EVERY DAY   METFORMIN (GLUCOPHAGE) 500 MG TABLET    TAKE 1 TABLET DAILY   PRAVASTATIN (PRAVACHOL) 40 MG TABLET    TAKE 1 TABLET DAILY  Modified Medications   No medications on file  Discontinued Medications   No medications on file    Physical Exam:  There were no vitals filed for this visit. There is no height or weight on file to calculate BMI. Wt Readings from Last 3 Encounters:  04/28/19 168 lb (76.2 kg)  04/28/19 168 lb (76.2 kg)  02/12/19 167 lb (75.8 kg)    Weight 168 lbs 127/47  70 97.6    Labs reviewed: Basic Metabolic Panel: Recent Labs    12/23/18 0924 04/28/19 1114 05/12/19 1025  NA 142 137  --   K 3.9 3.8  --   CL 104 100  --   CO2 31 27  --   GLUCOSE 108* 114*  --   BUN 18 16  --   CREATININE 0.65 0.59*  --   CALCIUM 10.5*  10.5* 11.6* 10.6*  TSH 1.72  --   --    Liver Function Tests: Recent Labs    12/23/18 0924 04/28/19 1114  AST 16 19  ALT 14 17  BILITOT 0.6 1.1  PROT 6.4 6.9   No results for input(s): LIPASE, AMYLASE in the last 8760 hours. No results for input(s): AMMONIA in the last 8760 hours. CBC: Recent Labs    12/23/18 0924  WBC 6.3  NEUTROABS 3,427  HGB 13.6  HCT 39.3  MCV 94.5  PLT 304   Lipid Panel: Recent Labs    12/23/18 0924  04/28/19 1114  CHOL 160 200*  HDL 64 57  LDLCALC 78 107*  TRIG 99 237*  CHOLHDL 2.5 3.5   TSH: Recent Labs    12/23/18 0924  TSH 1.72   A1C: Lab Results  Component Value Date   HGBA1C 5.9 (H) 04/28/2019     Assessment/Plan 1. Chronic gout without tophus, unspecified cause, unspecified site -occasional flare in pain, continues on allopurinol 300 mg daily. - Uric Acid;  Future  2. Primary osteoarthritis involving multiple joints Stable, can use tylenol as needed pain. - CBC with Differential/Platelet; Future - CMP with eGFR([facility]); Future  3. Mixed hyperlipidemia Suboptimally controlled. Aware goal is <70 for cardiovascular prevention but would not like to change medication at this time. Encouraged dietary modifications as well. Continues on pravastatin 40 mg daily - Lipid Panel; Future  4. Type 2 diabetes mellitus without complication, without long-term current use of insulin ([place]) -reports good control with blood sugars. Continues on metformin 500 mg daily - Hemoglobin A1c; Future  5. Hypercalcemia -not on supplement, will follow - CMP with eGFR([facility]); Future  6. Vitamin D deficiency -continues on vit D 1000 units daily, will follow up level  - Vitamin D, 25-hydroxy; Future  Next appt: agreeable to come in march for blood work 6 months for routine follow up [employer]. Harle Battiest  [facility] [phone]    Virtual Visit via Telephone Note  I connected with pt on 11/17/19 at 10:30 AM EST by telephone and verified that I am speaking with the correct person using two identifiers.  Location: Patient: home Provider: office   I discussed the limitations, risks, security and privacy concerns of performing an evaluation and management service by telephone and the availability of in person appointments. I also discussed with the patient that there may be a patient responsible charge related to this service. The patient expressed  understanding and agreed to proceed.   I discussed the assessment and treatment plan with the patient. The patient was provided an opportunity to ask questions and all were answered. The patient agreed with the plan and demonstrated an understanding of the instructions.   The patient was advised to call back or seek an in-person evaluation if the symptoms worsen or if the condition fails to improve as anticipated.  I provided 18 minutes of non-face-to-face time during this encounter.  Carlos American. Harle Battiest Avs printed and mailed

## 2019-11-26 ENCOUNTER — Encounter: Payer: Self-pay | Admitting: Nurse Practitioner

## 2019-12-01 IMAGING — CR DG KNEE COMPLETE 4+V*R*
4 series · 4 of 4 positions shown · non-contrast
Comparison: None.

CLINICAL DATA: 76-year-old female with right knee pain for 2 weeks.
No trauma. Remote history of meniscal surgery. Initial encounter.

EXAM:
RIGHT KNEE - COMPLETE 4+ VIEW

[w knee ap right]
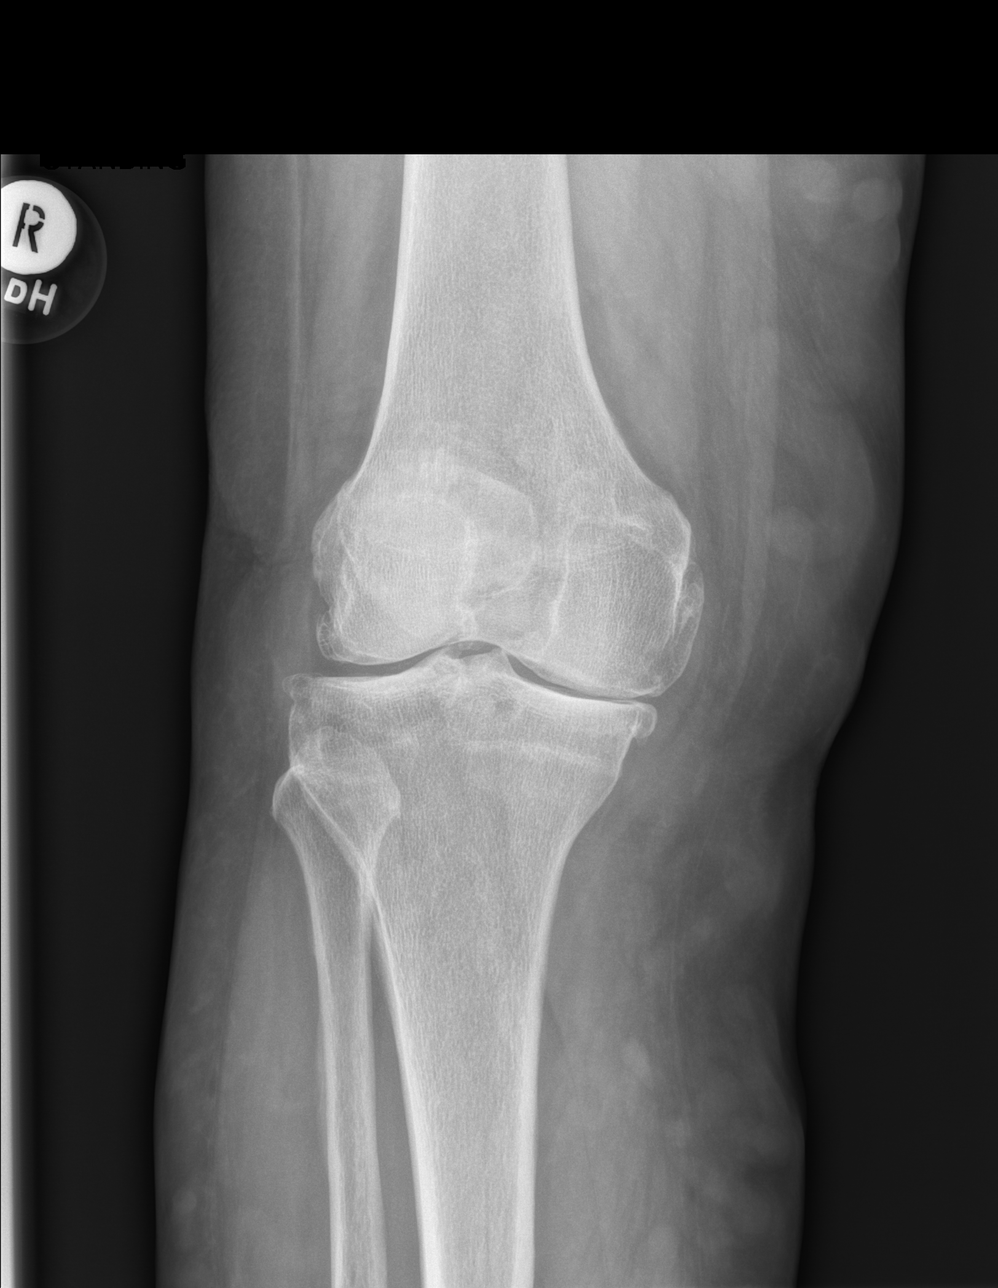

[w knee lat right]
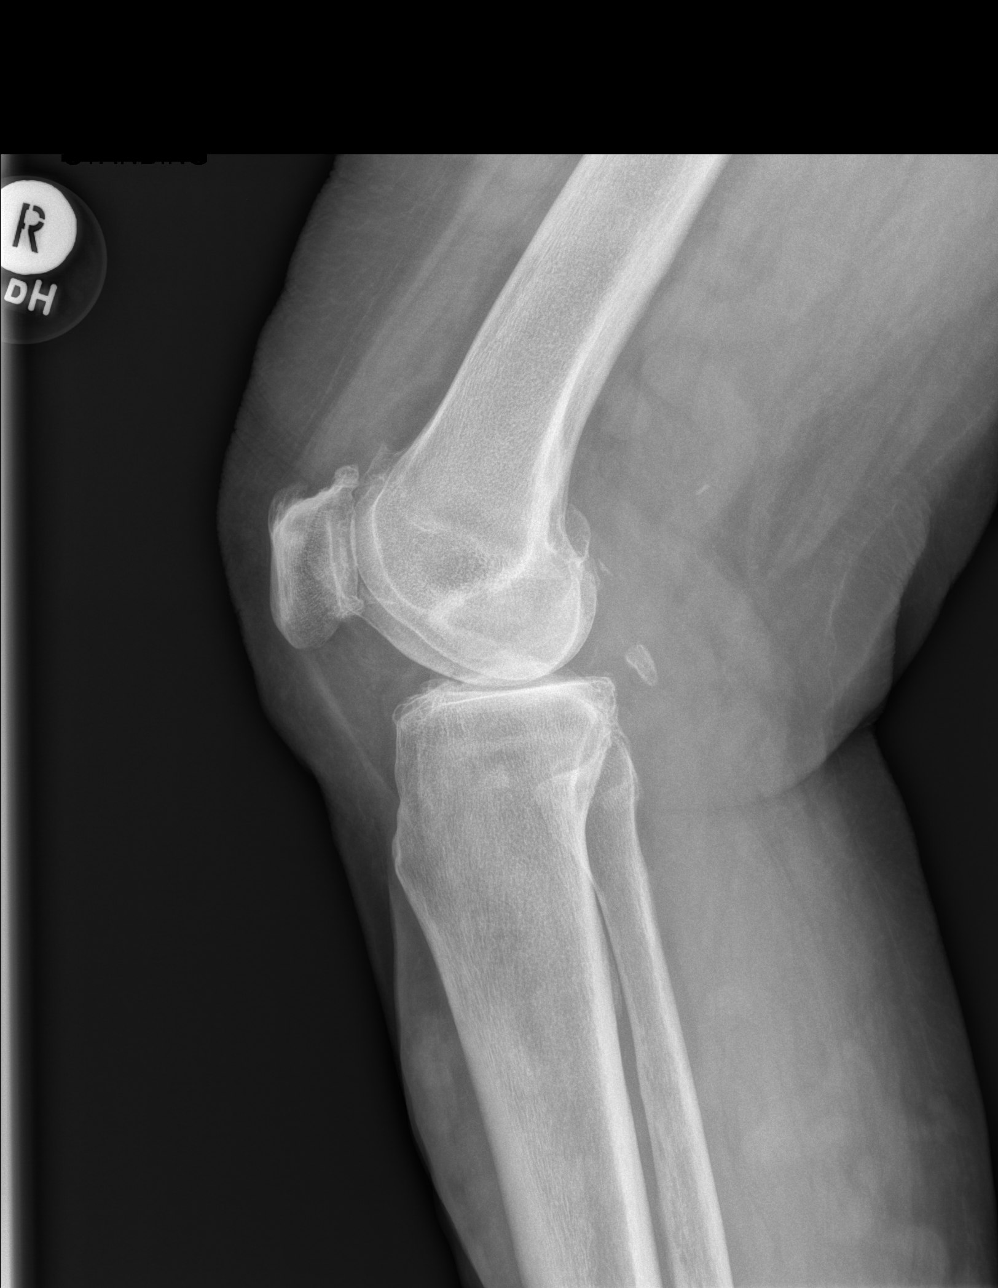

[x knee tunnel right]
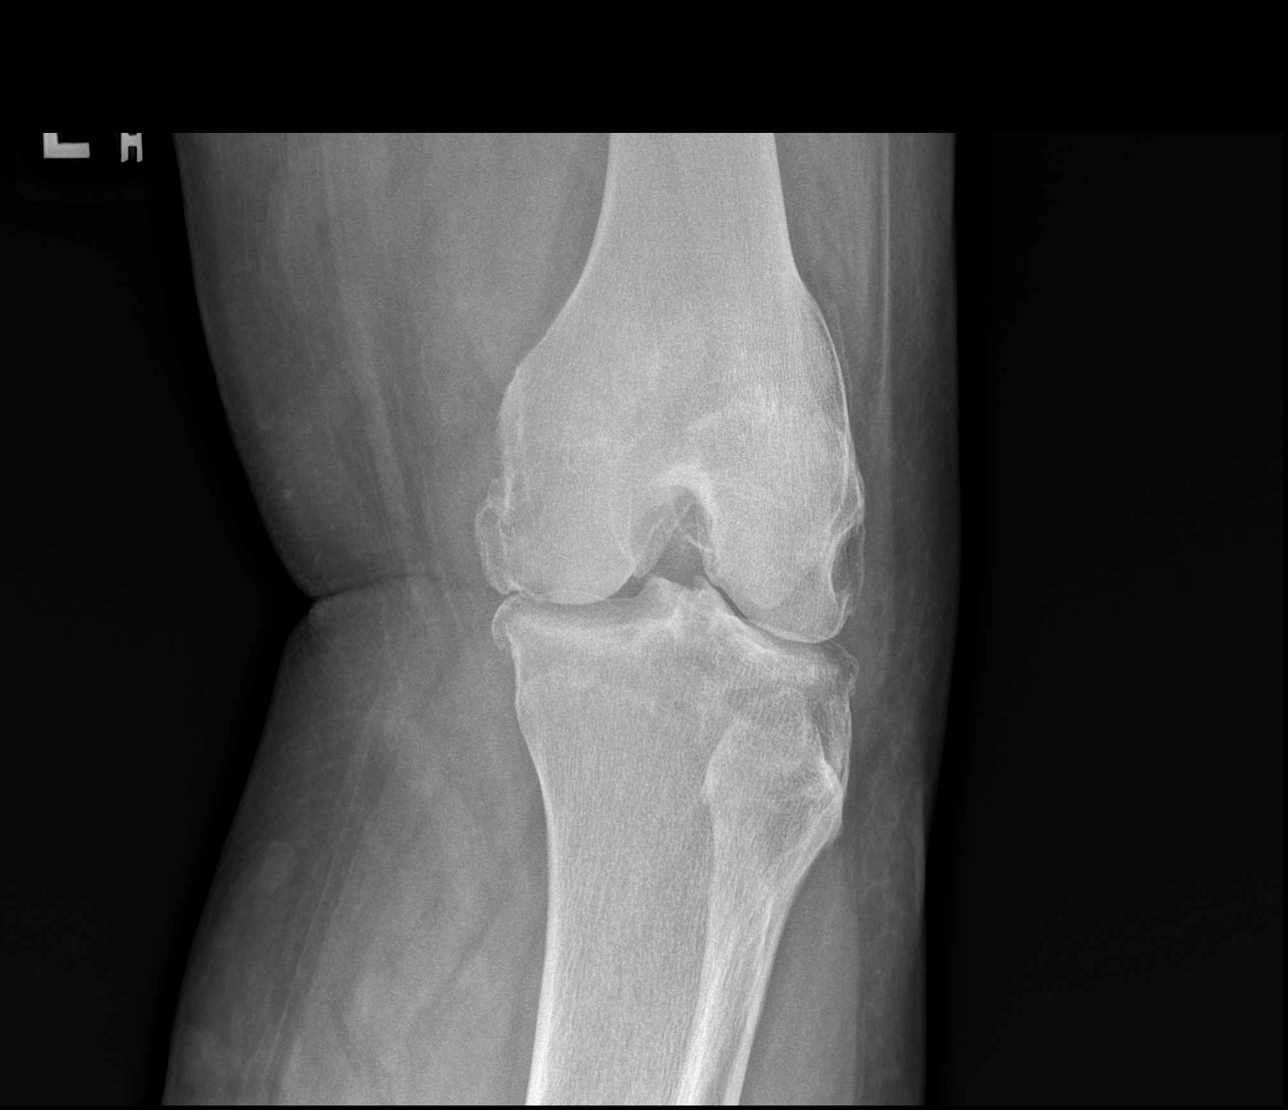

[x knee sunrise right]
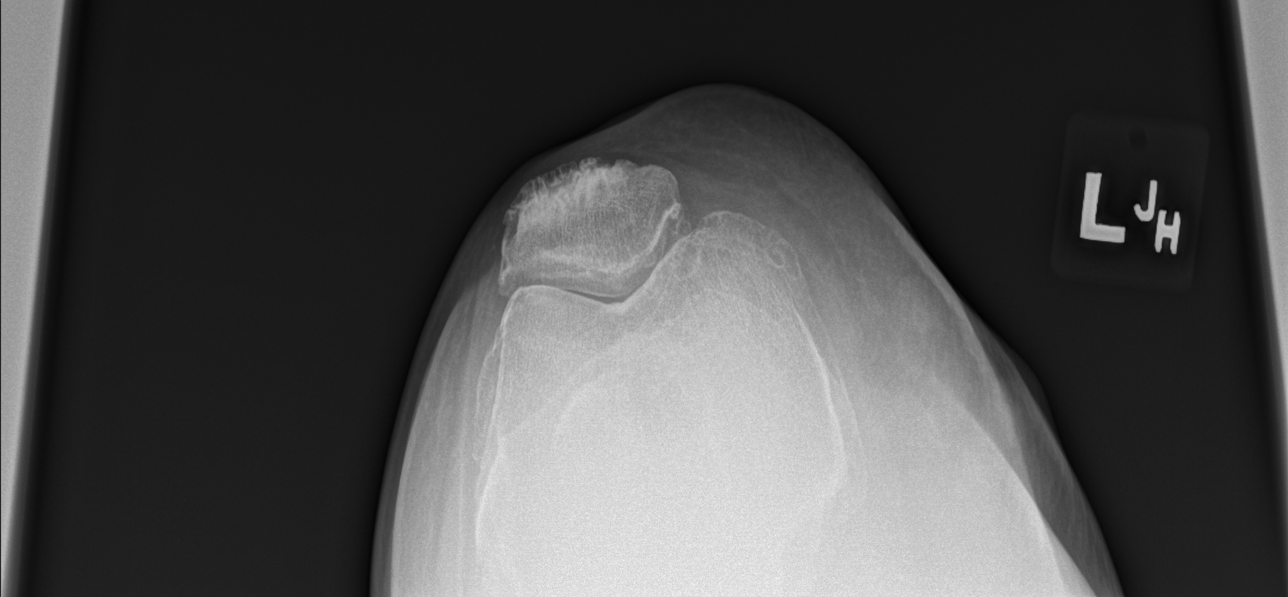

[4 of 4 positions shown; findings below may reference images not displayed]

FINDINGS: Moderate to marked tricompartment degenerative changes with findings
most notable involving the medial tibiofemoral joint space and
patellofemoral joint space.

Minimal suprapatellar joint fluid.

No fracture or dislocation.

Prominent varix greater medial aspect of the thigh and calf.
IMPRESSION: Moderate to marked tricompartment degenerative changes with findings
most notable involving the medial tibiofemoral joint space and
patellofemoral joint space.

Prominent varix greater medial aspect of the thigh and calf.

## 2019-12-07 ENCOUNTER — Other Ambulatory Visit: Payer: Self-pay | Admitting: Nurse Practitioner

## 2019-12-07 DIAGNOSIS — I10 Essential (primary) hypertension: Secondary | ICD-10-CM

## 2020-01-12 ENCOUNTER — Other Ambulatory Visit: Payer: Medicare HMO

## 2020-01-12 ENCOUNTER — Other Ambulatory Visit: Payer: Self-pay

## 2020-01-12 DIAGNOSIS — M159 Polyosteoarthritis, unspecified: Secondary | ICD-10-CM

## 2020-01-12 DIAGNOSIS — E559 Vitamin D deficiency, unspecified: Secondary | ICD-10-CM

## 2020-01-12 DIAGNOSIS — M1A9XX0 Chronic gout, unspecified, without tophus (tophi): Secondary | ICD-10-CM

## 2020-01-12 DIAGNOSIS — E119 Type 2 diabetes mellitus without complications: Secondary | ICD-10-CM

## 2020-01-12 DIAGNOSIS — M8949 Other hypertrophic osteoarthropathy, multiple sites: Secondary | ICD-10-CM

## 2020-01-12 DIAGNOSIS — E782 Mixed hyperlipidemia: Secondary | ICD-10-CM

## 2020-01-13 LAB — COMPLETE METABOLIC PANEL WITH GFR
AG Ratio: 2.2 (calc) (ref 1.0–2.5)
ALT: 18 U/L (ref 6–29)
AST: 19 U/L (ref 10–35)
Albumin: 4.8 g/dL (ref 3.6–5.1)
Alkaline phosphatase (APISO): 90 U/L (ref 37–153)
BUN: 18 mg/dL (ref 7–25)
CO2: 21 mmol/L (ref 20–32)
Calcium: 11.2 mg/dL — ABNORMAL HIGH (ref 8.6–10.4)
Chloride: 100 mmol/L (ref 98–110)
Creat: 0.69 mg/dL (ref 0.60–0.93)
GFR, Est African American: 97 mL/min/{1.73_m2} (ref >=60)
GFR, Est Non African American: 83 mL/min/{1.73_m2} (ref >=60)
Globulin: 2.2 g/dL (calc) (ref 1.9–3.7)
Glucose, Bld: 121 mg/dL — ABNORMAL HIGH (ref 65–99)
Potassium: 4.2 mmol/L (ref 3.5–5.3)
Sodium: 136 mmol/L (ref 135–146)
Total Bilirubin: 0.8 mg/dL (ref 0.2–1.2)
Total Protein: 7 g/dL (ref 6.1–8.1)

## 2020-01-13 LAB — CBC WITH DIFFERENTIAL/PLATELET
Absolute Monocytes: 574 cells/uL (ref 200–950)
Basophils Absolute: 49 cells/uL (ref 0–200)
Basophils Relative: 0.6 %
Eosinophils Absolute: 107 cells/uL (ref 15–500)
Eosinophils Relative: 1.3 %
HCT: 45.3 % — ABNORMAL HIGH (ref 35.0–45.0)
Hemoglobin: 15.1 g/dL (ref 11.7–15.5)
Lymphs Abs: 2837 cells/uL (ref 850–3900)
MCH: 31.6 pg (ref 27.0–33.0)
MCHC: 33.3 g/dL (ref 32.0–36.0)
MCV: 94.8 fL (ref 80.0–100.0)
MPV: 9.5 fL (ref 7.5–12.5)
Monocytes Relative: 7 %
Neutro Abs: 4633 cells/uL (ref 1500–7800)
Neutrophils Relative %: 56.5 %
Platelets: 274 10*3/uL (ref 140–400)
RBC: 4.78 10*6/uL (ref 3.80–5.10)
RDW: 12.5 % (ref 11.0–15.0)
Total Lymphocyte: 34.6 %
WBC: 8.2 10*3/uL (ref 3.8–10.8)

## 2020-01-13 LAB — LIPID PANEL
Cholesterol: 194 mg/dL (ref <200)
HDL: 60 mg/dL (ref >=50)
LDL Cholesterol (Calc): 105 mg/dL (calc) — ABNORMAL HIGH
Non-HDL Cholesterol (Calc): 134 mg/dL (calc) — ABNORMAL HIGH (ref <130)
Total CHOL/HDL Ratio: 3.2 (calc) (ref <5.0)
Triglycerides: 176 mg/dL — ABNORMAL HIGH (ref <150)

## 2020-01-13 LAB — URIC ACID: Uric Acid, Serum: 4.5 mg/dL (ref 2.5–7.0)

## 2020-01-13 LAB — HEMOGLOBIN A1C
Hgb A1c MFr Bld: 5.9 % of total Hgb — ABNORMAL HIGH (ref <5.7)
Mean Plasma Glucose: 123 (calc)
eAG (mmol/L): 6.8 (calc)

## 2020-01-13 LAB — VITAMIN D 25 HYDROXY (VIT D DEFICIENCY, FRACTURES): Vit D, 25-Hydroxy: 38 ng/mL (ref 30–100)

## 2020-02-24 ENCOUNTER — Other Ambulatory Visit: Payer: Self-pay | Admitting: *Deleted

## 2020-02-24 MED ORDER — ONETOUCH VERIO VI STRP: ORAL_STRIP | 3 refills | Status: DC

## 2020-02-24 MED ORDER — ONETOUCH DELICA LANCETS 33G MISC: 3 refills | Status: DC

## 2020-02-24 NOTE — Telephone Encounter (Signed)
[facility]

## 2020-02-28 ENCOUNTER — Other Ambulatory Visit: Payer: Self-pay | Admitting: Nurse Practitioner

## 2020-02-28 DIAGNOSIS — I10 Essential (primary) hypertension: Secondary | ICD-10-CM

## 2020-03-14 ENCOUNTER — Other Ambulatory Visit: Payer: Self-pay | Admitting: Nurse Practitioner

## 2020-03-19 ENCOUNTER — Other Ambulatory Visit: Payer: Self-pay | Admitting: Nurse Practitioner

## 2020-03-19 DIAGNOSIS — E1169 Type 2 diabetes mellitus with other specified complication: Secondary | ICD-10-CM

## 2020-03-20 ENCOUNTER — Encounter: Payer: Self-pay | Admitting: Nurse Practitioner

## 2020-04-10 ENCOUNTER — Other Ambulatory Visit: Payer: Self-pay | Admitting: Nurse Practitioner

## 2020-04-30 ENCOUNTER — Other Ambulatory Visit: Payer: Self-pay | Admitting: Nurse Practitioner

## 2020-05-15 ENCOUNTER — Other Ambulatory Visit: Payer: Self-pay

## 2020-05-15 ENCOUNTER — Encounter: Payer: Self-pay | Admitting: Nurse Practitioner

## 2020-05-15 ENCOUNTER — Ambulatory Visit (INDEPENDENT_AMBULATORY_CARE_PROVIDER_SITE_OTHER): Payer: Medicare HMO | Admitting: Nurse Practitioner

## 2020-05-15 VITALS — BP 152/70 | HR 100 | Temp 96.9°F | Ht 61.5 in | Wt 167.0 lb

## 2020-05-15 DIAGNOSIS — E782 Mixed hyperlipidemia: Secondary | ICD-10-CM

## 2020-05-15 DIAGNOSIS — M159 Polyosteoarthritis, unspecified: Secondary | ICD-10-CM

## 2020-05-15 DIAGNOSIS — M1A9XX0 Chronic gout, unspecified, without tophus (tophi): Secondary | ICD-10-CM | POA: Diagnosis not present

## 2020-05-15 DIAGNOSIS — E119 Type 2 diabetes mellitus without complications: Secondary | ICD-10-CM

## 2020-05-15 DIAGNOSIS — E559 Vitamin D deficiency, unspecified: Secondary | ICD-10-CM

## 2020-05-15 DIAGNOSIS — M8949 Other hypertrophic osteoarthropathy, multiple sites: Secondary | ICD-10-CM

## 2020-05-15 DIAGNOSIS — I10 Essential (primary) hypertension: Secondary | ICD-10-CM

## 2020-05-15 NOTE — Progress Notes (Signed)
Careteam: Patient Care Team: Sharon Seller, NP as PCP - General (Geriatric Medicine)  PLACE OF SERVICE:  [facility]  Advanced Directive information Does Patient Have a Medical Advance Directive?: Yes, Type of Advance Directive: Healthcare Power of Attorney, Does patient want to make changes to medical advance directive?: No - Patient declined  Allergies  Allergen Reactions   Bactrim [Sulfamethoxazole-Trimethoprim] Diarrhea   Clindamycin/Lincomycin Rash and Diarrhea    Terrible stomach cramps per patient   Oxycodone Nausea And Vomiting and Nausea Only   Penicillins Rash, Hives and Swelling    Chief Complaint  Patient presents with   Medical Management of Chronic Issues    6 month follow-up    Quality Metric Gaps    Discuss need for eye exam. Foot exam today    Best Practice Recommendations    Discuss need for Hep C screening    Immunizations    Declined TD/TDap Recommendation      HPI: Patient is a 79 y.o. female for routine follow up.   Cancelled cataract surgery in January, last follow up was Dec 2020, Dr Rodman Pickle ophthalmology  htn- [place] hypertension- blood pressure at goal at home. 127/74 this morning before she came in with HR 77.  138/78 on recheck.   Gout- no recent flare, on allopuroinol 300 mg daily  DM- 109 blood sugar this morning. Continues on metformin 500 mg daily, attempts diet modifications.   Hyperlipidemia- continues on pravastatin for cholesterol.  Review of Systems:  Review of Systems  Constitutional: Negative for chills, fever and weight loss.  HENT: Negative for tinnitus.   Respiratory: Negative for cough, sputum production and shortness of breath.   Cardiovascular: Negative for chest pain, palpitations and leg swelling.  Gastrointestinal: Negative for abdominal pain, constipation, diarrhea and heartburn.  Genitourinary: Negative for dysuria, frequency and urgency.  Musculoskeletal: Negative for back pain, falls,  joint pain and myalgias.  Skin: Negative.   Neurological: Negative for dizziness and headaches.  Psychiatric/Behavioral: Negative for depression and memory loss. The patient has insomnia (due to increase in urination.).     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 Cranston Neighbor   carpal tunnel Bilateral 1998   DILATION AND CURETTAGE OF UTERUS  1964   Dr. Alease Medina   MENISCUS REPAIR Right 2010   Dr. Cranston Neighbor   PARTIAL HYSTERECTOMY  1973   ovaries remain   Stroke  1994   TIA   TONSILLECTOMY  1956   Social History:   reports that she has quit smoking. Her smoking use included cigarettes. She has never used smokeless tobacco. She reports current alcohol use of about 1.0 - 2.0 standard drink of alcohol per week. She reports that she does not use drugs.  Family History  Problem Relation Age of Onset   Hypertension Son    Hypertension Son    Hypertension Son     Medications: Patient's Medications  New Prescriptions   No medications on file  Previous Medications   ALLOPURINOL (ZYLOPRIM) 300 MG TABLET    TAKE 1 TABLET DAILY   AMLODIPINE (NORVASC) 5 MG TABLET    TAKE 1 TABLET DAILY   ASPIRIN EC 325 MG TABLET    Take 325 mg by  mouth daily.   BIOTIN 5000 PO    Take 1 capsule by mouth daily.   FOLIC ACID (FOLVITE) 400 MCG TABLET    Take 800 mcg by mouth daily.    GLUCOSE BLOOD (ONETOUCH VERIO) TEST STRIP    Use to test blood sugar once daily. Dx: E11.9   HYDROCHLOROTHIAZIDE (HYDRODIURIL) 25 MG TABLET    TAKE 1 TABLET DAILY   LATANOPROST (XALATAN) 0.005 % OPHTHALMIC SOLUTION    Place 1 drop into both eyes at bedtime.   LOSARTAN (COZAAR) 100 MG TABLET    TAKE 1 TABLET BY MOUTH EVERY DAY   METFORMIN (GLUCOPHAGE) 500 MG TABLET    TAKE 1 TABLET  DAILY   ONETOUCH DELICA LANCETS 33G MISC    Use to test blood sugar once daily. Dx: E11.9   PRAVASTATIN (PRAVACHOL) 40 MG TABLET    TAKE 1 TABLET DAILY  Modified Medications   No medications on file  Discontinued Medications   ACCU-CHEK AVIVA PLUS TEST STRIP    CHECK BLOOD SUGAR ONCE DAILY E11.9   ACCU-CHEK FASTCLIX LANCETS MISC    Check blood sugar once daily E11.9    Physical Exam:  Vitals:   05/15/20 0953  BP: (!) 152/70  Pulse: 100  Temp: (!) 96.9 F (36.1 C)  TempSrc: Temporal  SpO2: 98%  Weight: 167 lb (75.8 kg)  Height: 5' 1.5" (1.562 m)   Body mass index is 31.04 kg/m. Wt Readings from Last 3 Encounters:  05/15/20 167 lb (75.8 kg)  04/28/19 168 lb (76.2 kg)  04/28/19 168 lb (76.2 kg)    Physical Exam Constitutional:      General: She is not in acute distress.    Appearance: She is well-developed. She is not diaphoretic.  HENT:     Head: Normocephalic and atraumatic.     Mouth/Throat:     Pharynx: No oropharyngeal exudate.  Eyes:     Conjunctiva/sclera: Conjunctivae normal.     Pupils: Pupils are equal, round, and reactive to light.  Cardiovascular:     Rate and Rhythm: Normal rate and regular rhythm.     Heart sounds: Normal heart sounds.  Pulmonary:     Effort: Pulmonary effort is normal.     Breath sounds: Normal breath sounds.  Abdominal:     General: Bowel sounds are normal.     Palpations: Abdomen is soft.  Musculoskeletal:        General: No tenderness.     Cervical back: Normal range of motion and neck supple.  Skin:    General: Skin is warm and dry.  Neurological:     Mental Status: She is alert and oriented to person, place, and time.     Labs reviewed: Basic Metabolic Panel: Recent Labs    01/12/20 0958  NA 136  K 4.2  CL 100  CO2 21  GLUCOSE 121*  BUN 18  CREATININE 0.69  CALCIUM 11.2*   Liver Function Tests: Recent Labs    01/12/20 0958  AST 19  ALT 18  BILITOT 0.8  PROT 7.0   No results for input(s): LIPASE,  AMYLASE in the last 8760 hours. No results for input(s): AMMONIA in the last 8760 hours. CBC: Recent Labs    01/12/20 0958  WBC 8.2  NEUTROABS 4,633  HGB 15.1  HCT 45.3*  MCV 94.8  PLT 274   Lipid Panel: Recent Labs    01/12/20 0958  CHOL 194  HDL 60  LDLCALC 105*  TRIG 176*  CHOLHDL 3.2  TSH: No results for input(s): TSH in the last 8760 hours. A1C: Lab Results  Component Value Date   HGBA1C 5.9 (H) 01/12/2020     Assessment/Plan 1. Primary osteoarthritis involving multiple joints Ongoing, stable at this time.  2. Mixed hyperlipidemia -continues on lipitor. Will follow up labs - Lipid Panel - COMPLETE METABOLIC PANEL WITH GFR  3. Chronic gout without tophus, unspecified cause, unspecified site No recent flares, continues on allopurinol 300 mg daily   4. Type 2 diabetes mellitus without complication, without long-term current use of insulin ([facility]) -continue on dietary modifications and with metformin daily. Encouraged dietary compliance, routine foot care/monitoring and to keep up with diabetic eye exams through ophthalmology  - CBC with Differential/Platelet - Hemoglobin A1c  5. Vitamin D deficiency - Vitamin D, 25-hydroxy  6. Hypercalcemia -not on supplement, asymptomatic at this time - COMPLETE METABOLIC PANEL WITH GFR  7. Essential hypertension with goal blood pressure less than 140/90 -encouraged ongoing home monitoring. Continue low sodium diet with losartan, hctz and norvasc  - COMPLETE METABOLIC PANEL WITH GFR - CBC with Differential/Platelet  Next appt: 6 months. Labs at visit Shakedra Beam K. Biagio Borg  [facility] [phone]

## 2020-05-16 LAB — LIPID PANEL
Cholesterol: 182 mg/dL (ref <200)
HDL: 60 mg/dL (ref >=50)
LDL Cholesterol (Calc): 94 mg/dL (calc)
Non-HDL Cholesterol (Calc): 122 mg/dL (calc) (ref <130)
Total CHOL/HDL Ratio: 3 (calc) (ref <5.0)
Triglycerides: 179 mg/dL — ABNORMAL HIGH (ref <150)

## 2020-05-16 LAB — COMPLETE METABOLIC PANEL WITH GFR
AG Ratio: 2.2 (calc) (ref 1.0–2.5)
ALT: 15 U/L (ref 6–29)
AST: 19 U/L (ref 10–35)
Albumin: 4.9 g/dL (ref 3.6–5.1)
Alkaline phosphatase (APISO): 89 U/L (ref 37–153)
BUN: 17 mg/dL (ref 7–25)
CO2: 25 mmol/L (ref 20–32)
Calcium: 11.6 mg/dL — ABNORMAL HIGH (ref 8.6–10.4)
Chloride: 100 mmol/L (ref 98–110)
Creat: 0.74 mg/dL (ref 0.60–0.93)
GFR, Est African American: 89 mL/min/{1.73_m2} (ref >=60)
GFR, Est Non African American: 77 mL/min/{1.73_m2} (ref >=60)
Globulin: 2.2 g/dL (calc) (ref 1.9–3.7)
Glucose, Bld: 118 mg/dL — ABNORMAL HIGH (ref 65–99)
Potassium: 4.3 mmol/L (ref 3.5–5.3)
Sodium: 141 mmol/L (ref 135–146)
Total Bilirubin: 1.2 mg/dL (ref 0.2–1.2)
Total Protein: 7.1 g/dL (ref 6.1–8.1)

## 2020-05-16 LAB — CBC WITH DIFFERENTIAL/PLATELET
Absolute Monocytes: 393 cells/uL (ref 200–950)
Basophils Absolute: 62 cells/uL (ref 0–200)
Basophils Relative: 0.9 %
Eosinophils Absolute: 62 cells/uL (ref 15–500)
Eosinophils Relative: 0.9 %
HCT: 44.4 % (ref 35.0–45.0)
Hemoglobin: 14.7 g/dL (ref 11.7–15.5)
Lymphs Abs: 2236 cells/uL (ref 850–3900)
MCH: 31.3 pg (ref 27.0–33.0)
MCHC: 33.1 g/dL (ref 32.0–36.0)
MCV: 94.7 fL (ref 80.0–100.0)
MPV: 9.1 fL (ref 7.5–12.5)
Monocytes Relative: 5.7 %
Neutro Abs: 4147 cells/uL (ref 1500–7800)
Neutrophils Relative %: 60.1 %
Platelets: 314 10*3/uL (ref 140–400)
RBC: 4.69 10*6/uL (ref 3.80–5.10)
RDW: 12.6 % (ref 11.0–15.0)
Total Lymphocyte: 32.4 %
WBC: 6.9 10*3/uL (ref 3.8–10.8)

## 2020-05-16 LAB — HEMOGLOBIN A1C
Hgb A1c MFr Bld: 5.9 % of total Hgb — ABNORMAL HIGH (ref <5.7)
Mean Plasma Glucose: 123 (calc)
eAG (mmol/L): 6.8 (calc)

## 2020-05-16 LAB — VITAMIN D 25 HYDROXY (VIT D DEFICIENCY, FRACTURES): Vit D, 25-Hydroxy: 36 ng/mL (ref 30–100)

## 2020-05-18 ENCOUNTER — Telehealth: Payer: Self-pay

## 2020-05-18 MED ORDER — ATORVASTATIN CALCIUM 40 MG PO TABS: 40.0000 mg | ORAL_TABLET | Freq: Every day | ORAL | 1 refills | Status: DC

## 2020-05-18 NOTE — Telephone Encounter (Signed)
Discussed results with patient, patient verbalized understanding of results  Patient had also viewed labs online.   Patient requested rx be sent to [facility]. RX sent

## 2020-05-18 NOTE — Telephone Encounter (Signed)
-----   Message from Sharon Seller, NP sent at 05/17/2020  8:49 PM EDT ----- Cholesterol is not at goal. Since she has diabetes LDL should be less than 70 and at 94; would recommend changing her cholesterol medication to lipitor 40 at this time which should get her to goal. Glucose elevated on labs but A1c at goal. Calcium remains elevated but stable. Vit D on the low end of normal but stable.

## 2020-06-10 ENCOUNTER — Other Ambulatory Visit: Payer: Self-pay | Admitting: Nurse Practitioner

## 2020-06-10 DIAGNOSIS — I10 Essential (primary) hypertension: Secondary | ICD-10-CM

## 2020-07-10 ENCOUNTER — Ambulatory Visit (INDEPENDENT_AMBULATORY_CARE_PROVIDER_SITE_OTHER): Payer: Medicare HMO

## 2020-07-10 ENCOUNTER — Other Ambulatory Visit: Payer: Self-pay

## 2020-07-10 ENCOUNTER — Encounter: Payer: Self-pay | Admitting: Nurse Practitioner

## 2020-07-10 DIAGNOSIS — M159 Polyosteoarthritis, unspecified: Secondary | ICD-10-CM

## 2020-07-10 DIAGNOSIS — Z23 Encounter for immunization: Secondary | ICD-10-CM

## 2020-07-10 DIAGNOSIS — M8949 Other hypertrophic osteoarthropathy, multiple sites: Secondary | ICD-10-CM

## 2020-07-10 DIAGNOSIS — M1711 Unilateral primary osteoarthritis, right knee: Secondary | ICD-10-CM

## 2020-07-24 ENCOUNTER — Ambulatory Visit (INDEPENDENT_AMBULATORY_CARE_PROVIDER_SITE_OTHER): Payer: Medicare HMO | Admitting: Orthopaedic Surgery

## 2020-07-24 ENCOUNTER — Telehealth: Payer: Self-pay

## 2020-07-24 ENCOUNTER — Ambulatory Visit: Payer: Self-pay

## 2020-07-24 ENCOUNTER — Encounter: Payer: Self-pay | Admitting: Orthopaedic Surgery

## 2020-07-24 DIAGNOSIS — G8929 Other chronic pain: Secondary | ICD-10-CM

## 2020-07-24 DIAGNOSIS — M25561 Pain in right knee: Secondary | ICD-10-CM | POA: Diagnosis not present

## 2020-07-24 MED ORDER — LIDOCAINE HCL 1 % IJ SOLN
3.0000 mL | INTRAMUSCULAR | Status: AC | PRN
  Administered 2020-07-24: 3 mL

## 2020-07-24 MED ORDER — METHYLPREDNISOLONE ACETATE 40 MG/ML IJ SUSP
40.0000 mg | INTRAMUSCULAR | Status: AC | PRN
  Administered 2020-07-24: 40 mg via INTRA_ARTICULAR

## 2020-07-24 NOTE — Telephone Encounter (Signed)
Right knee gel injection  

## 2020-07-24 NOTE — Progress Notes (Signed)
Office Visit Note   Patient: Debra Madden           Date of Birth: October 12, 1941           MRN: [id number] Visit Date: 07/24/2020              Requested by: Sharon Seller, NP [address] [place],  [place] [address] PCP: Sharon Seller, NP   Assessment & Plan: Visit Diagnoses:  1. Chronic pain of right knee     Plan: The patient understands that she does have significant arthritis in her right knee.  She would like to try just conservative treatment for now so I did place a steroid injection in her right knee.  She is appropriate candidate for hyaluronic acid given the amount of arthritis in her knee.  We will order hyaluronic acid for that knee.  At some point she may end up needing a knee replacement but she would like to continue to pursue conservative treatment.  I did explain the risk and benefits of steroid injections in her knee and she did tolerate it well.  Follow-Up Instructions: Return in about 4 weeks (around 08/21/2020).   Orders:  Orders Placed This Encounter  Procedures  . Large Joint Inj  . XR Knee 1-2 Views Right   No orders of the defined types were placed in this encounter.     Procedures: Large Joint Inj: R knee on 07/24/2020 3:02 PM Indications: diagnostic evaluation and pain Details: 22 G 1.5 in needle, superolateral approach  Arthrogram: No  Medications: 3 mL lidocaine 1 %; 40 mg methylPREDNISolone acetate 40 MG/ML Outcome: tolerated well, no immediate complications Procedure, treatment alternatives, risks and benefits explained, specific risks discussed. Consent was given by the patient. Immediately prior to procedure a time out was called to verify the correct patient, procedure, equipment, support staff and site/side marked as required. Patient was prepped and draped in the usual sterile fashion.       Clinical Data: No additional findings.   Subjective: Chief Complaint  Patient presents with  . Right Knee - Pain  The patient  comes in today for evaluation treatment of right knee pain is been hurting off and on for about 5 years now.  She reports that 11 years ago she had a right knee arthroscopy with a partial medial meniscectomy.  She points to mainly the medial joint line of that knee as a source for pain.  She is a very active 79 year old female.  She denies any other acute medical issues.  She denies any left knee pain at all.  Her knee hurts more with weightbearing types of activities.  She is a diabetic but reports good control.  HPI  Review of Systems She currently denies any headache, chest pain, shortness of breath, fever, chills, nausea, vomiting  Objective: Vital Signs: There were no vitals taken for this visit.  Physical Exam She is alert and orient x3 and in no acute distress Ortho Exam Examination of her right knee shows varus malalignment.  There is good range of motion the knee but significant patellofemoral crepitation and significant medial joint line tenderness as well. Specialty Comments:  No specialty comments available.  Imaging: XR Knee 1-2 Views Right  Result Date: 07/24/2020 An AP and lateral of the right knee shows tricompartmental arthritic changes.  There is varus malalignment and significant narrowing of the medial joint space.  There are osteophytes in all 3 compartments.    PMFS History: Patient Active  Problem List   Diagnosis Date Noted  . Chronic gout without tophus 03/17/2018  . Primary osteoarthritis involving multiple joints 03/17/2018  . High risk medication use 03/17/2018  . Mixed hyperlipidemia 03/17/2018  . SUI (stress urinary incontinence, female) 08/01/2017  . 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/31/2017  . Left flank pain 10/09/2015  . Type 2 diabetes mellitus without complication, without long-term current use of insulin ([facility]) 10/09/2015  . Essential hypertension with goal blood pressure less than 140/90 04/06/2015  . Pure hypercholesterolemia  10/05/2014  . Hypercalcemia 09/21/2013  . ETD (eustachian tube dysfunction) 01/24/2012  . Right carotid bruit 04/04/2011   Past Medical History:  Diagnosis Date  . Arthritis   . DM (diabetes mellitus) ([facility])   . Hypercalcemia   . Hypercholesteremia   . Hypertension   . 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 Hypertension Son   . Hypertension Son   . Hypertension Son     Past Surgical History:  Procedure Laterality Date  . bladder prolapse  2018   Dr. Cranston Neighbor  . carpal tunnel Bilateral 1998  . DILATION AND CURETTAGE OF UTERUS  1964   Dr. Alease Medina  . MENISCUS REPAIR Right 2010   Dr. Cranston Neighbor  . PARTIAL HYSTERECTOMY  1973   ovaries remain  . Stroke  1994   TIA  . TONSILLECTOMY  1956   Social History   Occupational History  . Not on file  Tobacco Use  . Smoking status: Former Smoker    Types: Cigarettes  . Smokeless tobacco: Never Used  . Tobacco comment: Quit at age 59  Vaping Use  . Vaping Use: Never used  Substance and Sexual Activity  . Alcohol use: Yes    Alcohol/week: 1.0 - 2.0 standard drink    Types: 1 - 2 Glasses of wine per week  . Drug use: Never  . Sexual activity: Not Currently

## 2020-07-25 NOTE — Telephone Encounter (Signed)
Noted  

## 2020-07-26 NOTE — Telephone Encounter (Signed)
Submitted for VOB for Gel one- Right knee 

## 2020-07-27 ENCOUNTER — Telehealth: Payer: Self-pay

## 2020-07-27 NOTE — Telephone Encounter (Signed)
Pt's insurance requires Prior auth for gel injections with [employer]

## 2020-07-27 NOTE — Telephone Encounter (Signed)
Paperwork completed and faxed to insurance company.

## 2020-08-04 ENCOUNTER — Telehealth: Payer: Self-pay

## 2020-08-04 NOTE — Telephone Encounter (Signed)
Recevied authorization Auth # [id number] Dates: 08/03/20-11/03/20

## 2020-08-04 NOTE — Telephone Encounter (Signed)
Approved for Gel-One-Right knee Dr. Kathrin Greathouse and Bill No Copay No OOP Auth # [id number] Dates: 08/03/20-11/03/20

## 2020-08-06 ENCOUNTER — Other Ambulatory Visit: Payer: Self-pay | Admitting: Nurse Practitioner

## 2020-08-06 DIAGNOSIS — I10 Essential (primary) hypertension: Secondary | ICD-10-CM

## 2020-08-21 ENCOUNTER — Ambulatory Visit (INDEPENDENT_AMBULATORY_CARE_PROVIDER_SITE_OTHER): Payer: Medicare HMO | Admitting: Orthopaedic Surgery

## 2020-08-21 ENCOUNTER — Encounter: Payer: Self-pay | Admitting: Orthopaedic Surgery

## 2020-08-21 DIAGNOSIS — M1711 Unilateral primary osteoarthritis, right knee: Secondary | ICD-10-CM

## 2020-08-21 DIAGNOSIS — M25561 Pain in right knee: Secondary | ICD-10-CM

## 2020-08-21 DIAGNOSIS — G8929 Other chronic pain: Secondary | ICD-10-CM

## 2020-08-21 MED ORDER — CROSS-LINK HYAL ACID (VISC) 30 MG/3ML IX PRSY
30.0000 mg | PREFILLED_SYRINGE | INTRA_ARTICULAR | Status: AC | PRN
  Administered 2020-08-21: 30 mg via INTRA_ARTICULAR

## 2020-08-21 NOTE — Progress Notes (Signed)
   Procedure Note  Patient: Debra Madden             Date of Birth: 08-25-1941           MRN: [id number]             Visit Date: 08/21/2020  Procedures: Visit Diagnoses:  1. Chronic pain of right knee   2. Unilateral primary osteoarthritis, right knee     Large Joint Inj: R knee on 08/21/2020 1:06 PM Indications: diagnostic evaluation and pain Details: 22 G 1.5 in needle, superolateral approach  Arthrogram: No  Medications: 30 mg Cross-Linked Hyaluronate 30 MG/3ML Outcome: tolerated well, no immediate complications Procedure, treatment alternatives, risks and benefits explained, specific risks discussed. Consent was given by the patient. Immediately prior to procedure a time out was called to verify the correct patient, procedure, equipment, support staff and site/side marked as required. Patient was prepped and draped in the usual sterile fashion.     The patient comes in today for scheduled hyaluronic acid injection with gel 1.  This is to treat osteoarthritis in her right knee.  She has had a mechanical fall recently and the knee does seem a little more swollen to her.  She understands why she is getting this injection.  I did try to aspirate fluid from the knee but there was no fluid aspirated.  She did tolerate the gel 1 injection well.  All questions and concerns were answered and addressed.  She understands her neck step would be a steroid injection if this does not help and she should always consider knee replacement surgery if pain gets bad enough and the conservative treatment fails.

## 2020-08-25 ENCOUNTER — Other Ambulatory Visit: Payer: Self-pay | Admitting: Nurse Practitioner

## 2020-08-29 ENCOUNTER — Telehealth: Payer: Self-pay

## 2020-08-29 LAB — HM DIABETES EYE EXAM

## 2020-08-29 NOTE — Telephone Encounter (Signed)
Incoming fax received from [employer] with [employer] indicating patient is due for a diabetic eye exam.  Per Sharon Seller, NP, please notify patient she needs a diabetic eye exam.  Spoke with patient and she has an eye exam scheduled for today with [facility]. Patient aware to have eye doctor send report to our office.

## 2020-09-01 ENCOUNTER — Encounter: Payer: Self-pay | Admitting: Nurse Practitioner

## 2020-09-29 ENCOUNTER — Encounter: Payer: Medicare HMO | Admitting: Nurse Practitioner

## 2020-10-03 ENCOUNTER — Other Ambulatory Visit: Payer: Self-pay

## 2020-10-03 ENCOUNTER — Ambulatory Visit (INDEPENDENT_AMBULATORY_CARE_PROVIDER_SITE_OTHER): Payer: Medicare HMO | Admitting: Nurse Practitioner

## 2020-10-03 ENCOUNTER — Telehealth: Payer: Self-pay

## 2020-10-03 ENCOUNTER — Encounter: Payer: Self-pay | Admitting: Nurse Practitioner

## 2020-10-03 DIAGNOSIS — Z0000 Encounter for general adult medical examination without abnormal findings: Secondary | ICD-10-CM

## 2020-10-03 NOTE — Telephone Encounter (Signed)
[name], Debra Madden are scheduled for a virtual visit with your provider today.    Just as we do with appointments in the office, we must obtain your consent to participate.  Your consent will be active for this visit and any virtual visit you may have with one of our providers in the next 365 days.    If you have a MyChart account, I can also send a copy of this consent to you electronically.  All virtual visits are billed to your insurance company just like a traditional visit in the office.  As this is a virtual visit, video technology does not allow for your provider to perform a traditional examination.  This may limit your provider's ability to fully assess your condition.  If your provider identifies any concerns that need to be evaluated in person or the need to arrange testing such as labs, EKG, etc, we will make arrangements to do so.    Although advances in technology are sophisticated, we cannot ensure that it will always work on either your end or our end.  If the connection with a video visit is poor, we may have to switch to a telephone visit.  With either a video or telephone visit, we are not always able to ensure that we have a secure connection.   I need to obtain your verbal consent now.   Are you willing to proceed with your visit today?   Debra Madden has provided verbal consent on 10/03/2020 for a virtual visit (video or telephone).   Elveria Royals, CMA 10/03/2020  10:40 AM

## 2020-10-03 NOTE — Patient Instructions (Signed)
Debra Madden , Thank you for taking time to come for your Medicare Wellness Visit. I appreciate your ongoing commitment to your health goals. Please review the following plan we discussed and let me know if I can assist you in the future.   Screening recommendations/referrals: Colonoscopy aged out Mammogram agedout Bone Density up to date Recommended yearly ophthalmology/optometry visit for glaucoma screening and checkup Recommended yearly dental visit for hygiene and checkup  Vaccinations: Influenza vaccine up to date Pneumococcal vaccine up to dat Tdap vaccine you have declined Shingles vaccine RECOMMENDED- to get at your local pharmacy  Advanced directives: on file  Conditions/risks identified: cardiovascular risk.   Next appointment: 1 year.    Preventive Care 79 Years and Older, Female Preventive care refers to lifestyle choices and visits with your health care provider that can promote health and wellness. What does preventive care include?  A yearly physical exam. This is also called an annual well check.  Dental exams once or twice a year.  Routine eye exams. Ask your health care provider how often you should have your eyes checked.  Personal lifestyle choices, including:  Daily care of your teeth and gums.  Regular physical activity.  Eating a healthy diet.  Avoiding tobacco and drug use.  Limiting alcohol use.  Practicing safe sex.  Taking low-dose aspirin every day.  Taking vitamin and mineral supplements as recommended by your health care provider. What happens during an annual well check? The services and screenings done by your health care provider during your annual well check will depend on your age, overall health, lifestyle risk factors, and family history of disease. Counseling  Your health care provider may ask you questions about your:  Alcohol use.  Tobacco use.  Drug use.  Emotional well-being.  Home and relationship  well-being.  Sexual activity.  Eating habits.  History of falls.  Memory and ability to understand (cognition).  Work and work Astronomer.  Reproductive health. Screening  You may have the following tests or measurements:  Height, weight, and BMI.  Blood pressure.  Lipid and cholesterol levels. These may be checked every 5 years, or more frequently if you are over 5 years old.  Skin check.  Lung cancer screening. You may have this screening every year starting at age 55 if you have a 30-pack-year history of smoking and currently smoke or have quit within the past 15 years.  Fecal occult blood test (FOBT) of the stool. You may have this test every year starting at age 79.  Flexible sigmoidoscopy or colonoscopy. You may have a sigmoidoscopy every 5 years or a colonoscopy every 10 years starting at age 79.  Hepatitis C blood test.  Hepatitis B blood test.  Sexually transmitted disease (STD) testing.  Diabetes screening. This is done by checking your blood sugar (glucose) after you have not eaten for a while (fasting). You may have this done every 1-3 years.  Bone density scan. This is done to screen for osteoporosis. You may have this done starting at age 79.  Mammogram. This may be done every 1-2 years. Talk to your health care provider about how often you should have regular mammograms. Talk with your health care provider about your test results, treatment options, and if necessary, the need for more tests. Vaccines  Your health care provider may recommend certain vaccines, such as:  Influenza vaccine. This is recommended every year.  Tetanus, diphtheria, and acellular pertussis (Tdap, Td) vaccine. You may need a Td booster every 79 years.  Zoster vaccine. You may need this after age 79.  Pneumococcal 13-valent conjugate (PCV13) vaccine. One dose is recommended after age 79.  Pneumococcal polysaccharide (PPSV23) vaccine. One dose is recommended after age  79. Talk to your health care provider about which screenings and vaccines you need and how often you need them. This information is not intended to replace advice given to you by your health care provider. Make sure you discuss any questions you have with your health care provider. Document Released: 11/03/2015 Document Revised: 06/26/2016 Document Reviewed: 08/08/2015 Elsevier Interactive Patient Education  2017 [place] Prevention in the Home Falls can cause injuries. They can happen to people of all ages. There are many things you can do to make your home safe and to help prevent falls. What can I do on the outside of my home?  Regularly fix the edges of walkways and driveways and fix any cracks.  Remove anything that might make you trip as you walk through a door, such as a raised step or threshold.  Trim any bushes or trees on the path to your home.  Use bright outdoor lighting.  Clear any walking paths of anything that might make someone trip, such as rocks or tools.  Regularly check to see if handrails are loose or broken. Make sure that both sides of any steps have handrails.  Any raised decks and porches should have guardrails on the edges.  Have any leaves, snow, or ice cleared regularly.  Use sand or salt on walking paths during winter.  Clean up any spills in your garage right away. This includes oil or grease spills. What can I do in the bathroom?  Use night lights.  Install grab bars by the toilet and in the tub and shower. Do not use towel bars as grab bars.  Use non-skid mats or decals in the tub or shower.  If you need to sit down in the shower, use a plastic, non-slip stool.  Keep the floor dry. Clean up any water that spills on the floor as soon as it happens.  Remove soap buildup in the tub or shower regularly.  Attach bath mats securely with double-sided non-slip rug tape.  Do not have throw rugs and other things on the floor that can make  you trip. What can I do in the bedroom?  Use night lights.  Make sure that you have a light by your bed that is easy to reach.  Do not use any sheets or blankets that are too big for your bed. They should not hang down onto the floor.  Have a firm chair that has side arms. You can use this for support while you get dressed.  Do not have throw rugs and other things on the floor that can make you trip. What can I do in the kitchen?  Clean up any spills right away.  Avoid walking on wet floors.  Keep items that you use a lot in easy-to-reach places.  If you need to reach something above you, use a strong step stool that has a grab bar.  Keep electrical cords out of the way.  Do not use floor polish or wax that makes floors slippery. If you must use wax, use non-skid floor wax.  Do not have throw rugs and other things on the floor that can make you trip. What can I do with my stairs?  Do not leave any items on the stairs.  Make sure that  there are handrails on both sides of the stairs and use them. Fix handrails that are broken or loose. Make sure that handrails are as long as the stairways.  Check any carpeting to make sure that it is firmly attached to the stairs. Fix any carpet that is loose or worn.  Avoid having throw rugs at the top or bottom of the stairs. If you do have throw rugs, attach them to the floor with carpet tape.  Make sure that you have a light switch at the top of the stairs and the bottom of the stairs. If you do not have them, ask someone to add them for you. What else can I do to help prevent falls?  Wear shoes that:  Do not have high heels.  Have rubber bottoms.  Are comfortable and fit you well.  Are closed at the toe. Do not wear sandals.  If you use a stepladder:  Make sure that it is fully opened. Do not climb a closed stepladder.  Make sure that both sides of the stepladder are locked into place.  Ask someone to hold it for you, if  possible.  Clearly mark and make sure that you can see:  Any grab bars or handrails.  First and last steps.  Where the edge of each step is.  Use tools that help you move around (mobility aids) if they are needed. These include:  Canes.  Walkers.  Scooters.  Crutches.  Turn on the lights when you go into a dark area. Replace any light bulbs as soon as they burn out.  Set up your furniture so you have a clear path. Avoid moving your furniture around.  If any of your floors are uneven, fix them.  If there are any pets around you, be aware of where they are.  Review your medicines with your doctor. Some medicines can make you feel dizzy. This can increase your chance of falling. Ask your doctor what other things that you can do to help prevent falls. This information is not intended to replace advice given to you by your health care provider. Make sure you discuss any questions you have with your health care provider. Document Released: 08/03/2009 Document Revised: 03/14/2016 Document Reviewed: 11/11/2014 Elsevier Interactive Patient Education  2017 [employer].

## 2020-10-03 NOTE — Progress Notes (Signed)
This service is provided via telemedicine  No vital signs collected/recorded due to the encounter was a telemedicine visit.   Location of patient (ex: home, work):  Home  Patient consents to a telephone visit: Yes, see encounter dated 10/03/2020  Location of the provider (ex: office, home): [facility]  Name of any referring provider: N/A  Names of all persons participating in the telemedicine service and their role in the encounter:  Abbey Chatters, Nurse Practitioner, Elveria Royals, CMA, and patient.   Time spent on call: 10 minutes with medical assistant

## 2020-10-03 NOTE — Progress Notes (Signed)
Subjective:   Debra Madden is a 79 y.o. female who presents for Medicare Annual (Subsequent) preventive examination.  Review of Systems     Cardiac Risk Factors include: hypertension;diabetes mellitus;dyslipidemia;advanced age (>33men, >22 women);obesity (BMI >30kg/m2);family history of premature cardiovascular disease     Objective:    There were no vitals filed for this visit. There is no height or weight on file to calculate BMI.  Advanced Directives 10/03/2020 05/15/2020 11/17/2019 09/27/2019 04/28/2019 09/23/2018 06/18/2018  Does Patient Have a Medical Advance Directive? Yes Yes Yes Yes Yes Yes Yes  Type of Estate agent of [place];Living will;Out of facility DNR (pink MOST or yellow form) Healthcare Power of [employer] Power of [employer] Power of [employer] Power of [employer] Power of Attorney Healthcare Power of Attorney  Does patient want to make changes to medical advance directive? No - Patient declined No - Patient declined No - Patient declined No - Patient declined No - Patient declined No - Patient declined -  Copy of Healthcare Power of Attorney in Chart? Yes - validated most recent copy scanned in chart (See row information) Yes - validated most recent copy scanned in chart (See row information) Yes - validated most recent copy scanned in chart (See row information) Yes - validated most recent copy scanned in chart (See row information) Yes - validated most recent copy scanned in chart (See row information) Yes - validated most recent copy scanned in chart (See row information) Yes  Pre-existing out of facility DNR order (yellow form or pink MOST form) Yellow form placed in chart (order not valid for inpatient use) - - - - - -    Current Medications (verified) Outpatient Encounter Medications as of 10/03/2020  Medication Sig   allopurinol (ZYLOPRIM) 300 MG tablet TAKE 1 TABLET DAILY   amLODipine (NORVASC) 5 MG  tablet TAKE 1 TABLET DAILY   aspirin EC 325 MG tablet Take 325 mg by mouth daily.   atorvastatin (LIPITOR) 40 MG tablet Take 1 tablet (40 mg total) by mouth daily.   BIOTIN 5000 PO Take 1 capsule by mouth daily.   folic acid (FOLVITE) 400 MCG tablet Take 800 mcg by mouth daily.    glucose blood (ONETOUCH VERIO) test strip Use to test blood sugar once daily. Dx: E11.9   hydrochlorothiazide (HYDRODIURIL) 25 MG tablet TAKE 1 TABLET DAILY   latanoprost (XALATAN) 0.005 % ophthalmic solution Place 1 drop into both eyes at bedtime.   losartan (COZAAR) 100 MG tablet TAKE 1 TABLET BY MOUTH EVERY DAY   metFORMIN (GLUCOPHAGE) 500 MG tablet TAKE 1 TABLET DAILY   OneTouch Delica Lancets 33G MISC Use to test blood sugar once daily. Dx: E11.9   No facility-administered encounter medications on file as of 10/03/2020.    Allergies (verified) Bactrim [sulfamethoxazole-trimethoprim], Clindamycin/lincomycin, Oxycodone, and Penicillins   History: Past Medical History:  Diagnosis Date   Arthritis    DM (diabetes mellitus) (HCC)    Hypercalcemia    Hypercholesteremia    Hypertension    Rheumatoid arteritis (HCC)    Past Surgical History:  Procedure Laterality Date   bladder prolapse  2018   Dr. Cranston Neighbor   carpal tunnel Bilateral 1998   DILATION AND CURETTAGE OF UTERUS  1964   Dr. Alease Medina   MENISCUS REPAIR Right 2010   Dr. Cranston Neighbor   PARTIAL HYSTERECTOMY  1973   ovaries remain   Stroke  1994   TIA   TONSILLECTOMY  1956  Family History  Problem Relation Age of Onset   Hypertension Son    Hypertension Son    Hypertension Son    Social History   Socioeconomic History   Marital status: Married    Spouse name: Not on file   Number of children: Not on file   Years of education: Not on file   Highest education level: Not on file  Occupational History   Not on file  Tobacco Use   Smoking status: Former Smoker    Types: Cigarettes    Smokeless tobacco: Never Used   Tobacco comment: Quit at age 74  Vaping Use   Vaping Use: Never used  Substance and Sexual Activity   Alcohol use: Yes    Alcohol/week: 1.0 - 2.0 standard drink    Types: 1 - 2 Glasses of wine per week   Drug use: Never   Sexual activity: Not Currently  Other Topics Concern   Not on file  Social History Narrative   Social History      Diet? Meats, fish, salads, occasionally shellfish, eggs, vegs, broc, cauliflower, brusel sprouts, green beans, mixed vegs, potatoes, some pasta/rice.       Do you drink/eat things with caffeine? Coffee- 1 1/2 cup per day      Marital status?           married                         What year were you married? 1964      Do you live in a house, apartment, assisted living, condo, trailer, etc.? house      Is it one or more stories? One- no stairs      How many persons live in your home? 2      Do you have any pets in your home? (please list) no      Highest level of education completed? 4 year college degree      Current or past profession: teacher      Do you exercise?         yes                             Type & how often? Walk, water aerobics      Advanced Directives      Do you have a living will? yes      Do you have a DNR form?        no                          If not, do you want to discuss one?      Do you have signed POA/HPOA for forms? yes      Functional Status      Do you have difficulty bathing or dressing yourself? no      Do you have difficulty preparing food or eating? no      Do you have difficulty managing your medications? no      Do you have difficulty managing your finances? no      Do you have difficulty affording your medications? no   Social Determinants of Health   Financial Resource Strain: Not on file  Food Insecurity: Not on file  Transportation Needs: Not on file  Physical Activity: Not on file  Stress: Not on file  Social Connections: Not on  file     Tobacco Counseling Counseling given: Not Answered Comment: Quit at age 45   Clinical Intake:  Pre-visit preparation completed: Yes  Pain : No/denies pain     BMI - recorded: 31 Nutritional Status: BMI > 30  Obese Nutritional Risks: None  How often do you need to have someone help you when you read instructions, pamphlets, or other written materials from your doctor or pharmacy?: 1 - Never  Diabetic?yes         Activities of Daily Living In your present state of health, do you have any difficulty performing the following activities: 10/03/2020  Hearing? N  Vision? Y  Difficulty concentrating or making decisions? N  Walking or climbing stairs? N  Dressing or bathing? N  Doing errands, shopping? N  Preparing Food and eating ? N  Using the Toilet? N  In the past six months, have you accidently leaked urine? Y  Do you have problems with loss of bowel control? N  Managing your Medications? N  Managing your Finances? N  Housekeeping or managing your Housekeeping? N  Some recent data might be hidden    Patient Care Team: Sharon Seller, NP as PCP - General (Geriatric Medicine)  Indicate any recent Medical Services you may have received from other than [facility] providers in the past year (date may be approximate).     Assessment:   This is a routine wellness examination for Debra Madden.  Hearing/Vision screen  Hearing Screening             Right ear:           Left ear:           Comments: Patient states she has no hearing problems  Vision Screening Comments: Patient wears glasses and is scheduled for cataract surgery in Feb. 2022. Patient had last eye exam in October 2021.  Dietary issues and exercise activities discussed: Exercise limited by: orthopedic condition(s)  Goals     Patient Stated     Improved knee pain      Weight (lb) < 160 lb (72.6 kg)     Weight loss through diet.       Depression  Screen PHQ 2/9 Scores 10/03/2020 05/15/2020 11/17/2019 09/27/2019 09/23/2018 03/17/2018  PHQ - 2 Score 0 0 0 0 0 0    Fall Risk Fall Risk  10/03/2020 05/15/2020 11/17/2019 09/27/2019 04/28/2019  Falls in the past year? 0 0 0 0 0  Number falls in past yr: 0 0 0 0 0  Injury with Fall? 0 0 0 0 0    FALL RISK PREVENTION PERTAINING TO THE HOME:  Any stairs in or around the home? No  If so, are there any without handrails? No  Home free of loose throw rugs in walkways, pet beds, electrical cords, etc? Yes  Adequate lighting in your home to reduce risk of falls? Yes   ASSISTIVE DEVICES UTILIZED TO PREVENT FALLS:  Life alert? No  Use of a cane, walker or w/c? No  Grab bars in the bathroom? Yes  Shower chair or bench in shower? Yes  Elevated toilet seat or a handicapped toilet? No   TIMED UP AND GO:  Was the test performed? No .   Cognitive Function: MMSE - Mini Mental State Exam 09/23/2018  Orientation to time 5  Orientation to Place 5  Registration 3  Attention/ Calculation 5  Recall 2  Language- name 2 objects 2  Language- repeat 1  Language- follow 3  step command 3  Language- read & follow direction 1  Write a sentence 1  Copy design 1  Total score 29     6CIT Screen 10/03/2020 09/27/2019  What Year? 0 points 0 points  What month? 0 points 0 points  What time? 0 points 0 points  Count back from 20 0 points 0 points  Months in reverse 0 points 0 points  Repeat phrase 0 points 2 points  Total Score 0 2    Immunizations Immunization History  Administered Date(s) Administered   Fluad Quad(high Dose 65+) 07/10/2020   Influenza Split 07/20/2012   Influenza, High Dose Seasonal PF 07/06/2014, 07/04/2016, 07/17/2017, 06/22/2019   Influenza, Quadrivalent, Recombinant, Inj, Pf 07/07/2013   Influenza, Seasonal, Injecte, Preservative Fre 08/11/2015   Influenza-Unspecified 06/21/2018, 06/16/2019   PFIZER SARS-COV-2 Vaccination 11/05/2019, 11/26/2019, 08/07/2020    Pneumococcal Conjugate-13 04/06/2015, 09/05/2016   Pneumococcal Polysaccharide-23 08/05/2012, 10/21/2014   Zoster 09/05/2016    TDAP status: Due, Education has been provided regarding the importance of this vaccine. Advised may receive this vaccine at local pharmacy or Health Dept. Aware to provide a copy of the vaccination record if obtained from local pharmacy or Health Dept. Verbalized acceptance and understanding.  Flu Vaccine status: Up to date  Pneumococcal vaccine status: Up to date  Covid-19 vaccine status: Completed vaccines  Qualifies for Shingles Vaccine? Yes   Zostavax completed Yes   Shingrix Completed?: No.    Education has been provided regarding the importance of this vaccine. Patient has been advised to call insurance company to determine out of pocket expense if they have not yet received this vaccine. Advised may also receive vaccine at local pharmacy or Health Dept. Verbalized acceptance and understanding.  Screening Tests Health Maintenance  Topic Date Due   FOOT EXAM  09/24/2019   Hepatitis C Screening  05/15/2021 (Originally April 26, 1941)   TETANUS/TDAP  05/15/2024 (Originally 04/21/1960)   HEMOGLOBIN A1C  11/15/2020   OPHTHALMOLOGY EXAM  08/29/2021   INFLUENZA VACCINE  Completed   DEXA SCAN  Completed   COVID-19 Vaccine  Completed   PNA vac Low Risk Adult  Completed    Health Maintenance  Health Maintenance Due  Topic Date Due   FOOT EXAM  09/24/2019    Colorectal cancer screening: No longer required.   Mammogram status: No longer required due to age.  Bone Density status: Completed 2020. Results reflect: Bone density results: NORMAL. Repeat every 2 years.  Lung Cancer Screening: (Low Dose CT Chest recommended if Age 85-80 years, 30 pack-year currently smoking OR have quit w/in 15years.) does not qualify.   Lung Cancer Screening Referral: na  Additional Screening:  Hepatitis C Screening: does qualify; pt declines  Vision Screening:  Recommended annual ophthalmology exams for early detection of glaucoma and other disorders of the eye. Is the patient up to date with their annual eye exam?  Yes  Who is the provider or what is the name of the office in which the patient attends annual eye exams? [facility] If pt is not established with a provider, would they like to be referred to a provider to establish care? No .   Dental Screening: Recommended annual dental exams for proper oral hygiene  Community Resource Referral / Chronic Care Management: CRR required this visit?  No   CCM required this visit?  No      Plan:     I have personally reviewed and noted the following in the patients chart:    Medical and social history  Use of alcohol, tobacco or illicit drugs   Current medications and supplements  Functional ability and status  Nutritional status  Physical activity  Advanced directives  List of other physicians  Hospitalizations, surgeries, and ER visits in previous 12 months  Vitals  Screenings to include cognitive, depression, and falls  Referrals and appointments  In addition, I have reviewed and discussed with patient certain preventive protocols, quality metrics, and best practice recommendations. A written personalized care plan for preventive services as well as general preventive health recommendations were provided to patient.     Sharon Seller, NP   10/03/2020    Virtual Visit via Telephone Note  I connected with@ on 10/03/20 at 10:00 AM EST by telephone and verified that I am speaking with the correct person using two identifiers.  Location: Patient: home Provider: [facility]   I discussed the limitations, risks, security and privacy concerns of performing an evaluation and management service by telephone and the availability of in person appointments. I also discussed with the patient that there may be a patient responsible charge related to this service. The patient expressed  understanding and agreed to proceed.   I discussed the assessment and treatment plan with the patient. The patient was provided an opportunity to ask questions and all were answered. The patient agreed with the plan and demonstrated an understanding of the instructions.   The patient was advised to call back or seek an in-person evaluation if the symptoms worsen or if the condition fails to improve as anticipated.  I provided 18 minutes of non-face-to-face time during this encounter.  Janene Harvey. [employer] printed and mailed

## 2020-10-15 ENCOUNTER — Other Ambulatory Visit: Payer: Self-pay | Admitting: Nurse Practitioner

## 2020-10-21 HISTORY — PX: CATARACT EXTRACTION: SUR2

## 2020-10-23 ENCOUNTER — Other Ambulatory Visit: Payer: Self-pay | Admitting: Nurse Practitioner

## 2020-11-15 ENCOUNTER — Other Ambulatory Visit: Payer: Self-pay

## 2020-11-15 ENCOUNTER — Encounter: Payer: Self-pay | Admitting: Nurse Practitioner

## 2020-11-15 ENCOUNTER — Ambulatory Visit (INDEPENDENT_AMBULATORY_CARE_PROVIDER_SITE_OTHER): Payer: Medicare HMO | Admitting: Nurse Practitioner

## 2020-11-15 VITALS — BP 138/76 | HR 102 | Temp 96.8°F | Ht 62.0 in | Wt 165.2 lb

## 2020-11-15 DIAGNOSIS — M1711 Unilateral primary osteoarthritis, right knee: Secondary | ICD-10-CM

## 2020-11-15 DIAGNOSIS — I10 Essential (primary) hypertension: Secondary | ICD-10-CM

## 2020-11-15 DIAGNOSIS — E559 Vitamin D deficiency, unspecified: Secondary | ICD-10-CM | POA: Diagnosis not present

## 2020-11-15 DIAGNOSIS — M1A9XX0 Chronic gout, unspecified, without tophus (tophi): Secondary | ICD-10-CM

## 2020-11-15 DIAGNOSIS — E782 Mixed hyperlipidemia: Secondary | ICD-10-CM | POA: Diagnosis not present

## 2020-11-15 DIAGNOSIS — E119 Type 2 diabetes mellitus without complications: Secondary | ICD-10-CM

## 2020-11-15 NOTE — Patient Instructions (Signed)
Can take tylenol 1000 mg by mouth every 8 hours as needed for pain.

## 2020-11-15 NOTE — Progress Notes (Signed)
Careteam: Patient Care Team: Lauree Chandler, NP as PCP - General (Geriatric Medicine)  PLACE OF SERVICE:  [place]  Advanced Directive information    Allergies  Allergen Reactions  . Bactrim [Sulfamethoxazole-Trimethoprim] Diarrhea  . Clindamycin/Lincomycin Rash and Diarrhea    Terrible stomach cramps per patient  . Oxycodone Nausea And Vomiting and Nausea Only  . Penicillins Rash, Hives and Swelling    Chief Complaint  Patient presents with  . Medical Management of Chronic Issues    6 month follow-up. FYI cataract right eye surgery pending for 11/22/2020. Discuss need for Foot exam and A1c      HPI: Patient is a 80 y.o. female for routine follow up.  R Knee osteoarthritis- seeing Dr. Ninfa Linden, started on cortisone injections and gel but no change in pain. Taking tylenol for pain management. Not interested in physical therapy.   Cataract surgery- scheduled next week on both eyes, two weeks apart. Dr Presley Raddle ophthalmology  HTN- blood pressure 138/76 today. Reports goal BP at home  Gout- no recent flare up, on allopuroinol 300 mg daily  DM- 98 blood sugar this morning. Keeps track of sugars at home. Continues on metformin 500 mg daily.  Hyperlipidemia- continues taking lipitor, working on diet modifications  Review of Systems:  Review of Systems  Constitutional: Negative for chills, fever and weight loss.  HENT: Negative for tinnitus.   Respiratory: Negative for cough, sputum production and shortness of breath.   Cardiovascular: Negative for chest pain, palpitations and leg swelling.  Gastrointestinal: Negative for abdominal pain, constipation, nausea and vomiting.  Genitourinary: Negative for dysuria, frequency and urgency.  Musculoskeletal: Positive for joint pain (R knee pain (seeing ortho)). Negative for back pain, falls and myalgias.  Skin: Negative.   Neurological: Negative for dizziness, weakness and headaches.  Psychiatric/Behavioral:  Negative for depression and memory loss. The patient does not have insomnia.     Past Medical History:  Diagnosis Date  . Arthritis   . DM (diabetes mellitus) ([place])   . Hypercalcemia   . Hypercholesteremia   . Hypertension   . Rheumatoid arteritis ([place])    Past Surgical History:  Procedure Laterality Date  . bladder prolapse  2018   Dr. Ladean Raya  . carpal tunnel Bilateral 1998  . DILATION AND CURETTAGE OF UTERUS  1964   Dr. Leonia Reader  . MENISCUS REPAIR Right 2010   Dr. Ladean Raya  . PARTIAL HYSTERECTOMY  1973   ovaries remain  . Stroke  1994   TIA  . TONSILLECTOMY  1956   Social History:   reports that she has quit smoking. Her smoking use included cigarettes. She has never used smokeless tobacco. She reports current alcohol use of about 1.0 - 2.0 standard drink of alcohol per week. She reports that she does not use drugs.  Family History  Problem Relation Age of Onset  . Hypertension Son   . Hypertension Son   . Hypertension Son     Medications: Patient's Medications  New Prescriptions   No medications on file  Previous Medications   ALLOPURINOL (ZYLOPRIM) 300 MG TABLET    TAKE 1 TABLET DAILY   AMLODIPINE (NORVASC) 5 MG TABLET    TAKE 1 TABLET DAILY   ASPIRIN EC 325 MG TABLET    Take 325 mg by mouth daily.   ATORVASTATIN (LIPITOR) 40 MG TABLET    TAKE 1 TABLET DAILY   BIOTIN 09323 MCG TABS    Take 1 tablet by mouth daily  at 12 noon.   CHOLECALCIFEROL (VITAMIN D3 PO)    Take 1 capsule by mouth daily at 12 noon.   FOLIC ACID (FOLVITE) 559 MCG TABLET    Take 800 mcg by mouth daily.    GLUCOSE BLOOD (ONETOUCH VERIO) TEST STRIP    Use to test blood sugar once daily. Dx: E11.9   HYDROCHLOROTHIAZIDE (HYDRODIURIL) 25 MG TABLET    TAKE 1 TABLET DAILY   LATANOPROST (XALATAN) 0.005 % OPHTHALMIC SOLUTION    Place 1 drop into both eyes at bedtime.   LOSARTAN (COZAAR) 100 MG TABLET    TAKE 1 TABLET BY MOUTH EVERY DAY   METFORMIN (GLUCOPHAGE) 500 MG TABLET    TAKE  1 TABLET DAILY   ONETOUCH DELICA LANCETS 74B MISC    Use to test blood sugar once daily. Dx: E11.9  Modified Medications   No medications on file  Discontinued Medications   BIOTIN 5000 PO    Take 1 capsule by mouth daily.    Physical Exam:  Vitals:   11/15/20 0950  BP: 138/76  Pulse: (!) 102  Temp: (!) 96.8 F (36 C)  TempSrc: Temporal  SpO2: 98%  Weight: 165 lb 3.2 oz (74.9 kg)  Height: 5' 2"  (1.575 m)   Body mass index is 30.22 kg/m. Wt Readings from Last 3 Encounters:  11/15/20 165 lb 3.2 oz (74.9 kg)  05/15/20 167 lb (75.8 kg)  04/28/19 168 lb (76.2 kg)    Physical Exam Constitutional:      General: She is not in acute distress.    Appearance: Normal appearance. She is not diaphoretic.  HENT:     Head: Normocephalic and atraumatic.     Mouth/Throat:     Pharynx: No oropharyngeal exudate.  Eyes:     Conjunctiva/sclera: Conjunctivae normal.     Pupils: Pupils are equal, round, and reactive to light.  Cardiovascular:     Rate and Rhythm: Normal rate and regular rhythm.     Heart sounds: Normal heart sounds.  Pulmonary:     Effort: Pulmonary effort is normal.     Breath sounds: Normal breath sounds.  Abdominal:     General: Bowel sounds are normal.     Palpations: Abdomen is soft.  Musculoskeletal:        General: No swelling or tenderness. Normal range of motion.  Skin:    General: Skin is warm and dry.  Neurological:     Mental Status: She is alert and oriented to person, place, and time.     Labs reviewed: Basic Metabolic Panel: Recent Labs    01/12/20 0958 05/15/20 1025  NA 136 141  K 4.2 4.3  CL 100 100  CO2 21 25  GLUCOSE 121* 118*  BUN 18 17  CREATININE 0.69 0.74  CALCIUM 11.2* 11.6*   Liver Function Tests: Recent Labs    01/12/20 0958 05/15/20 1025  AST 19 19  ALT 18 15  BILITOT 0.8 1.2  PROT 7.0 7.1   No results for input(s): LIPASE, AMYLASE in the last 8760 hours. No results for input(s): AMMONIA in the last 8760  hours. CBC: Recent Labs    01/12/20 0958 05/15/20 1025  WBC 8.2 6.9  NEUTROABS 4,633 4,147  HGB 15.1 14.7  HCT 45.3* 44.4  MCV 94.8 94.7  PLT 274 314   Lipid Panel: Recent Labs    01/12/20 0958 05/15/20 1025  CHOL 194 182  HDL 60 60  LDLCALC 105* 94  TRIG 176* 179*  CHOLHDL 3.2  3.0   TSH: No results for input(s): TSH in the last 8760 hours. A1C: Lab Results  Component Value Date   HGBA1C 5.9 (H) 05/15/2020     Assessment/Plan 1. Mixed hyperlipidemia LDL goal less than 70, Continues on lipitor, will follow up labs. Dietary modifications encouraged.  - Lipid Panel - CMP with eGFR([facility])  2. Chronic gout without tophus, unspecified cause, unspecified site Stable, No recent flares, continues taking allopurinol 342m daily  3. Hypercalcemia Stable, not on supplement  - CMP with eGFR([facility])  4. Vitamin D deficiency Continue taking Vitamin D, will check levels - Vitamin D, 25-hydroxy  5. Type 2 diabetes mellitus without complication, without long-term current use of insulin (HCC) Educated on dietary modifications, routine foot monitoring and eye exams -continues on metformin 500 mg daily  - CMP with eGFR([facility]) - Hemoglobin A1c  6. Essential hypertension with goal blood pressure less than 140/90 Continue checking at home, encouraged low sodium diet. Continue taking Losartan, HCTZ, and Norvasc - CMP with eGFR([facility]) - CBC with Differential/Platelet  7. Primary osteoarthritis of right knee Ongoing, continue seeing Dr. BNinfa Linden  -declines PT -can use tylenol 1000 mg by mouth every 8 hours as needed for pain  Next appt: 6 months [facility]ak [facility] [phone]

## 2020-11-16 LAB — LIPID PANEL
Cholesterol: 176 mg/dL (ref <200)
HDL: 65 mg/dL (ref >=50)
LDL Cholesterol (Calc): 86 mg/dL (calc)
Non-HDL Cholesterol (Calc): 111 mg/dL (calc) (ref <130)
Total CHOL/HDL Ratio: 2.7 (calc) (ref <5.0)
Triglycerides: 156 mg/dL — ABNORMAL HIGH (ref <150)

## 2020-11-16 LAB — COMPLETE METABOLIC PANEL WITH GFR
AG Ratio: 2.2 (calc) (ref 1.0–2.5)
ALT: 14 U/L (ref 6–29)
AST: 19 U/L (ref 10–35)
Albumin: 4.9 g/dL (ref 3.6–5.1)
Alkaline phosphatase (APISO): 104 U/L (ref 37–153)
BUN: 18 mg/dL (ref 7–25)
CO2: 25 mmol/L (ref 20–32)
Calcium: 11.1 mg/dL — ABNORMAL HIGH (ref 8.6–10.4)
Chloride: 102 mmol/L (ref 98–110)
Creat: 0.73 mg/dL (ref 0.60–0.93)
GFR, Est African American: 91 mL/min/{1.73_m2} (ref >=60)
GFR, Est Non African American: 78 mL/min/{1.73_m2} (ref >=60)
Globulin: 2.2 g/dL (calc) (ref 1.9–3.7)
Glucose, Bld: 100 mg/dL — ABNORMAL HIGH (ref 65–99)
Potassium: 3.9 mmol/L (ref 3.5–5.3)
Sodium: 138 mmol/L (ref 135–146)
Total Bilirubin: 1 mg/dL (ref 0.2–1.2)
Total Protein: 7.1 g/dL (ref 6.1–8.1)

## 2020-11-16 LAB — CBC WITH DIFFERENTIAL/PLATELET
Absolute Monocytes: 432 cells/uL (ref 200–950)
Basophils Absolute: 47 cells/uL (ref 0–200)
Basophils Relative: 0.5 %
Eosinophils Absolute: 56 cells/uL (ref 15–500)
Eosinophils Relative: 0.6 %
HCT: 46.2 % — ABNORMAL HIGH (ref 35.0–45.0)
Hemoglobin: 15.9 g/dL — ABNORMAL HIGH (ref 11.7–15.5)
Lymphs Abs: 2284 cells/uL (ref 850–3900)
MCH: 32.3 pg (ref 27.0–33.0)
MCHC: 34.4 g/dL (ref 32.0–36.0)
MCV: 93.9 fL (ref 80.0–100.0)
MPV: 9.9 fL (ref 7.5–12.5)
Monocytes Relative: 4.6 %
Neutro Abs: 6580 cells/uL (ref 1500–7800)
Neutrophils Relative %: 70 %
Platelets: 291 10*3/uL (ref 140–400)
RBC: 4.92 10*6/uL (ref 3.80–5.10)
RDW: 12.1 % (ref 11.0–15.0)
Total Lymphocyte: 24.3 %
WBC: 9.4 10*3/uL (ref 3.8–10.8)

## 2020-11-16 LAB — VITAMIN D 25 HYDROXY (VIT D DEFICIENCY, FRACTURES): Vit D, 25-Hydroxy: 87 ng/mL (ref 30–100)

## 2020-11-16 LAB — HEMOGLOBIN A1C
Hgb A1c MFr Bld: 5.9 % of total Hgb — ABNORMAL HIGH (ref <5.7)
Mean Plasma Glucose: 123 mg/dL
eAG (mmol/L): 6.8 mmol/L

## 2020-12-09 ENCOUNTER — Other Ambulatory Visit: Payer: Self-pay | Admitting: Nurse Practitioner

## 2020-12-09 DIAGNOSIS — I10 Essential (primary) hypertension: Secondary | ICD-10-CM

## 2021-01-03 ENCOUNTER — Other Ambulatory Visit: Payer: Self-pay | Admitting: Nurse Practitioner

## 2021-01-03 DIAGNOSIS — I10 Essential (primary) hypertension: Secondary | ICD-10-CM

## 2021-01-17 ENCOUNTER — Ambulatory Visit: Payer: Medicare HMO | Admitting: Orthopaedic Surgery

## 2021-01-24 ENCOUNTER — Ambulatory Visit (INDEPENDENT_AMBULATORY_CARE_PROVIDER_SITE_OTHER): Payer: Medicare HMO | Admitting: Orthopaedic Surgery

## 2021-01-24 ENCOUNTER — Encounter: Payer: Self-pay | Admitting: Orthopaedic Surgery

## 2021-01-24 VITALS — Ht 62.0 in | Wt 165.0 lb

## 2021-01-24 DIAGNOSIS — M1711 Unilateral primary osteoarthritis, right knee: Secondary | ICD-10-CM | POA: Diagnosis not present

## 2021-01-24 DIAGNOSIS — G8929 Other chronic pain: Secondary | ICD-10-CM

## 2021-01-24 DIAGNOSIS — M25561 Pain in right knee: Secondary | ICD-10-CM

## 2021-01-24 NOTE — Progress Notes (Signed)
Office Visit Note   Patient: Debra Madden           Date of Birth: 04-27-1941           MRN: [id number] Visit Date: 01/24/2021              Requested by: Sharon Seller, NP [address] [place],  [place] [address] PCP: Sharon Seller, NP   Assessment & Plan: Visit Diagnoses:  1. Chronic pain of right knee   2. Unilateral primary osteoarthritis, right knee     Plan: We had a long thorough discussion about knee replacement surgery.  I showed her knee model and went over what the surgery involves including the interoperative and postoperative course.  We talked about the risk and benefits of surgery as well in detail.  All questions and concerns were answered and addressed.  We will work on getting this scheduled in the near future.  Follow-Up Instructions: Return for 2 weeks post-op.   Orders:  No orders of the defined types were placed in this encounter.  No orders of the defined types were placed in this encounter.     Procedures: No procedures performed   Clinical Data: No additional findings.   Subjective: Chief Complaint  Patient presents with  . Right Knee - Pain  The patient is well-known to me.  She is 80 years old and has debilitating end-stage arthritis of her right knee.  We have been trying conservative treatment for several years now.  This includes steroid injections and hyaluronic acid injections.  She is work on activity modification and taking anti-inflammatories.  She is also tried topical anti-inflammatories and patches as well.  At this point her right knee pain is detriment affecting her mobility, her quality of life and her actives daily living.  She is diabetic but has a hemoglobin A1c of below 6.  At this point she wants to consider knee replacement surgery given the failed conservative treatment and her daily pain with the right knee.  HPI  Review of Systems There is currently listed no headache, chest pain, shortness of breath,  fever, chills, nausea, vomiting  Objective: Vital Signs: Ht 5\' 2"  (1.575 m)   Wt 165 lb (74.8 kg)   BMI 30.18 kg/m   Physical Exam She is alert and orient x3 and in no acute distress Ortho Exam Examination of her right knee shows varus malalignment.  There is global tenderness throughout the flexion extension arc.  The knee is ligamentously stable but there is significant medial joint line tenderness and patellofemoral crepitation. Specialty Comments:  No specialty comments available.  Imaging: No results found. Previous x-rays of the right knee show severe end-stage arthritis with varus malalignment, medial joint space narrowing that is quite significant, para-articular osteophytes in all 3 compartments and significant patellofemoral arthritic changes.  PMFS History: Patient Active Problem List   Diagnosis Date Noted  . Chronic gout without tophus 03/17/2018  . Primary osteoarthritis involving multiple joints 03/17/2018  . High risk medication use 03/17/2018  . Mixed hyperlipidemia 03/17/2018  . SUI (stress urinary incontinence, female) 08/01/2017  . 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/31/2017  . Left flank pain 10/09/2015  . Type 2 diabetes mellitus without complication, without long-term current use of insulin (HCC) 10/09/2015  . Essential hypertension with goal blood pressure less than 140/90 04/06/2015  . Pure hypercholesterolemia 10/05/2014  . Hypercalcemia 09/21/2013  . ETD (eustachian tube dysfunction) 01/24/2012  . Right carotid bruit 04/04/2011  Past Medical History:  Diagnosis Date  . Arthritis   . DM (diabetes mellitus) (HCC)   . Hypercalcemia   . Hypercholesteremia   . Hypertension   . 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 Hypertension Son   . Hypertension Son   . Hypertension Son     Past Surgical History:  Procedure Laterality Date  . bladder prolapse  2018   Dr. Cranston Neighbor  . carpal tunnel  Bilateral 1998  . DILATION AND CURETTAGE OF UTERUS  1964   Dr. Alease Medina  . MENISCUS REPAIR Right 2010   Dr. Cranston Neighbor  . PARTIAL HYSTERECTOMY  1973   ovaries remain  . Stroke  1994   TIA  . TONSILLECTOMY  1956   Social History   Occupational History  . Not on file  Tobacco Use  . Smoking status: Former Smoker    Types: Cigarettes  . Smokeless tobacco: Never Used  . Tobacco comment: Quit at age 70  Vaping Use  . Vaping Use: Never used  Substance and Sexual Activity  . Alcohol use: Yes    Alcohol/week: 1.0 - 2.0 standard drink    Types: 1 - 2 Glasses of wine per week  . Drug use: Never  . Sexual activity: Not Currently

## 2021-02-07 ENCOUNTER — Other Ambulatory Visit: Payer: Self-pay

## 2021-02-11 ENCOUNTER — Other Ambulatory Visit: Payer: Self-pay | Admitting: Nurse Practitioner

## 2021-03-06 ENCOUNTER — Other Ambulatory Visit: Payer: Self-pay | Admitting: Physician Assistant

## 2021-03-08 NOTE — Progress Notes (Signed)
Surgical Instructions    Your procedure is scheduled on 03/13/21.  Report to [facility] Main Entrance "A" at 12:20 P.M., then check in with the Admitting office.  Call this number if you have problems the morning of surgery:  [phone]   If you have any questions prior to your surgery date call [phone]: Open Monday-Friday 8am-4pm    Remember:  Do not eat after midnight the night before your surgery  You may drink clear liquids until 11:20 AM the morning of your surgery.   Clear liquids allowed are: Water, Non-Citrus Juices (without pulp), Carbonated Beverages, Clear Tea, Black Coffee Only, and Gatorade  Patient Instructions  . The night before surgery:  o No food after midnight. ONLY clear liquids after midnight  . The day of surgery (if you have diabetes): o Drink ONE (1) 10 oz water bottle given to you in your pre admission testing appointment by 11:20 AM the morning of surgery. Drink in one sitting. Do not sip.  o This drink was given to you during your hospital  pre-op appointment visit.  o Nothing else to drink after completing the  10 oz bottle of water.          If you have questions, please contact your surgeon's office.     Take these medicines the morning of surgery with A SIP OF WATER  acetaminophen (TYLENOL) if needed allopurinol (ZYLOPRIM) amLODipine (NORVASC) atorvastatin (LIPITOR)   As of today, STOP taking any Aspirin (unless otherwise instructed by your surgeon) Aleve, Naproxen, Ibuprofen, Motrin, Advil, Goody's, BC's, all herbal medications, fish oil, and all vitamins.   WHAT DO I DO ABOUT MY DIABETES MEDICATION?   Marland Kitchen Do not take oral diabetes medicines (pills) the morning of surgery.  . THE MORNING OF SURGERY, do not take metFORMIN (GLUCOPHAGE).  . The day of surgery, do not take other diabetes injectables, including Byetta (exenatide), Bydureon (exenatide ER), Victoza (liraglutide), or Trulicity (dulaglutide).  . If your CBG is greater than  220 mg/dL, you may take  of your sliding scale (correction) dose of insulin.   HOW TO MANAGE YOUR DIABETES BEFORE AND AFTER SURGERY  Why is it important to control my blood sugar before and after surgery? . Improving blood sugar levels before and after surgery helps healing and can limit problems. . A way of improving blood sugar control is eating a healthy diet by: o  Eating less sugar and carbohydrates o  Increasing activity/exercise o  Talking with your doctor about reaching your blood sugar goals . High blood sugars (greater than 180 mg/dL) can raise your risk of infections and slow your recovery, so you will need to focus on controlling your diabetes during the weeks before surgery. . Make sure that the doctor who takes care of your diabetes knows about your planned surgery including the date and location.  How do I manage my blood sugar before surgery? . Check your blood sugar at least 4 times a day, starting 2 days before surgery, to make sure that the level is not too high or low. . Check your blood sugar the morning of your surgery when you wake up and every 2 hours until you get to the Short Stay unit. o If your blood sugar is less than 70 mg/dL, you will need to treat for low blood sugar: - Do not take insulin. - Treat a low blood sugar (less than 70 mg/dL) with  cup of clear juice (cranberry or apple), 4 glucose tablets, OR glucose  gel. - Recheck blood sugar in 15 minutes after treatment (to make sure it is greater than 70 mg/dL). If your blood sugar is not greater than 70 mg/dL on recheck, call [phone] for further instructions. . Report your blood sugar to the short stay nurse when you get to Short Stay.  . If you are admitted to the hospital after surgery: o Your blood sugar will be checked by the staff and you will probably be given insulin after surgery (instead of oral diabetes medicines) to make sure you have good blood sugar levels. o The goal for blood sugar  control after surgery is 80-180 mg/dL.                      Do not wear jewelry, make up, or nail polish            Do not wear lotions, powders, perfumes, or deodorant.            Do not shave 48 hours prior to surgery.              Do not bring valuables to the hospital.            [facility] is not responsible for any belongings or valuables.  Do NOT Smoke (Tobacco/Vaping) or drink Alcohol 24 hours prior to your procedure If you use a CPAP at night, you may bring all equipment for your overnight stay.   Contacts, glasses, dentures or bridgework may not be worn into surgery, please bring cases for these belongings   For patients admitted to the hospital, discharge time will be determined by your treatment team.   Patients discharged the day of surgery will not be allowed to drive home, and someone needs to stay with them for 24 hours.    Special instructions:    Oral Hygiene is also important to reduce your risk of infection.  Remember - BRUSH YOUR TEETH THE MORNING OF SURGERY WITH YOUR REGULAR TOOTHPASTE   [place]- Preparing For Surgery  Before surgery, you can play an important role. Because skin is not sterile, your skin needs to be as free of germs as possible. You can reduce the number of germs on your skin by washing with CHG (chlorahexidine gluconate) Soap before surgery.  CHG is an antiseptic cleaner which kills germs and bonds with the skin to continue killing germs even after washing.     Please do not use if you have an allergy to CHG or antibacterial soaps. If your skin becomes reddened/irritated stop using the CHG.  Do not shave (including legs and underarms) for at least 48 hours prior to first CHG shower. It is OK to shave your face.  Please follow these instructions carefully.    1.  Shower the NIGHT BEFORE SURGERY and the MORNING OF SURGERY with CHG Soap.   If you chose to wash your hair, wash your hair first as usual with your normal shampoo. After you  shampoo, rinse your hair and body thoroughly to remove the shampoo.  Then [employer] and genitals (private parts) with your normal soap and rinse thoroughly to remove soap.  2. After that Use CHG Soap as you would any other liquid soap. You can apply CHG directly to the skin and wash gently with a scrungie or a clean washcloth.   3. Apply the CHG Soap to your body ONLY FROM THE NECK DOWN.  Do not use on open wounds or open sores. Avoid contact  with your eyes, ears, mouth and genitals (private parts). Wash Face and genitals (private parts)  with your normal soap.   4. Wash thoroughly, paying special attention to the area where your surgery will be performed.  5. Thoroughly rinse your body with warm water from the neck down.  6. DO NOT shower/wash with your normal soap after using and rinsing off the CHG Soap.  7. Pat yourself dry with a CLEAN TOWEL.  8. Wear CLEAN PAJAMAS to bed the night before surgery  9. Place CLEAN SHEETS on your bed the night before your surgery  10. DO NOT SLEEP WITH PETS.   Day of Surgery: Take a shower with CHG soap. Wear Clean/Comfortable clothing the morning of surgery Do not apply any deodorants/lotions.   Remember to brush your teeth WITH YOUR REGULAR TOOTHPASTE.   Please read over the following fact sheets that you were given.

## 2021-03-09 ENCOUNTER — Encounter (HOSPITAL_COMMUNITY): Payer: Self-pay

## 2021-03-09 ENCOUNTER — Other Ambulatory Visit: Payer: Self-pay

## 2021-03-09 ENCOUNTER — Encounter (HOSPITAL_COMMUNITY)
Admission: RE | Admit: 2021-03-09 | Discharge: 2021-03-09 | Disposition: A | Discharge status: Home / Self Care | Payer: Medicare HMO | Source: Ambulatory Visit | Attending: Orthopaedic Surgery | Admitting: Orthopaedic Surgery

## 2021-03-09 DIAGNOSIS — Z01818 Encounter for other preprocedural examination: Secondary | ICD-10-CM | POA: Insufficient documentation

## 2021-03-09 DIAGNOSIS — Z20822 Contact with and (suspected) exposure to covid-19: Secondary | ICD-10-CM | POA: Insufficient documentation

## 2021-03-09 HISTORY — DX: Other specified postprocedural states: Z98.890

## 2021-03-09 HISTORY — DX: Other specified postprocedural states: R11.2

## 2021-03-09 HISTORY — DX: Gout, unspecified: M10.9

## 2021-03-09 HISTORY — DX: Cerebral infarction, unspecified: I63.9

## 2021-03-09 LAB — CBC
HCT: 44.7 % (ref 36.0–46.0)
Hemoglobin: 14.9 g/dL (ref 12.0–15.0)
MCH: 32.5 pg (ref 26.0–34.0)
MCHC: 33.3 g/dL (ref 30.0–36.0)
MCV: 97.4 fL (ref 80.0–100.0)
Platelets: 311 10*3/uL (ref 150–400)
RBC: 4.59 MIL/uL (ref 3.87–5.11)
RDW: 14 % (ref 11.5–15.5)
WBC: 9 10*3/uL (ref 4.0–10.5)
nRBC: 0 % (ref 0.0–0.2)

## 2021-03-09 LAB — BASIC METABOLIC PANEL
Anion gap: 9 (ref 5–15)
BUN: 19 mg/dL (ref 8–23)
CO2: 26 mmol/L (ref 22–32)
Calcium: 10.8 mg/dL — ABNORMAL HIGH (ref 8.9–10.3)
Chloride: 103 mmol/L (ref 98–111)
Creatinine, Ser: 0.69 mg/dL (ref 0.44–1.00)
GFR, Estimated: >60 mL/min (ref >60)
Glucose, Bld: 115 mg/dL — ABNORMAL HIGH (ref 70–99)
Potassium: 4.4 mmol/L (ref 3.5–5.1)
Sodium: 138 mmol/L (ref 135–145)

## 2021-03-09 LAB — GLUCOSE, CAPILLARY: Glucose-Capillary: 102 mg/dL — ABNORMAL HIGH (ref 70–99)

## 2021-03-09 LAB — SURGICAL PCR SCREEN
MRSA, PCR: NEGATIVE
Staphylococcus aureus: NEGATIVE

## 2021-03-09 LAB — TYPE AND SCREEN
ABO/RH(D): O POS
Antibody Screen: NEGATIVE

## 2021-03-09 LAB — HEMOGLOBIN A1C
Hgb A1c MFr Bld: 6 % — ABNORMAL HIGH (ref 4.8–5.6)
Mean Plasma Glucose: 125.5 mg/dL

## 2021-03-09 LAB — SARS CORONAVIRUS 2 (TAT 6-24 HRS): SARS Coronavirus 2: NEGATIVE

## 2021-03-09 NOTE — Progress Notes (Addendum)
PCP - Abbey Chatters NP Cardiologist - denies  PPM/ICD - denies   Chest x-ray - n/a EKG - 03/09/21 Stress Test - 07/30/17 (records requested) was normal per pt  ECHO - denies Cardiac Cath - denies  Sleep Study - denies  Has diabetes...checks blood sugar once a day. CBGs run between 96-112  As of today, STOP taking any Aspirin (unless otherwise instructed by your surgeon) Aleve, Naproxen, Ibuprofen, Motrin, Advil, Goody's, BC's, all herbal medications, fish oil, and all vitamins.  ERAS Protcol -yes PRE-SURGERY Ensure or G2- water given  COVID TEST- 03/09/21   Anesthesia review: yes, BP slightly elevated in PAT and mildly tachy with HR 102-104 in PAT. Fayrene Fearing, PA-C notified and will send chart to him for review. Pt advised to check BP's at home until the DOS  Patient denies shortness of breath, fever, cough and chest pain at PAT appointment   All instructions explained to the patient, with a verbal understanding of the material. Patient agrees to go over the instructions while at home for a better understanding. Patient also instructed to self quarantine after being tested for COVID-19. The opportunity to ask questions was provided.

## 2021-03-12 NOTE — Progress Notes (Signed)
Anesthesia Chart Review:  In 2018 patient was noted to have new right bundle branch block on preop EKG prior to undergoing robotic sacrocolpopexy.  Due to this, she was seen by PCP and nuclear stress test was ordered.  Test showed mild ischemia in the inferior lateral wall limited by bowel attenuation artifact in the same distribution.  Normal LV systolic function with EF 62%.  Clinical correlation was recommended.  Patient did subsequently undergo surgery without incident.  More recently she was evaluated by cardiologist Dr. Jacques Navy at [facility] for palpitations.  Last seen 04/28/2019, per note she cut back on caffeine and alcohol and her palpitations improved.  Recommended continue observation.  Patient noted to have history of resting mild tachycardia with heart rate ~100-104 (pt reports HR in the 90s when she checks at home).  Heart rate 104 at preop visit.  Blood pressure modestly elevated at 160/80.  She reports she did take her antihypertensive medications (amlodipine, HCTZ, losartan).  Pulse Readings from Last 3 Encounters:  03/09/21 (!) 101  11/15/20 (!) 102  05/15/20 100   DM2 well-controlled, A1c 6.0 on preop labs.  Remainder of preop labs unremarkable.  I called and spoke with the patient due to her mildly elevated pulse rate and blood pressure and her history of palpitations.  She reports that from cardiac standpoint she feels she is doing very well.  Palpitations are rare.  States that blood pressure and heart rate tend to be elevated in medical settings but she monitors these at home and blood pressures are generally under 140/90 and heart rates are often in the low 90s.  She says she has no functional limitations other than related to orthopedic pain.  She can go up multiple flights of stairs without undue shortness of breath or chest pain.  She reports she has never had any chest pain or syncope.   Reviewed history with Dr. Noreene Larsson, he stated that due to her good functional status and  absence of cardiopulmonary symptoms she could proceed with surgery as planned.  EKG 03/09/2021: Sinus tachycardia.  Rate 104.  Low voltage QRS.  Right bundle block.  Possible anterior infarct, age undetermined.  Nuclear stress 07/30/2017: Electrocardiographic Data:  Patient was injected with IV Lexiscan over 10 seconds. Baseline EKG demonstrated NSR -right bundle branch block Stress EKG demonstrated no ischemia. Patient had no chest pain. Test wasstopped secondary to protocol  completion.  There were no arrhythmias. Patient had stable BP response to Lexiscan.    SPECT Imaging: Demonstrates a reversible moderate sized mild defect in the inferolateral wall. This is in the setting of bowel attenuation artifact in the same distribution. The LV cavity size is normal. The TID ratio is normal at 1.13  .   3D Reconstruction/Gated imaging. Demonstrates normal LV systolic function =  62 % with no wall motion abnormalities.   Summary:  1. Abnormal nuclear stress test with mild ischemia in the inferior lateral wall limited by bowel attenuation artifact in that same distribution. Clinical correlation    2. Normal LV systolic function = 62 %.     Zannie Cove [facility] Short Stay Center/Anesthesiology Phone [phone] 03/12/2021 11:08 AM

## 2021-03-12 NOTE — Anesthesia Preprocedure Evaluation (Addendum)
Anesthesia Evaluation  Patient identified by MRN, date of birth, ID band Patient awake    Reviewed: Allergy & Precautions, H&P , NPO status , Patient's Chart, lab work & pertinent test results  History of Anesthesia Complications (+) PONV and history of anesthetic complications  Airway Mallampati: II   Neck ROM: full    Dental   Pulmonary former smoker,    breath sounds clear to auscultation       Cardiovascular hypertension,  Rhythm:regular Rate:Normal     Neuro/Psych TIA   GI/Hepatic   Endo/Other  diabetes, Type 2  Renal/GU      Musculoskeletal  (+) Arthritis , Rheumatoid disorders,    Abdominal   Peds  Hematology   Anesthesia Other Findings   Reproductive/Obstetrics                            Anesthesia Physical Anesthesia Plan  ASA: III  Anesthesia Plan: Regional and Spinal   Post-op Pain Management:  Regional for Post-op pain   Induction: Intravenous  PONV Risk Score and Plan: 3 and Propofol infusion, Ondansetron and Treatment may vary due to age or medical condition  Airway Management Planned: Simple Face Mask  Additional Equipment:   Intra-op Plan:   Post-operative Plan:   Informed Consent: I have reviewed the patients History and Physical, chart, labs and discussed the procedure including the risks, benefits and alternatives for the proposed anesthesia with the patient or authorized representative who has indicated his/her understanding and acceptance.     Dental advisory given  Plan Discussed with: CRNA, Anesthesiologist and Surgeon  Anesthesia Plan Comments: (PAT note by Antionette Poles, PA-C: In 2018 patient was noted to have new right bundle branch block on preop EKG prior to undergoing robotic sacrocolpopexy.  Due to this, she was seen by PCP and nuclear stress test was ordered.  Test showed mild ischemia in the inferior lateral wall limited by bowel attenuation  artifact in the same distribution.  Normal LV systolic function with EF 62%.  Clinical correlation was recommended.  Patient did subsequently undergo surgery without incident.  More recently she was evaluated by cardiologist Dr. Jacques Navy at [facility] for palpitations.  Last seen 04/28/2019, per note she cut back on caffeine and alcohol and her palpitations improved.  Recommended continue observation.  Patient noted to have history of resting mild tachycardia with heart rate ~100-104 (pt reports HR in the 90s when she checks at home).  Heart rate 104 at preop visit.  Blood pressure modestly elevated at 160/80.  She reports she did take her antihypertensive medications (amlodipine, HCTZ, losartan).  Pulse Readings from Last 3 Encounters: 03/09/21 (!) 101 11/15/20 (!) 102 05/15/20 100  DM2 well-controlled, A1c 6.0 on preop labs.  Remainder of preop labs unremarkable.  I called and spoke with the patient due to her mildly elevated pulse rate and blood pressure and her history of palpitations.  She reports that from cardiac standpoint she feels she is doing very well.  Palpitations are rare.  States that blood pressure and heart rate tend to be elevated in medical settings but she monitors these at home and blood pressures are generally under 140/90 and heart rates are often in the low 90s.  She says she has no functional limitations other than related to orthopedic pain.  She can go up multiple flights of stairs without undue shortness of breath or chest pain.  She reports she has never had any chest pain or  syncope.   Reviewed history with Dr. Noreene Larsson, he stated that due to her good functional status and absence of cardiopulmonary symptoms she could proceed with surgery as planned.  EKG 03/09/2021: Sinus tachycardia.  Rate 104.  Low voltage QRS.  Right bundle block.  Possible anterior infarct, age undetermined.  Nuclear stress 07/30/2017: Electrocardiographic Data:  Patient was injected with IV Lexiscan over  10 seconds. Baseline EKG demonstrated NSR -right bundle branch block Stress EKG demonstrated no ischemia. Patient had no chest pain. Test wasstopped secondary to protocol  completion.  There were no arrhythmias. Patient had stable BP response to Lexiscan.    SPECT Imaging: Demonstrates a reversible moderate sized mild defect in the inferolateral wall. This is in the setting of bowel attenuation artifact in the same distribution. The LV cavity size is normal. The TID ratio is normal at 1.13  .   3D Reconstruction/Gated imaging. Demonstrates normal LV systolic function =  62 % with no wall motion abnormalities.   Summary:  1. Abnormal nuclear stress test with mild ischemia in the inferior lateral wall limited by bowel attenuation artifact in that same distribution. Clinical correlation    2. Normal LV systolic function = 62 %.   )       Anesthesia Quick Evaluation

## 2021-03-13 ENCOUNTER — Ambulatory Visit (HOSPITAL_COMMUNITY): Payer: Medicare HMO | Admitting: Anesthesiology

## 2021-03-13 ENCOUNTER — Ambulatory Visit (HOSPITAL_COMMUNITY): Payer: Medicare HMO | Admitting: Physician Assistant

## 2021-03-13 ENCOUNTER — Other Ambulatory Visit: Payer: Self-pay

## 2021-03-13 ENCOUNTER — Encounter (HOSPITAL_COMMUNITY)
Admission: RE | Disposition: A | Discharge status: Home / Self Care | Payer: Self-pay | Source: Home / Self Care | Attending: Orthopaedic Surgery

## 2021-03-13 ENCOUNTER — Observation Stay (HOSPITAL_COMMUNITY): Payer: Medicare HMO

## 2021-03-13 ENCOUNTER — Encounter (HOSPITAL_COMMUNITY): Payer: Self-pay | Admitting: Orthopaedic Surgery

## 2021-03-13 ENCOUNTER — Observation Stay (HOSPITAL_COMMUNITY)
Admission: RE | Admit: 2021-03-13 | Discharge: 2021-03-14 | Disposition: A | Discharge status: Home / Self Care | Payer: Medicare HMO | Attending: Orthopaedic Surgery | Admitting: Orthopaedic Surgery

## 2021-03-13 DIAGNOSIS — E119 Type 2 diabetes mellitus without complications: Secondary | ICD-10-CM | POA: Insufficient documentation

## 2021-03-13 DIAGNOSIS — M1711 Unilateral primary osteoarthritis, right knee: Principal | ICD-10-CM

## 2021-03-13 DIAGNOSIS — Z96651 Presence of right artificial knee joint: Secondary | ICD-10-CM

## 2021-03-13 DIAGNOSIS — Z79818 Long term (current) use of other agents affecting estrogen receptors and estrogen levels: Secondary | ICD-10-CM | POA: Insufficient documentation

## 2021-03-13 DIAGNOSIS — Z8673 Personal history of transient ischemic attack (TIA), and cerebral infarction without residual deficits: Secondary | ICD-10-CM | POA: Diagnosis not present

## 2021-03-13 DIAGNOSIS — Z87891 Personal history of nicotine dependence: Secondary | ICD-10-CM | POA: Insufficient documentation

## 2021-03-13 DIAGNOSIS — I10 Essential (primary) hypertension: Secondary | ICD-10-CM | POA: Diagnosis not present

## 2021-03-13 DIAGNOSIS — Z79899 Other long term (current) drug therapy: Secondary | ICD-10-CM | POA: Diagnosis not present

## 2021-03-13 HISTORY — PX: TOTAL KNEE ARTHROPLASTY: SHX125

## 2021-03-13 LAB — GLUCOSE, CAPILLARY
Glucose-Capillary: 109 mg/dL — ABNORMAL HIGH (ref 70–99)
Glucose-Capillary: 107 mg/dL — ABNORMAL HIGH (ref 70–99)
Glucose-Capillary: 95 mg/dL (ref 70–99)

## 2021-03-13 LAB — ABO/RH: ABO/RH(D): O POS

## 2021-03-13 SURGERY — ARTHROPLASTY, KNEE, TOTAL
Anesthesia: Regional | Site: Knee | Laterality: Right

## 2021-03-13 MED ORDER — FENTANYL CITRATE (PF) 100 MCG/2ML IJ SOLN
50.0000 ug | Freq: Once | INTRAMUSCULAR | Status: AC
Start: 2021-03-13 — End: 2021-03-13
  Administered 2021-03-13: 50 ug via INTRAVENOUS

## 2021-03-13 MED ORDER — ROPIVACAINE HCL 7.5 MG/ML IJ SOLN
INTRAMUSCULAR | Status: DC | PRN
  Administered 2021-03-13: 20 mL via PERINEURAL

## 2021-03-13 MED ORDER — ONDANSETRON HCL 4 MG/2ML IJ SOLN
INTRAMUSCULAR | Status: AC
  Filled 2021-03-13: qty 2

## 2021-03-13 MED ORDER — FENTANYL CITRATE (PF) 100 MCG/2ML IJ SOLN
INTRAMUSCULAR | Status: AC
  Filled 2021-03-13: qty 2

## 2021-03-13 MED ORDER — SODIUM CHLORIDE 0.9 % IR SOLN
Status: DC | PRN
  Administered 2021-03-13: 3000 mL

## 2021-03-13 MED ORDER — AMLODIPINE BESYLATE 5 MG PO TABS
5.0000 mg | ORAL_TABLET | Freq: Every day | ORAL | Status: DC
  Administered 2021-03-14: 5 mg via ORAL
  Filled 2021-03-13: qty 1

## 2021-03-13 MED ORDER — ONDANSETRON HCL 4 MG/2ML IJ SOLN
4.0000 mg | Freq: Four times a day (QID) | INTRAMUSCULAR | Status: DC | PRN
  Administered 2021-03-13: 4 mg via INTRAVENOUS
  Filled 2021-03-13: qty 2

## 2021-03-13 MED ORDER — ALUM & MAG HYDROXIDE-SIMETH 200-200-20 MG/5ML PO SUSP: 30.0000 mL | ORAL | Status: DC | PRN

## 2021-03-13 MED ORDER — METOCLOPRAMIDE HCL 5 MG/ML IJ SOLN: 5.0000 mg | Freq: Three times a day (TID) | INTRAMUSCULAR | Status: DC | PRN

## 2021-03-13 MED ORDER — KETOROLAC TROMETHAMINE 15 MG/ML IJ SOLN
7.5000 mg | Freq: Four times a day (QID) | INTRAMUSCULAR | Status: DC
  Administered 2021-03-13 – 2021-03-14 (×3): 7.5 mg via INTRAVENOUS
  Filled 2021-03-13 (×4): qty 1

## 2021-03-13 MED ORDER — ONDANSETRON HCL 4 MG/2ML IJ SOLN: 4.0000 mg | Freq: Four times a day (QID) | INTRAMUSCULAR | Status: DC | PRN

## 2021-03-13 MED ORDER — PANTOPRAZOLE SODIUM 40 MG PO TBEC
40.0000 mg | DELAYED_RELEASE_TABLET | Freq: Every day | ORAL | Status: DC
  Administered 2021-03-13 – 2021-03-14 (×2): 40 mg via ORAL
  Filled 2021-03-13 (×2): qty 1

## 2021-03-13 MED ORDER — LOSARTAN POTASSIUM 50 MG PO TABS
100.0000 mg | ORAL_TABLET | Freq: Every day | ORAL | Status: DC
  Administered 2021-03-13 – 2021-03-14 (×2): 100 mg via ORAL
  Filled 2021-03-13 (×2): qty 2

## 2021-03-13 MED ORDER — VANCOMYCIN HCL 1000 MG/200ML IV SOLN
1000.0000 mg | Freq: Two times a day (BID) | INTRAVENOUS | Status: AC
  Administered 2021-03-14: 1000 mg via INTRAVENOUS
  Filled 2021-03-13: qty 200

## 2021-03-13 MED ORDER — ACETAMINOPHEN 325 MG PO TABS: 325.0000 mg | ORAL_TABLET | Freq: Four times a day (QID) | ORAL | Status: DC | PRN

## 2021-03-13 MED ORDER — METHOCARBAMOL 500 MG PO TABS
500.0000 mg | ORAL_TABLET | Freq: Four times a day (QID) | ORAL | Status: DC | PRN
  Administered 2021-03-13 – 2021-03-14 (×2): 500 mg via ORAL
  Filled 2021-03-13 (×4): qty 1

## 2021-03-13 MED ORDER — LIDOCAINE 2% (20 MG/ML) 5 ML SYRINGE
INTRAMUSCULAR | Status: AC
  Filled 2021-03-13: qty 5

## 2021-03-13 MED ORDER — ALLOPURINOL 300 MG PO TABS
300.0000 mg | ORAL_TABLET | Freq: Every day | ORAL | Status: DC
  Administered 2021-03-14: 300 mg via ORAL
  Filled 2021-03-13: qty 1

## 2021-03-13 MED ORDER — HYDROCHLOROTHIAZIDE 25 MG PO TABS
25.0000 mg | ORAL_TABLET | Freq: Every day | ORAL | Status: DC
  Administered 2021-03-13 – 2021-03-14 (×2): 25 mg via ORAL
  Filled 2021-03-13 (×2): qty 1

## 2021-03-13 MED ORDER — MENTHOL 3 MG MT LOZG: 1.0000 | LOZENGE | OROMUCOSAL | Status: DC | PRN

## 2021-03-13 MED ORDER — SODIUM CHLORIDE 0.9 % IV SOLN: INTRAVENOUS | Status: DC

## 2021-03-13 MED ORDER — ASPIRIN EC 325 MG PO TBEC
325.0000 mg | DELAYED_RELEASE_TABLET | Freq: Two times a day (BID) | ORAL | Status: DC
  Administered 2021-03-13 – 2021-03-14 (×2): 325 mg via ORAL
  Filled 2021-03-13 (×3): qty 1

## 2021-03-13 MED ORDER — PHENYLEPHRINE HCL-NACL 10-0.9 MG/250ML-% IV SOLN
INTRAVENOUS | Status: DC | PRN
  Administered 2021-03-13: 30 ug/min via INTRAVENOUS

## 2021-03-13 MED ORDER — BIOTIN 10000 MCG PO TABS: 10000.0000 ug | ORAL_TABLET | Freq: Every day | ORAL | Status: DC

## 2021-03-13 MED ORDER — PROPOFOL 500 MG/50ML IV EMUL
INTRAVENOUS | Status: DC | PRN
  Administered 2021-03-13: 60 ug/kg/min via INTRAVENOUS
  Administered 2021-03-13: 10 mg via INTRAVENOUS
  Administered 2021-03-13: 20 mg via INTRAVENOUS

## 2021-03-13 MED ORDER — HYDROMORPHONE HCL 1 MG/ML IJ SOLN
0.5000 mg | INTRAMUSCULAR | Status: DC | PRN
  Administered 2021-03-13: 0.5 mg via INTRAVENOUS
  Filled 2021-03-13 (×3): qty 1

## 2021-03-13 MED ORDER — POVIDONE-IODINE 10 % EX SWAB
2.0000 "application " | Freq: Once | CUTANEOUS | Status: AC
  Administered 2021-03-13: 2 via TOPICAL

## 2021-03-13 MED ORDER — ONDANSETRON HCL 4 MG/2ML IJ SOLN
INTRAMUSCULAR | Status: DC | PRN
  Administered 2021-03-13: 4 mg via INTRAVENOUS

## 2021-03-13 MED ORDER — METOCLOPRAMIDE HCL 5 MG PO TABS
5.0000 mg | ORAL_TABLET | Freq: Three times a day (TID) | ORAL | Status: DC | PRN
Start: 2021-03-13 — End: 2021-03-14
  Administered 2021-03-14: 10 mg via ORAL
  Filled 2021-03-13: qty 2

## 2021-03-13 MED ORDER — TRANEXAMIC ACID-NACL 1000-0.7 MG/100ML-% IV SOLN
1000.0000 mg | INTRAVENOUS | Status: AC
  Administered 2021-03-13: 1000 mg via INTRAVENOUS
  Filled 2021-03-13: qty 100

## 2021-03-13 MED ORDER — PHENOL 1.4 % MT LIQD
1.0000 | OROMUCOSAL | Status: DC | PRN
Start: 2021-03-13 — End: 2021-03-14

## 2021-03-13 MED ORDER — VITAMIN D 25 MCG (1000 UNIT) PO TABS
2000.0000 [IU] | ORAL_TABLET | Freq: Every day | ORAL | Status: DC
  Administered 2021-03-13 – 2021-03-14 (×2): 2000 [IU] via ORAL
  Filled 2021-03-13 (×2): qty 2

## 2021-03-13 MED ORDER — LIDOCAINE 2% (20 MG/ML) 5 ML SYRINGE
INTRAMUSCULAR | Status: DC | PRN
  Administered 2021-03-13: 30 mg via INTRAVENOUS

## 2021-03-13 MED ORDER — MIDAZOLAM HCL 2 MG/2ML IJ SOLN
INTRAMUSCULAR | Status: AC
  Administered 2021-03-13: 1 mg via INTRAVENOUS
  Filled 2021-03-13: qty 2

## 2021-03-13 MED ORDER — OXYCODONE HCL 5 MG PO TABS
5.0000 mg | ORAL_TABLET | ORAL | Status: DC | PRN
  Administered 2021-03-13: 10 mg via ORAL
  Administered 2021-03-14: 5 mg via ORAL
  Administered 2021-03-14: 10 mg via ORAL
  Filled 2021-03-13 (×2): qty 2
  Filled 2021-03-13: qty 1

## 2021-03-13 MED ORDER — METFORMIN HCL 500 MG PO TABS
500.0000 mg | ORAL_TABLET | Freq: Every day | ORAL | Status: DC
  Administered 2021-03-14: 500 mg via ORAL
  Filled 2021-03-13: qty 1

## 2021-03-13 MED ORDER — FOLIC ACID 1 MG PO TABS
1000.0000 ug | ORAL_TABLET | Freq: Every day | ORAL | Status: DC
  Administered 2021-03-13 – 2021-03-14 (×2): 1 mg via ORAL
  Filled 2021-03-13 (×2): qty 1

## 2021-03-13 MED ORDER — FENTANYL CITRATE (PF) 250 MCG/5ML IJ SOLN
INTRAMUSCULAR | Status: DC | PRN
  Administered 2021-03-13: 50 ug via INTRAVENOUS

## 2021-03-13 MED ORDER — LACTATED RINGERS IV SOLN: INTRAVENOUS | Status: DC

## 2021-03-13 MED ORDER — METHOCARBAMOL 1000 MG/10ML IJ SOLN
500.0000 mg | Freq: Four times a day (QID) | INTRAMUSCULAR | Status: DC | PRN
  Filled 2021-03-13: qty 5

## 2021-03-13 MED ORDER — LATANOPROST 0.005 % OP SOLN
1.0000 [drp] | Freq: Every day | OPHTHALMIC | Status: DC
  Administered 2021-03-13: 1 [drp] via OPHTHALMIC
  Filled 2021-03-13: qty 2.5

## 2021-03-13 MED ORDER — BUPIVACAINE IN DEXTROSE 0.75-8.25 % IT SOLN
INTRATHECAL | Status: DC | PRN
  Administered 2021-03-13: 1.6 mL via INTRATHECAL

## 2021-03-13 MED ORDER — ATORVASTATIN CALCIUM 40 MG PO TABS
40.0000 mg | ORAL_TABLET | Freq: Every day | ORAL | Status: DC
  Administered 2021-03-14: 40 mg via ORAL
  Filled 2021-03-13: qty 1

## 2021-03-13 MED ORDER — POLYETHYLENE GLYCOL 3350 17 G PO PACK: 17.0000 g | PACK | Freq: Every day | ORAL | Status: DC | PRN

## 2021-03-13 MED ORDER — VANCOMYCIN HCL IN DEXTROSE 1-5 GM/200ML-% IV SOLN
1000.0000 mg | INTRAVENOUS | Status: AC
  Administered 2021-03-13: 1000 mg via INTRAVENOUS
  Filled 2021-03-13: qty 200

## 2021-03-13 MED ORDER — MIDAZOLAM HCL 2 MG/2ML IJ SOLN: 1.0000 mg | Freq: Once | INTRAMUSCULAR | Status: AC

## 2021-03-13 MED ORDER — 0.9 % SODIUM CHLORIDE (POUR BTL) OPTIME
TOPICAL | Status: DC | PRN
  Administered 2021-03-13: 1000 mL

## 2021-03-13 MED ORDER — DIPHENHYDRAMINE HCL 12.5 MG/5ML PO ELIX
12.5000 mg | ORAL_SOLUTION | ORAL | Status: DC | PRN
Start: 2021-03-13 — End: 2021-03-14
  Filled 2021-03-13: qty 10

## 2021-03-13 MED ORDER — DOCUSATE SODIUM 100 MG PO CAPS
100.0000 mg | ORAL_CAPSULE | Freq: Two times a day (BID) | ORAL | Status: DC
  Administered 2021-03-13 – 2021-03-14 (×2): 100 mg via ORAL
  Filled 2021-03-13 (×2): qty 1

## 2021-03-13 MED ORDER — ONDANSETRON HCL 4 MG PO TABS: 4.0000 mg | ORAL_TABLET | Freq: Four times a day (QID) | ORAL | Status: DC | PRN

## 2021-03-13 MED ORDER — OXYCODONE HCL 5 MG PO TABS
10.0000 mg | ORAL_TABLET | ORAL | Status: DC | PRN
  Administered 2021-03-14: 15 mg via ORAL
  Filled 2021-03-13: qty 3

## 2021-03-13 MED ORDER — FENTANYL CITRATE (PF) 250 MCG/5ML IJ SOLN
INTRAMUSCULAR | Status: AC
  Filled 2021-03-13: qty 5

## 2021-03-13 MED ORDER — FENTANYL CITRATE (PF) 100 MCG/2ML IJ SOLN: 25.0000 ug | INTRAMUSCULAR | Status: DC | PRN

## 2021-03-13 MED ORDER — CHLORHEXIDINE GLUCONATE 0.12 % MT SOLN
15.0000 mL | Freq: Once | OROMUCOSAL | Status: AC
  Administered 2021-03-13: 15 mL via OROMUCOSAL
  Filled 2021-03-13: qty 15

## 2021-03-13 MED ORDER — ORAL CARE MOUTH RINSE: 15.0000 mL | Freq: Once | OROMUCOSAL | Status: AC

## 2021-03-13 SURGICAL SUPPLY — 69 items
BANDAGE ESMARK 6X9 LF (GAUZE/BANDAGES/DRESSINGS) ×1 IMPLANT
BASEPLATE TIBIAL TRIATHALON 3 (Plate) ×2 IMPLANT
BLADE SAG 18X100X1.27 (BLADE) ×2 IMPLANT
BNDG ELASTIC 4X5.8 VLCR STR LF (GAUZE/BANDAGES/DRESSINGS) ×2 IMPLANT
BNDG ELASTIC 6X5.8 VLCR STR LF (GAUZE/BANDAGES/DRESSINGS) ×2 IMPLANT
BNDG ESMARK 6X9 LF (GAUZE/BANDAGES/DRESSINGS) ×2
BOWL SMART MIX CTS (DISPOSABLE) ×2 IMPLANT
CEMENT BONE SIMPLEX SPEEDSET (Cement) ×2 IMPLANT
COVER SURGICAL LIGHT HANDLE (MISCELLANEOUS) ×2 IMPLANT
COVER WAND RF STERILE (DRAPES) ×2 IMPLANT
CUFF TOURN SGL QUICK 34 (TOURNIQUET CUFF) ×1
CUFF TOURN SGL QUICK 42 (TOURNIQUET CUFF) IMPLANT
CUFF TRNQT CYL 34X4.125X (TOURNIQUET CUFF) ×1 IMPLANT
DRAPE EXTREMITY T 121X128X90 (DISPOSABLE) ×2 IMPLANT
DRAPE HALF SHEET 40X57 (DRAPES) ×2 IMPLANT
DRAPE U-SHAPE 47X51 STRL (DRAPES) ×2 IMPLANT
DRSG PAD ABDOMINAL 8X10 ST (GAUZE/BANDAGES/DRESSINGS) ×2 IMPLANT
DURAPREP 26ML APPLICATOR (WOUND CARE) ×2 IMPLANT
ELECT CAUTERY BLADE 6.4 (BLADE) ×2 IMPLANT
ELECT REM PT RETURN 9FT ADLT (ELECTROSURGICAL) ×2
ELECTRODE REM PT RTRN 9FT ADLT (ELECTROSURGICAL) ×1 IMPLANT
FACESHIELD WRAPAROUND (MASK) ×4 IMPLANT
FEMORAL PEG DISTAL FIXATION (Orthopedic Implant) ×2 IMPLANT
FEMORAL POST STABILIZED NO 3 (Orthopedic Implant) ×2 IMPLANT
GAUZE SPONGE 4X4 12PLY STRL (GAUZE/BANDAGES/DRESSINGS) ×2 IMPLANT
GAUZE XEROFORM 1X8 LF (GAUZE/BANDAGES/DRESSINGS) IMPLANT
GAUZE XEROFORM 5X9 LF (GAUZE/BANDAGES/DRESSINGS) ×2 IMPLANT
GLOVE ORTHO TXT STRL SZ7.5 (GLOVE) ×2 IMPLANT
GLOVE SRG 8 PF TXTR STRL LF DI (GLOVE) ×2 IMPLANT
GLOVE SURG ORTHO 8.0 STRL STRW (GLOVE) ×2 IMPLANT
GLOVE SURG UNDER POLY LF SZ8 (GLOVE) ×2
GOWN STRL REUS W/ TWL LRG LVL3 (GOWN DISPOSABLE) IMPLANT
GOWN STRL REUS W/ TWL XL LVL3 (GOWN DISPOSABLE) ×2 IMPLANT
GOWN STRL REUS W/TWL LRG LVL3 (GOWN DISPOSABLE)
GOWN STRL REUS W/TWL XL LVL3 (GOWN DISPOSABLE) ×2
HANDPIECE INTERPULSE COAX TIP (DISPOSABLE) ×1
IMMOBILIZER KNEE 22 UNIV (SOFTGOODS) ×2 IMPLANT
INSERT PS TRIATH X3 #3 10 (Insert) ×2 IMPLANT
KIT BASIN OR (CUSTOM PROCEDURE TRAY) ×2 IMPLANT
KIT TURNOVER KIT B (KITS) ×2 IMPLANT
MANIFOLD NEPTUNE II (INSTRUMENTS) ×2 IMPLANT
NEEDLE 18GX1X1/2 (RX/OR ONLY) (NEEDLE) IMPLANT
NS IRRIG 1000ML POUR BTL (IV SOLUTION) ×2 IMPLANT
PACK TOTAL JOINT (CUSTOM PROCEDURE TRAY) ×2 IMPLANT
PAD ABD 8X10 STRL (GAUZE/BANDAGES/DRESSINGS) ×2 IMPLANT
PAD ARMBOARD 7.5X6 YLW CONV (MISCELLANEOUS) ×2 IMPLANT
PAD CAST 4YDX4 CTTN HI CHSV (CAST SUPPLIES) ×1 IMPLANT
PADDING CAST COTTON 4X4 STRL (CAST SUPPLIES) ×1
PADDING CAST COTTON 6X4 STRL (CAST SUPPLIES) ×2 IMPLANT
PATELLA TRIATHALON (Knees) ×2 IMPLANT
PIN FLUTED HEDLESS FIX 3.5X1/8 (PIN) ×2 IMPLANT
SET HNDPC FAN SPRY TIP SCT (DISPOSABLE) ×1 IMPLANT
SET PAD KNEE POSITIONER (MISCELLANEOUS) ×2 IMPLANT
STAPLER VISISTAT 35W (STAPLE) IMPLANT
STRIP CLOSURE SKIN 1/2X4 (GAUZE/BANDAGES/DRESSINGS) IMPLANT
SUCTION FRAZIER HANDLE 10FR (MISCELLANEOUS) ×1
SUCTION TUBE FRAZIER 10FR DISP (MISCELLANEOUS) ×1 IMPLANT
SUT MNCRL AB 4-0 PS2 18 (SUTURE) IMPLANT
SUT VIC AB 0 CT1 27 (SUTURE) ×1
SUT VIC AB 0 CT1 27XBRD ANBCTR (SUTURE) ×1 IMPLANT
SUT VIC AB 1 CT1 27 (SUTURE) ×2
SUT VIC AB 1 CT1 27XBRD ANBCTR (SUTURE) ×2 IMPLANT
SUT VIC AB 2-0 CT1 27 (SUTURE) ×2
SUT VIC AB 2-0 CT1 TAPERPNT 27 (SUTURE) ×2 IMPLANT
SYR 50ML LL SCALE MARK (SYRINGE) IMPLANT
TOWEL GREEN STERILE (TOWEL DISPOSABLE) ×2 IMPLANT
TOWEL GREEN STERILE FF (TOWEL DISPOSABLE) ×2 IMPLANT
TRAY CATH 16FR W/PLASTIC CATH (SET/KITS/TRAYS/PACK) IMPLANT
WRAP KNEE MAXI GEL POST OP (GAUZE/BANDAGES/DRESSINGS) ×2 IMPLANT

## 2021-03-13 NOTE — Anesthesia Postprocedure Evaluation (Signed)
Anesthesia Post Note  Patient: Debra Madden  Procedure(s) Performed: RIGHT TOTAL KNEE ARTHROPLASTY (Right Knee)     Patient location during evaluation: PACU Anesthesia Type: Regional and Spinal Level of consciousness: oriented and awake and alert Pain management: pain level controlled Vital Signs Assessment: post-procedure vital signs reviewed and stable Respiratory status: spontaneous breathing, respiratory function stable and patient connected to nasal cannula oxygen Cardiovascular status: blood pressure returned to baseline and stable Postop Assessment: no headache, no backache, no apparent nausea or vomiting and spinal receding Anesthetic complications: no   No complications documented.  Last Vitals:  Vitals:   03/13/21 1722 03/13/21 1746  BP: 137/79 (!) 159/80  Pulse: 80 73  Resp: 11 20  Temp: 36.7 C 36.6 C  SpO2: 100% 96%    Last Pain:  Vitals:   03/13/21 1746  TempSrc: Oral  PainSc:                  Juelle Dickmann,W. EDMOND

## 2021-03-13 NOTE — Transfer of Care (Signed)
Immediate Anesthesia Transfer of Care Note  Patient: Debra Madden  Procedure(s) Performed: RIGHT TOTAL KNEE ARTHROPLASTY (Right Knee)  Patient Location: PACU  Anesthesia Type:MAC combined with regional for post-op pain  Level of Consciousness: awake, alert  and patient cooperative  Airway & Oxygen Therapy: Patient Spontanous Breathing  Post-op Assessment: Report given to RN and Post -op Vital signs reviewed and stable  Post vital signs: Reviewed and stable  Last Vitals:  Vitals Value Taken Time  BP 128/74 03/13/21 1623  Temp    Pulse 73 03/13/21 1624  Resp 8 03/13/21 1624  SpO2 98 % 03/13/21 1624  Vitals shown include unvalidated device data.  Last Pain:  Vitals:   03/13/21 1244  TempSrc: Oral  PainSc: 0-No pain         Complications: No complications documented.

## 2021-03-13 NOTE — Anesthesia Procedure Notes (Signed)
Spinal  Patient location during procedure: OR Start time: 03/13/2021 2:30 PM End time: 03/13/2021 2:35 PM Reason for block: surgical anesthesia Staffing Performed: anesthesiologist  Anesthesiologist: Achille Rich, MD Preanesthetic Checklist Completed: patient identified, IV checked, risks and benefits discussed, surgical consent, monitors and equipment checked, pre-op evaluation and timeout performed Spinal Block Patient position: sitting Prep: DuraPrep Patient monitoring: cardiac monitor, continuous pulse ox and blood pressure Approach: midline Location: L3-4 Injection technique: single-shot Needle Needle type: Pencan  Needle gauge: 24 G Needle length: 9 cm Assessment Sensory level: T10 Events: CSF return Additional Notes Functioning IV was confirmed and monitors were applied. Sterile prep and drape, including hand hygiene and sterile gloves were used. The patient was positioned and the spine was prepped. The skin was anesthetized with lidocaine.  Free flow of clear CSF was obtained prior to injecting local anesthetic into the CSF.  The spinal needle aspirated freely following injection.  The needle was carefully withdrawn.  The patient tolerated the procedure well.

## 2021-03-13 NOTE — H&P (Signed)
TOTAL KNEE ADMISSION H&P  Patient is being admitted for right total knee arthroplasty.  Subjective:  Chief Complaint:right knee pain.  HPI: Debra Madden, 80 y.o. female, has a history of pain and functional disability in the right knee due to arthritis and has failed non-surgical conservative treatments for greater than 12 weeks to includeNSAID's and/or analgesics, corticosteriod injections, viscosupplementation injections, flexibility and strengthening excercises, use of assistive devices and activity modification.  Onset of symptoms was gradual, starting 5 years ago with gradually worsening course since that time. The patient noted prior procedures on the knee to include  arthroscopy on the right knee(s).  Patient currently rates pain in the right knee(s) at 10 out of 10 with activity. Patient has night pain, worsening of pain with activity and weight bearing, pain that interferes with activities of daily living, pain with passive range of motion, crepitus and joint swelling.  Patient has evidence of subchondral sclerosis, periarticular osteophytes and joint space narrowing by imaging studies.. There is no active infection.  Patient Active Problem List   Diagnosis Date Noted  . Unilateral primary osteoarthritis, right knee 03/13/2021  . Chronic gout without tophus 03/17/2018  . Primary osteoarthritis involving multiple joints 03/17/2018  . High risk medication use 03/17/2018  . Mixed hyperlipidemia 03/17/2018  . SUI (stress urinary incontinence, female) 08/01/2017  . 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/31/2017  . Left flank pain 10/09/2015  . Type 2 diabetes mellitus without complication, without long-term current use of insulin (HCC) 10/09/2015  . Essential hypertension with goal blood pressure less than 140/90 04/06/2015  . Pure hypercholesterolemia 10/05/2014  . Hypercalcemia 09/21/2013  . ETD (eustachian tube dysfunction) 01/24/2012  . Right carotid bruit 04/04/2011    Past Medical History:  Diagnosis Date  . Arthritis   . DM (diabetes mellitus) (HCC)    type 2  . Gout   . Hypercalcemia   . Hypercholesteremia   . Hypertension   . PONV (postoperative nausea and vomiting)   . Rheumatoid arteritis (HCC)   . Stroke [facility])    TIA 1994    Past Surgical History:  Procedure Laterality Date  . bladder prolapse  2018   Dr. Cranston Neighbor  . carpal tunnel Bilateral 1998  . CATARACT EXTRACTION Bilateral   . DILATION AND CURETTAGE OF UTERUS  1964   Dr. Alease Medina  . MENISCUS REPAIR Right 2010   Dr. Cranston Neighbor  . PARTIAL HYSTERECTOMY  1973   ovaries remain  . Stroke  1994   TIA  . TONSILLECTOMY  1956    Current Facility-Administered Medications  Medication Dose Route Frequency Provider Last Rate Last Admin  . lactated ringers infusion   Intravenous Continuous Jairo Ben, MD 10 mL/hr at 03/13/21 1310 New Bag at 03/13/21 1310  . midazolam (VERSED) 2 MG/2ML injection           . midazolam (VERSED) injection 1 mg  1 mg Intravenous Once Hodierne, Adam, MD      . tranexamic acid (CYKLOKAPRON) IVPB 1,000 mg  1,000 mg Intravenous To OR Kirtland Bouchard, PA-C      . vancomycin (VANCOCIN) IVPB 1000 mg/200 mL premix  1,000 mg Intravenous On Call to OR Kirtland Bouchard, PA-C 200 mL/hr at 03/13/21 1316 1,000 mg at 03/13/21 1316   Facility-Administered Medications Ordered in Other Encounters  Medication Dose Route Frequency Provider Last Rate Last Admin  . ropivacaine (PF) 7.5 mg/mL (0.75%) (NAROPIN) injection   Peri-NEURAL Anesthesia Intra-op Achille Rich, MD   20 mL  at 03/13/21 1344   Allergies  Allergen Reactions  . Bactrim [Sulfamethoxazole-Trimethoprim] Diarrhea  . Percocet [Oxycodone-Acetaminophen] Nausea And Vomiting  . Clindamycin/Lincomycin Rash and Diarrhea    Terrible stomach cramps per patient  . Oxycodone Nausea And Vomiting and Nausea Only  . Penicillins Rash, Hives and Swelling    Social History   Tobacco Use  . Smoking  status: Former Smoker    Types: Cigarettes  . Smokeless tobacco: Never Used  . Tobacco comment: Quit at age 21  Substance Use Topics  . Alcohol use: Yes    Alcohol/week: 1.0 - 2.0 standard drink    Types: 1 - 2 Glasses of wine per week    Family History  Problem Relation Age of Onset  . Hypertension Son   . Hypertension Son   . Hypertension Son      Review of Systems  Musculoskeletal: Positive for gait problem and joint swelling.  All other systems reviewed and are negative.   Objective:  Physical Exam Vitals reviewed.  Constitutional:      Appearance: Normal appearance.  HENT:     Head: Normocephalic and atraumatic.  Eyes:     Extraocular Movements: Extraocular movements intact.     Pupils: Pupils are equal, round, and reactive to light.  Cardiovascular:     Rate and Rhythm: Normal rate.     Pulses: Normal pulses.  Pulmonary:     Effort: Pulmonary effort is normal.     Breath sounds: Normal breath sounds.  Abdominal:     Palpations: Abdomen is soft.  Musculoskeletal:     Cervical back: Normal range of motion and neck supple.     Right knee: Effusion, bony tenderness and crepitus present. Decreased range of motion. Tenderness present over the medial joint line, lateral joint line and patellar tendon. Abnormal alignment and abnormal meniscus.  Neurological:     Mental Status: She is alert and oriented to person, place, and time.  Psychiatric:        Behavior: Behavior normal.     Vital signs in last 24 hours: Temp:  [97.5 F (36.4 C)] 97.5 F (36.4 C) (05/24 1244) Pulse Rate:  [102-109] 102 (05/24 1323) Resp:  [12-18] 12 (05/24 1323) BP: (172-190)/(83-84) 172/84 (05/24 1323) SpO2:  [99 %-100 %] 100 % (05/24 1323)  Labs:   Estimated body mass index is 29.84 kg/m as calculated from the following:   Height as of 03/09/21: 5\' 2"  (1.575 m).   Weight as of 03/09/21: 74 kg.   Imaging Review Plain radiographs demonstrate severe degenerative joint disease of  the right knee(s). The overall alignment ismild varus. The bone quality appears to be good for age and reported activity level.      Assessment/Plan:  End stage arthritis, right knee   The patient history, physical examination, clinical judgment of the provider and imaging studies are consistent with end stage degenerative joint disease of the right knee(s) and total knee arthroplasty is deemed medically necessary. The treatment options including medical management, injection therapy arthroscopy and arthroplasty were discussed at length. The risks and benefits of total knee arthroplasty were presented and reviewed. The risks due to aseptic loosening, infection, stiffness, patella tracking problems, thromboembolic complications and other imponderables were discussed. The patient acknowledged the explanation, agreed to proceed with the plan and consent was signed. Patient is being admitted for inpatient treatment for surgery, pain control, PT, OT, prophylactic antibiotics, VTE prophylaxis, progressive ambulation and ADL's and discharge planning. The patient is planning to be discharged  home with home health services

## 2021-03-13 NOTE — Brief Op Note (Signed)
03/13/2021  3:58 PM  PATIENT:  Arlesia A Haber  80 y.o. female  PRE-OPERATIVE DIAGNOSIS:  osteoarthritis right knee  POST-OPERATIVE DIAGNOSIS:  osteoarthritis right knee  PROCEDURE:  Procedure(s): RIGHT TOTAL KNEE ARTHROPLASTY (Right)  SURGEON:  Surgeon(s) and Role:    Kathryne Hitch, MD - Primary  PHYSICIAN ASSISTANT:  Rexene Edison, PA-C  ANESTHESIA:   regional and spinal  COUNTS:  YES  TOURNIQUET:   Total Tourniquet Time Documented: Thigh (Right) - 51 minutes Total: Thigh (Right) - 51 minutes   DICTATION: .Other Dictation: Dictation Number [id number]  PLAN OF CARE: Admit for overnight observation  PATIENT DISPOSITION:  PACU - hemodynamically stable.   Delay start of Pharmacological VTE agent (>24hrs) due to surgical blood loss or risk of bleeding: no

## 2021-03-13 NOTE — Anesthesia Procedure Notes (Signed)
Anesthesia Regional Block: Adductor canal block   Pre-Anesthetic Checklist: ,, timeout performed, Correct Patient, Correct Site, Correct Laterality, Correct Procedure, Correct Position, site marked, Risks and benefits discussed,  Surgical consent,  Pre-op evaluation,  At surgeon's request and post-op pain management  Laterality: Right  Prep: chloraprep       Needles:  Injection technique: Single-shot  Needle Type: Echogenic Needle     Needle Length: 9cm  Needle Gauge: 21     Additional Needles:   Narrative:  Start time: 03/13/2021 1:38 PM End time: 03/13/2021 1:45 PM Injection made incrementally with aspirations every 5 mL.  Performed by: Personally  Anesthesiologist: Achille Rich, MD  Additional Notes: Pt tolerated the procedure well.

## 2021-03-13 NOTE — Progress Notes (Signed)
Orthopedic Tech Progress Note Patient Details:  Debra Madden 07/08/1941 [id number] Unit called for overhead trapeze but patient was over age limit. Patient ID: Debra Madden, female   DOB: 13-Aug-1941, 80 y.o.   MRN: [id number]   Debra Madden 03/13/2021, 7:01 PM

## 2021-03-14 ENCOUNTER — Encounter (HOSPITAL_COMMUNITY): Payer: Self-pay | Admitting: Orthopaedic Surgery

## 2021-03-14 DIAGNOSIS — M1711 Unilateral primary osteoarthritis, right knee: Secondary | ICD-10-CM | POA: Diagnosis not present

## 2021-03-14 LAB — CBC
HCT: 40.3 % (ref 36.0–46.0)
Hemoglobin: 13.5 g/dL (ref 12.0–15.0)
MCH: 31.9 pg (ref 26.0–34.0)
MCHC: 33.5 g/dL (ref 30.0–36.0)
MCV: 95.3 fL (ref 80.0–100.0)
Platelets: 287 10*3/uL (ref 150–400)
RBC: 4.23 MIL/uL (ref 3.87–5.11)
RDW: 13.7 % (ref 11.5–15.5)
WBC: 14.8 10*3/uL — ABNORMAL HIGH (ref 4.0–10.5)
nRBC: 0 % (ref 0.0–0.2)

## 2021-03-14 LAB — BASIC METABOLIC PANEL
Anion gap: 12 (ref 5–15)
BUN: 7 mg/dL — ABNORMAL LOW (ref 8–23)
CO2: 25 mmol/L (ref 22–32)
Calcium: 9.9 mg/dL (ref 8.9–10.3)
Chloride: 95 mmol/L — ABNORMAL LOW (ref 98–111)
Creatinine, Ser: 0.66 mg/dL (ref 0.44–1.00)
GFR, Estimated: >60 mL/min (ref >60)
Glucose, Bld: 181 mg/dL — ABNORMAL HIGH (ref 70–99)
Potassium: 3 mmol/L — ABNORMAL LOW (ref 3.5–5.1)
Sodium: 132 mmol/L — ABNORMAL LOW (ref 135–145)

## 2021-03-14 LAB — GLUCOSE, CAPILLARY: Glucose-Capillary: 181 mg/dL — ABNORMAL HIGH (ref 70–99)

## 2021-03-14 MED ORDER — OXYCODONE HCL 5 MG PO TABS: 5.0000 mg | ORAL_TABLET | ORAL | 0 refills | Status: DC | PRN

## 2021-03-14 MED ORDER — ASPIRIN 325 MG PO TBEC: 325.0000 mg | DELAYED_RELEASE_TABLET | Freq: Two times a day (BID) | ORAL | 0 refills | Status: DC

## 2021-03-14 MED ORDER — METHOCARBAMOL 500 MG PO TABS: 500.0000 mg | ORAL_TABLET | Freq: Four times a day (QID) | ORAL | 1 refills | Status: DC | PRN

## 2021-03-14 MED ORDER — ONDANSETRON 4 MG PO TBDP: 4.0000 mg | ORAL_TABLET | Freq: Three times a day (TID) | ORAL | 0 refills | Status: DC | PRN

## 2021-03-14 NOTE — Discharge Summary (Signed)
Patient ID: Debra Madden MRN: [id number] DOB/AGE: 1941-04-01 80 y.o.  Admit date: 03/13/2021 Discharge date: 03/14/2021  Admission Diagnoses:  Principal Problem:   Unilateral primary osteoarthritis, right knee Active Problems:   Status post right knee replacement   Discharge Diagnoses:  Same  Past Medical History:  Diagnosis Date  . Arthritis   . DM (diabetes mellitus) (HCC)    type 2  . Gout   . Hypercalcemia   . Hypercholesteremia   . Hypertension   . PONV (postoperative nausea and vomiting)   . Rheumatoid arteritis (HCC)   . Stroke [facility])    TIA 1994    Surgeries: Procedure(s): RIGHT TOTAL KNEE ARTHROPLASTY on 03/13/2021   Consultants:   Discharged Condition: Improved  Hospital Course: Debra Madden is an 80 y.o. female who was admitted 03/13/2021 for operative treatment ofUnilateral primary osteoarthritis, right knee. Patient has severe unremitting pain that affects sleep, daily activities, and work/hobbies. After pre-op clearance the patient was taken to the operating room on 03/13/2021 and underwent  Procedure(s): RIGHT TOTAL KNEE ARTHROPLASTY.    Patient was given perioperative antibiotics:  Anti-infectives (From admission, onward)   Start     Dose/Rate Route Frequency Ordered Stop   03/14/21 0200  vancomycin (VANCOREADY) IVPB 1000 mg/200 mL        1,000 mg 200 mL/hr over 60 Minutes Intravenous Every 12 hours 03/13/21 1629 03/14/21 0330   03/13/21 1230  vancomycin (VANCOCIN) IVPB 1000 mg/200 mL premix        1,000 mg 200 mL/hr over 60 Minutes Intravenous On call to O.R. 03/13/21 1222 03/13/21 1817       Patient was given sequential compression devices, early ambulation, and chemoprophylaxis to prevent DVT.  Patient benefited maximally from hospital stay and there were no complications.    Recent vital signs:  Patient Vitals for the past 24 hrs:  BP Temp Temp src Pulse Resp SpO2  03/14/21 0852 (!) 152/78 -- -- 88 -- 100 %  03/14/21 0722 (!) 149/75  98.3 F (36.8 C) Oral (!) 106 17 96 %  03/14/21 0310 (!) 151/79 98.1 F (36.7 C) Oral 92 20 --  03/14/21 0005 (!) 152/85 98.1 F (36.7 C) Oral 83 20 97 %  03/13/21 2000 (!) 150/78 98.2 F (36.8 C) Oral 80 20 97 %  03/13/21 1746 (!) 159/80 97.8 F (36.6 C) Oral 73 20 96 %  03/13/21 1722 137/79 98 F (36.7 C) -- 80 11 100 %  03/13/21 1710 130/83 -- -- 85 (!) 22 100 %  03/13/21 1651 (!) 135/59 -- -- 85 12 100 %  03/13/21 1636 122/76 -- -- 75 12 100 %  03/13/21 1623 128/74 (!) 97.1 F (36.2 C) -- 76 12 97 %     Recent laboratory studies:  Recent Labs    03/14/21 0636  WBC 14.8*  HGB 13.5  HCT 40.3  PLT 287  NA 132*  K 3.0*  CL 95*  CO2 25  BUN 7*  CREATININE 0.66  GLUCOSE 181*  CALCIUM 9.9     Discharge Medications:   Allergies as of 03/14/2021      Reactions   Bactrim [sulfamethoxazole-trimethoprim] Diarrhea   Percocet [oxycodone-acetaminophen] Nausea And Vomiting   Clindamycin/lincomycin Rash, Diarrhea   Terrible stomach cramps per patient   Oxycodone Nausea And Vomiting, Nausea Only   Penicillins Rash, Hives, Swelling      Medication List    TAKE these medications   acetaminophen 500 MG tablet Commonly known as: TYLENOL Take  1,000 mg by mouth every 8 (eight) hours as needed for moderate pain.   allopurinol 300 MG tablet Commonly known as: ZYLOPRIM TAKE 1 TABLET DAILY   amLODipine 5 MG tablet Commonly known as: NORVASC TAKE 1 TABLET DAILY   aspirin 325 MG EC tablet Take 1 tablet (325 mg total) by mouth 2 (two) times daily after a meal. What changed: when to take this   atorvastatin 40 MG tablet Commonly known as: LIPITOR TAKE 1 TABLET DAILY   Biotin 26378 MCG Tabs Take 10,000 mcg by mouth daily at 12 noon.   folic acid 800 MCG tablet Commonly known as: FOLVITE Take 800 mcg by mouth daily.   hydrochlorothiazide 25 MG tablet Commonly known as: HYDRODIURIL TAKE 1 TABLET DAILY   Icy Hot 5 % Ptch Generic drug: Menthol Apply 1 patch  topically daily as needed (pain).   latanoprost 0.005 % ophthalmic solution Commonly known as: XALATAN Place 1 drop into both eyes at bedtime.   losartan 100 MG tablet Commonly known as: COZAAR TAKE 1 TABLET BY MOUTH EVERY DAY   metFORMIN 500 MG tablet Commonly known as: GLUCOPHAGE TAKE 1 TABLET DAILY What changed: when to take this   methocarbamol 500 MG tablet Commonly known as: ROBAXIN Take 1 tablet (500 mg total) by mouth every 6 (six) hours as needed for muscle spasms.   ondansetron 4 MG disintegrating tablet Commonly known as: Zofran ODT Take 1 tablet (4 mg total) by mouth every 8 (eight) hours as needed for nausea or vomiting.   OneTouch Delica Plus Lancet33G Misc USE TO TEST BLOOD SUGAR    ONCE DAILY.   OneTouch Verio test strip Generic drug: glucose blood USE TO TEST BLOOD SUGAR    ONCE DAILY   oxyCODONE 5 MG immediate release tablet Commonly known as: Oxy IR/ROXICODONE Take 1-2 tablets (5-10 mg total) by mouth every 4 (four) hours as needed for moderate pain (pain score 4-6).   Vitamin D3 50 MCG (2000 UT) capsule Take 2,000 Units by mouth daily.            Durable Medical Equipment  (From admission, onward)         Start     Ordered   03/13/21 1750  DME 3 n 1  Once        03/13/21 1749   03/13/21 1750  DME Walker rolling  Once       Question Answer Comment  Walker: With 5 Inch Wheels   Patient needs a walker to treat with the following condition Status post right knee replacement      03/13/21 1749          Diagnostic Studies: DG Knee Right Port  Result Date: 03/13/2021 CLINICAL DATA:  Follow-up total knee replacement. EXAM: PORTABLE RIGHT KNEE - 1-2 VIEW COMPARISON:  07/24/2020 FINDINGS: Two-view show total knee replacement. Components appear well positioned. Small amount of fluid and air in the joint as expected. No abnormal postoperative finding. IMPRESSION: Good appearance following total knee arthroplasty. Electronically Signed   By: Paulina Fusi M.D.   On: 03/13/2021 16:54    Disposition: Discharge disposition: 01-Home or Self Care          Follow-up Information    Kathryne Hitch, MD Follow up in 2 week(s).   Specialty: Orthopedic Surgery Contact information: [address] [place] [address] [phone]                Signed: Kathryne Hitch 03/14/2021, 3:07 PM

## 2021-03-14 NOTE — Evaluation (Signed)
Physical Therapy Evaluation Patient Details Name: Debra Madden MRN: [id number] DOB: 02-24-1941 Today's Date: 03/14/2021   History of Present Illness  Pt is a 80 y/o female with hx of arthritic pain in R knee and failed conservative treatments. 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 PMH: gout, DM II, HTN, TIA (1994).  Clinical Impression  Pt is s/p TKA resulting in the deficits listed below (see PT Problem List). At the time of PT eval pt was able to perform transfers and ambulation with gross min guard assist and RW for support. Pt limited by nausea but overall demonstrated a good rehab effort. HEP education initiated and handout provided. Anticipate pt will progress well as nausea subsides. Pt will benefit from skilled PT to increase their independence and safety with mobility to allow discharge to the venue listed below.      Follow Up Recommendations Follow surgeon's recommendation for DC plan and follow-up therapies    Equipment Recommendations  None recommended by PT    Recommendations for Other Services       Precautions / Restrictions Precautions Precautions: Fall;Knee Precaution Booklet Issued: Yes (comment) Precaution Comments: Pt was educated on NO roll/pillow/ice pack under the knee, only under ankle. Required Braces or Orthoses: None Restrictions Weight Bearing Restrictions: Yes RLE Weight Bearing: Weight bearing as tolerated      Mobility  Bed Mobility Overal bed mobility: Modified Independent             General bed mobility comments: Pt was received sitting up in the recliner.    Transfers Overall transfer level: Needs assistance Equipment used: Rolling walker (2 wheeled) Transfers: Sit to/from Stand Sit to Stand: Min guard Stand pivot transfers: Supervision       General transfer comment: VC's for hand placement on seated surface for safety. Hands on guarding provided for safety. Increased time required for power-up to full  stand.  Ambulation/Gait Ambulation/Gait assistance: Min guard Gait Distance (Feet): 200 Feet Assistive device: Rolling walker (2 wheeled) Gait Pattern/deviations: Step-through pattern;Decreased stride length;Trunk flexed;Decreased dorsiflexion - right;Decreased stance time - right Gait velocity: Decreased Gait velocity interpretation: <1.31 ft/sec, indicative of household ambulator General Gait Details: VC's for improved posture, closer walker proximity, increased heel strike, and general sequencing and safety with the RW. Pt feeling nauseated throughout gait training but overall tolerated well.  Stairs            Wheelchair Mobility    Modified Rankin (Stroke Patients Only)       Balance Overall balance assessment: Needs assistance Sitting-balance support: No upper extremity supported;Feet supported Sitting balance-Leahy Scale: Good     Standing balance support: Bilateral upper extremity supported;Single extremity supported;During functional activity Standing balance-Leahy Scale: Fair Standing balance comment: fair static standing, reliant on UE support with dynamic tasks to offload operative R LE                             Pertinent Vitals/Pain Pain Assessment: Faces Faces Pain Scale: Hurts a little bit Pain Location: R knee Pain Descriptors / Indicators: Discomfort;Grimacing Pain Intervention(s): Limited activity within patient's tolerance;Monitored during session;Repositioned    Home Living Family/patient expects to be discharged to:: Private residence Living Arrangements: Spouse/significant other Available Help at Discharge: Family;Available 24 hours/day Type of Home: House Home Access: Stairs to enter Entrance Stairs-Rails: None Entrance Stairs-Number of Steps: 1 Home Layout: One level Home Equipment: Walker - 2 wheels;Cane - single point;Bedside commode;Shower seat;Shower seat - built  in;Grab bars - tub/shower;Hand held shower head;Adaptive  equipment      Prior Function Level of Independence: Independent         Comments: no use of AD for mobility though reports furniture walking and limping in the home due to R knee pain. Independent with ADLs, IADLs     Hand Dominance   Dominant Hand: Right    Extremity/Trunk Assessment   Upper Extremity Assessment Upper Extremity Assessment: Defer to OT evaluation    Lower Extremity Assessment Lower Extremity Assessment: RLE deficits/detail RLE Deficits / Details: Decreased strength and AROM consistent with above mentioned procedure.    Cervical / Trunk Assessment Cervical / Trunk Assessment: Normal  Communication   Communication: No difficulties  Cognition Arousal/Alertness: Awake/alert Behavior During Therapy: WFL for tasks assessed/performed Overall Cognitive Status: Within Functional Limits for tasks assessed                                        General Comments      Exercises Total Joint Exercises Ankle Circles/Pumps: 10 reps Quad Sets: 10 reps Heel Slides: 10 reps Hip ABduction/ADduction: 10 reps Straight Leg Raises: 10 reps Long Arc Quad: 10 reps Goniometric ROM: ~45 knee flexion AROM   Assessment/Plan    PT Assessment Patient needs continued PT services  PT Problem List Decreased strength;Decreased range of motion;Decreased activity tolerance;Decreased balance;Decreased mobility;Decreased knowledge of use of DME;Decreased safety awareness;Decreased knowledge of precautions;Pain       PT Treatment Interventions DME instruction;Gait training;Stair training;Functional mobility training;Therapeutic activities;Therapeutic exercise;Neuromuscular re-education;Patient/family education    PT Goals (Current goals can be found in the Care Plan section)  Acute Rehab PT Goals Patient Stated Goal: go home today PT Goal Formulation: With patient Time For Goal Achievement: 03/21/21 Potential to Achieve Goals: Good    Frequency 7X/week    Barriers to discharge        Co-evaluation               AM-PAC PT "6 Clicks" Mobility  Outcome Measure Help needed turning from your back to your side while in a flat bed without using bedrails?: None Help needed moving from lying on your back to sitting on the side of a flat bed without using bedrails?: A Little Help needed moving to and from a bed to a chair (including a wheelchair)?: A Little Help needed standing up from a chair using your arms (e.g., wheelchair or bedside chair)?: A Little Help needed to walk in hospital room?: A Little Help needed climbing 3-5 steps with a railing? : A Little 6 Click Score: 19    End of Session Equipment Utilized During Treatment: Gait belt Activity Tolerance: Other (comment) (Limited by nausea) Patient left: in chair;with call bell/phone within reach Nurse Communication: Mobility status;Other (comment) (Pt requesting nausea medication, RN notified) PT Visit Diagnosis: Unsteadiness on feet (R26.81);Pain Pain - Right/Left: Right Pain - part of body: Knee    Time: 0812-0858 PT Time Calculation (min) (ACUTE ONLY): 46 min   Charges:   PT Evaluation $PT Eval Low Complexity: 1 Low PT Treatments $Gait Training: 8-22 mins $Therapeutic Exercise: 8-22 mins        [name], PT, DPT Acute Rehabilitation Services Pager: [phone] Office: [phone]   [name] 03/14/2021, 9:59 AM

## 2021-03-14 NOTE — Progress Notes (Signed)
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 PMH: gout, DM II, HTN, TIA (1994).    PT Comments    Pt progressing towards physical therapy goals. Reports increased pain this afternoon but overall tolerated treatment well. Reviewed car transfer, HEP, positioning recommendations, and general safety with mobility at home. Will continue to follow.     Follow Up Recommendations  Follow surgeon's recommendation for DC plan and follow-up therapies     Equipment Recommendations  None recommended by PT    Recommendations for Other Services       Precautions / Restrictions Precautions Precautions: Fall;Knee Precaution Booklet Issued: Yes (comment) Precaution Comments: Pt was educated on NO roll/pillow/ice pack under the knee, only under ankle. Required Braces or Orthoses: Knee Immobilizer - Right (has KI, but no specific orders on wearing schedule) Restrictions Weight Bearing Restrictions: Yes RLE Weight Bearing: Weight bearing as tolerated    Mobility  Bed Mobility Overal bed mobility: Modified Independent             General bed mobility comments: Pt was received sitting up in the recliner.    Transfers Overall transfer level: Needs assistance Equipment used: Rolling walker (2 wheeled) Transfers: Sit to/from Stand Sit to Stand: Min guard Stand pivot transfers: Supervision       General transfer comment: VC's for hand placement on seated surface for safety. Hands on guarding provided for safety. Increased time required for power-up to full stand.  Ambulation/Gait Ambulation/Gait assistance: Min guard Gait Distance (Feet): 200 Feet Assistive device: Rolling walker (2 wheeled) Gait Pattern/deviations: Step-through  pattern;Decreased stride length;Trunk flexed;Decreased dorsiflexion - right;Decreased stance time - right;Drifts right/left Gait velocity: Decreased Gait velocity interpretation: <1.31 ft/sec, indicative of household ambulator General Gait Details: VC's for improved posture, closer walker proximity, increased heel strike, and general sequencing and safety with the RW. Drifting L at times   Stairs             Wheelchair Mobility    Modified Rankin (Stroke Patients Only)       Balance Overall balance assessment: Needs assistance Sitting-balance support: No upper extremity supported;Feet supported Sitting balance-Leahy Scale: Good     Standing balance support: Bilateral upper extremity supported;Single extremity supported;During functional activity Standing balance-Leahy Scale: Fair Standing balance comment: fair static standing, reliant on UE support with dynamic tasks to offload operative R LE                            Cognition Arousal/Alertness: Awake/alert Behavior During Therapy: WFL for tasks assessed/performed Overall Cognitive Status: Within Functional Limits for tasks assessed                                        Exercises Total Joint Exercises Ankle Circles/Pumps: 10 reps Quad Sets: 10 reps Heel Slides: 10 reps Hip ABduction/ADduction: 10 reps Straight Leg Raises: 10 reps Long Arc Quad: 10 reps Goniometric ROM: 98 R knee flexion AROM    General Comments        Pertinent Vitals/Pain Pain Assessment: Faces Faces Pain Scale: Hurts even more Pain Location: R knee Pain Descriptors / Indicators: Discomfort;Grimacing;Operative site guarding  Pain Intervention(s): Limited activity within patient's tolerance;Monitored during session;Repositioned    Home Living Family/patient expects to be discharged to:: Private residence Living Arrangements: Spouse/significant other Available Help at Discharge: Family;Available 24  hours/day Type of Home: House Home Access: Stairs to enter Entrance Stairs-Rails: None Home Layout: One level Home Equipment: Environmental consultant - 2 wheels;Cane - single point;Bedside commode;Shower seat;Shower seat - built in;Grab bars - tub/shower;Hand held shower head;Adaptive equipment      Prior Function Level of Independence: Independent      Comments: no use of AD for mobility though reports furniture walking and limping in the home due to R knee pain. Independent with ADLs, IADLs   PT Goals (current goals can now be found in the care plan section) Acute Rehab PT Goals Patient Stated Goal: go home today PT Goal Formulation: With patient Time For Goal Achievement: 03/21/21 Potential to Achieve Goals: Good Progress towards PT goals: Progressing toward goals    Frequency    7X/week      PT Plan Current plan remains appropriate    Co-evaluation              AM-PAC PT "6 Clicks" Mobility   Outcome Measure  Help needed turning from your back to your side while in a flat bed without using bedrails?: None Help needed moving from lying on your back to sitting on the side of a flat bed without using bedrails?: A Little Help needed moving to and from a bed to a chair (including a wheelchair)?: A Little Help needed standing up from a chair using your arms (e.g., wheelchair or bedside chair)?: A Little Help needed to walk in hospital room?: A Little Help needed climbing 3-5 steps with a railing? : A Little 6 Click Score: 19    End of Session Equipment Utilized During Treatment: Gait belt Activity Tolerance: Patient tolerated treatment well Patient left: in chair;with call bell/phone within reach Nurse Communication: Mobility status PT Visit Diagnosis: Unsteadiness on feet (R26.81);Pain Pain - Right/Left: Right Pain - part of body: Knee     Time: 1200-1230 PT Time Calculation (min) (ACUTE ONLY): 30 min  Charges:  $Gait Training: 8-22 mins $Therapeutic Exercise: 8-22  mins                     [name], PT, DPT Acute Rehabilitation Services Pager: [phone] Office: [phone]    [name] 03/14/2021, 1:40 PM

## 2021-03-14 NOTE — Discharge Instructions (Signed)
INSTRUCTIONS AFTER JOINT REPLACEMENT   o Remove items at home which could result in a fall. This includes throw rugs or furniture in walking pathways o ICE to the affected joint every three hours while awake for 30 minutes at a time, for at least the first 3-5 days, and then as needed for pain and swelling.  Continue to use ice for pain and swelling. You may notice swelling that will progress down to the foot and ankle.  This is normal after surgery.  Elevate your leg when you are not up walking on it.   o Continue to use the breathing machine you got in the hospital (incentive spirometer) which will help keep your temperature down.  It is common for your temperature to cycle up and down following surgery, especially at night when you are not up moving around and exerting yourself.  The breathing machine keeps your lungs expanded and your temperature down.   DIET:  As you were doing prior to hospitalization, we recommend a well-balanced diet.  DRESSING / WOUND CARE / SHOWERING  Keep the surgical dressing until follow up.  The dressing is water proof, so you can shower without any extra covering.  IF THE DRESSING FALLS OFF or the wound gets wet inside, change the dressing with sterile gauze.  Please use good hand washing techniques before changing the dressing.  Do not use any lotions or creams on the incision until instructed by your surgeon.    ACTIVITY  o Increase activity slowly as tolerated, but follow the weight bearing instructions below.   o No driving for 6 weeks or until further direction given by your physician.  You cannot drive while taking narcotics.  o No lifting or carrying greater than 10 lbs. until further directed by your surgeon. o Avoid periods of inactivity such as sitting longer than an hour when not asleep. This helps prevent blood clots.  o You may return to work once you are authorized by your doctor.     WEIGHT BEARING   Weight bearing as tolerated with assist  device (walker, cane, etc) as directed, use it as long as suggested by your surgeon or therapist, typically at least 4-6 weeks.   EXERCISES  Results after joint replacement surgery are often greatly improved when you follow the exercise, range of motion and muscle strengthening exercises prescribed by your doctor. Safety measures are also important to protect the joint from further injury. Any time any of these exercises cause you to have increased pain or swelling, decrease what you are doing until you are comfortable again and then slowly increase them. If you have problems or questions, call your caregiver or physical therapist for advice.   Rehabilitation is important following a joint replacement. After just a few days of immobilization, the muscles of the leg can become weakened and shrink (atrophy).  These exercises are designed to build up the tone and strength of the thigh and leg muscles and to improve motion. Often times heat used for twenty to thirty minutes before working out will loosen up your tissues and help with improving the range of motion but do not use heat for the first two weeks following surgery (sometimes heat can increase post-operative swelling).   These exercises can be done on a training (exercise) mat, on the floor, on a table or on a bed. Use whatever works the best and is most comfortable for you.    Use music or television while you are exercising so that   the exercises are a pleasant break in your day. This will make your life better with the exercises acting as a break in your routine that you can look forward to.   Perform all exercises about fifteen times, three times per day or as directed.  You should exercise both the operative leg and the other leg as well.  Exercises include:   . Quad Sets - Tighten up the muscle on the front of the thigh (Quad) and hold for 5-10 seconds.   . Straight Leg Raises - With your knee straight (if you were given a brace, keep it on),  lift the leg to 60 degrees, hold for 3 seconds, and slowly lower the leg.  Perform this exercise against resistance later as your leg gets stronger.  . Leg Slides: Lying on your back, slowly slide your foot toward your buttocks, bending your knee up off the floor (only go as far as is comfortable). Then slowly slide your foot back down until your leg is flat on the floor again.  . Angel Wings: Lying on your back spread your legs to the side as far apart as you can without causing discomfort.  . Hamstring Strength:  Lying on your back, push your heel against the floor with your leg straight by tightening up the muscles of your buttocks.  Repeat, but this time bend your knee to a comfortable angle, and push your heel against the floor.  You may put a pillow under the heel to make it more comfortable if necessary.   A rehabilitation program following joint replacement surgery can speed recovery and prevent re-injury in the future due to weakened muscles. Contact your doctor or a physical therapist for more information on knee rehabilitation.    CONSTIPATION  Constipation is defined medically as fewer than three stools per week and severe constipation as less than one stool per week.  Even if you have a regular bowel pattern at home, your normal regimen is likely to be disrupted due to multiple reasons following surgery.  Combination of anesthesia, postoperative narcotics, change in appetite and fluid intake all can affect your bowels.   YOU MUST use at least one of the following options; they are listed in order of increasing strength to get the job done.  They are all available over the counter, and you may need to use some, POSSIBLY even all of these options:    Drink plenty of fluids (prune juice may be helpful) and high fiber foods Colace 100 mg by mouth twice a day  Senokot for constipation as directed and as needed Dulcolax (bisacodyl), take with full glass of water  Miralax (polyethylene glycol)  once or twice a day as needed.  If you have tried all these things and are unable to have a bowel movement in the first 3-4 days after surgery call either your surgeon or your primary doctor.    If you experience loose stools or diarrhea, hold the medications until you stool forms back up.  If your symptoms do not get better within 1 week or if they get worse, check with your doctor.  If you experience "the worst abdominal pain ever" or develop nausea or vomiting, please contact the office immediately for further recommendations for treatment.   ITCHING:  If you experience itching with your medications, try taking only a single pain pill, or even half a pain pill at a time.  You can also use Benadryl over the counter for itching or also to   help with sleep.   TED HOSE STOCKINGS:  Use stockings on both legs until for at least 2 weeks or as directed by physician office. They may be removed at night for sleeping.  MEDICATIONS:  See your medication summary on the "After Visit Summary" that nursing will review with you.  You may have some home medications which will be placed on hold until you complete the course of blood thinner medication.  It is important for you to complete the blood thinner medication as prescribed.  PRECAUTIONS:  If you experience chest pain or shortness of breath - call 911 immediately for transfer to the hospital emergency department.   If you develop a fever greater that 101 F, purulent drainage from wound, increased redness or drainage from wound, foul odor from the wound/dressing, or calf pain - CONTACT YOUR SURGEON.                                                   FOLLOW-UP APPOINTMENTS:  If you do not already have a post-op appointment, please call the office for an appointment to be seen by your surgeon.  Guidelines for how soon to be seen are listed in your "After Visit Summary", but are typically between 1-4 weeks after surgery.  OTHER INSTRUCTIONS:   Knee  Replacement:  Do not place pillow under knee, focus on keeping the knee straight while resting. CPM instructions: 0-90 degrees, 2 hours in the morning, 2 hours in the afternoon, and 2 hours in the evening. Place foam block, curve side up under heel at all times except when in CPM or when walking.  DO NOT modify, tear, cut, or change the foam block in any way.    MAKE SURE YOU:  . Understand these instructions.  . Get help right away if you are not doing well or get worse.    Thank you for letting us be a part of your medical care team.  It is a privilege we respect greatly.  We hope these instructions will help you stay on track for a fast and full recovery!     Dental Antibiotics:  In most cases prophylactic antibiotics for Dental procdeures after total joint surgery are not necessary.  Exceptions are as follows:  1. History of prior total joint infection  2. Severely immunocompromised (Organ Transplant, cancer chemotherapy, Rheumatoid biologic meds such as Humera)  3. Poorly controlled diabetes (A1C &gt; 8.0, blood glucose over 200)  If you have one of these conditions, contact your surgeon for an antibiotic prescription, prior to your dental procedure.

## 2021-03-14 NOTE — Evaluation (Signed)
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 PMH: gout, DM II, HTN, TIA (1994).   Clinical Impression   PTA, pt lives with spouse and reports Independence with ADLs, IADLs and mobility without AD. Pt does endorse furniture walking inside of the home due to R knee pain. Pt presents now with operative R LE pain (though pt reports lessened with pain meds) and minor deficits in dynamic standing balance secondary to pain. Educated pt on strategies for ADLs while healing with pt able to demo ADLs assessed with Modified Independence (and without AE). Pt able to reach R foot well. Pt Supervision for simple transfers due to cues needed for DME sequencing. Pt reports husband able to assist as needed at home and has all needed DME. No further skilled OT services needed at acute level or on DC. OT to sign off.    Follow Up Recommendations  No OT follow up;Supervision - Intermittent    Equipment Recommendations  None recommended by OT (appears well equipped)    Recommendations for Other Services       Precautions / Restrictions Precautions Precautions: Fall;Knee Required Braces or Orthoses: Knee Immobilizer - Right (has KI, but no specific orders on wearing schedule) Restrictions Weight Bearing Restrictions: Yes RLE Weight Bearing: Weight bearing as tolerated      Mobility Bed Mobility Overal bed mobility: Modified Independent                  Transfers Overall transfer level: Needs assistance Equipment used: Rolling walker (2 wheeled) Transfers: Sit to/from [employer] Sit to Stand: Supervision Stand pivot transfers: Supervision       General transfer comment: Supervision for sit to stand with RW,  cues for hand placement and sequencing taking steps to recliner. Pt noted to step outside RW when getting to chair and reach for armrests    Balance Overall balance assessment: Needs assistance Sitting-balance support: No upper extremity supported;Feet supported Sitting balance-Leahy Scale: Good     Standing balance support: Bilateral upper extremity supported;Single extremity supported;During functional activity Standing balance-Leahy Scale: Fair Standing balance comment: fair static standing, reliant on UE support with dynamic tasks to offload operative R LE                           ADL either performed or assessed with clinical judgement   ADL Overall ADL's : Modified independent                                       General ADL Comments: Pt able to perform UB/LB dressing without assist after initial education on strategies to maximize safety (performing seated, donning affected LE first, etc).     Vision Baseline Vision/History: Wears glasses Wears Glasses: At all times Patient Visual Report: No change from baseline Vision Assessment?: No apparent visual deficits     Perception     Praxis      Pertinent Vitals/Pain Pain Assessment: Faces Faces Pain Scale: Hurts a little bit Pain Location: R knee Pain Descriptors / Indicators: Discomfort;Grimacing Pain Intervention(s): Monitored during session;Premedicated before session     Hand Dominance Right  Extremity/Trunk Assessment Upper Extremity Assessment Upper Extremity Assessment: Overall WFL for tasks assessed   Lower Extremity Assessment Lower Extremity Assessment: Defer to PT evaluation   Cervical / Trunk Assessment Cervical / Trunk Assessment: Normal   Communication Communication Communication: No difficulties   Cognition Arousal/Alertness: Awake/alert Behavior During Therapy: WFL for tasks assessed/performed Overall Cognitive Status: Within Functional Limits for tasks  assessed                                     General Comments       Exercises     Shoulder Instructions      Home Living Family/patient expects to be discharged to:: Private residence Living Arrangements: Spouse/significant other Available Help at Discharge: Family;Available 24 hours/day Type of Home: House Home Access: Stairs to enter [employer] of Steps: 1 Entrance Stairs-Rails: None Home Layout: One level     Bathroom Shower/Tub: Chief Strategy Officer: Handicapped height (has BSC over toilet)     Home Equipment: Walker - 2 wheels;Cane - single point;Bedside commode;Shower seat;Shower seat - built in;Grab bars - tub/shower;Hand held shower head;Adaptive equipment Adaptive Equipment: Reacher        Prior Functioning/Environment Level of Independence: Independent        Comments: no use of AD for mobility though reports furniture walking and limping in the home due to R knee pain. Independent with ADLs, IADLs        OT Problem List: Decreased activity tolerance;Impaired balance (sitting and/or standing);Decreased knowledge of use of DME or AE;Pain      OT Treatment/Interventions:      OT Goals(Current goals can be found in the care plan section) Acute Rehab OT Goals Patient Stated Goal: go home today OT Goal Formulation: All assessment and education complete, DC therapy  OT Frequency:     Barriers to D/C:            Co-evaluation              AM-PAC OT "6 Clicks" Daily Activity     Outcome Measure Help from another person eating meals?: None Help from another person taking care of personal grooming?: None Help from another person toileting, which includes using toliet, bedpan, or urinal?: None Help from another person bathing (including washing, rinsing, drying)?: None Help from another person to put on and taking off regular upper body clothing?: None Help from another person to put on and taking off  regular lower body clothing?: None 6 Click Score: 24   End of Session Equipment Utilized During Treatment: Rolling walker Nurse Communication: Mobility status  Activity Tolerance: Patient tolerated treatment well Patient left: in chair;with call bell/phone within reach  OT Visit Diagnosis: Unsteadiness on feet (R26.81);Other abnormalities of gait and mobility (R26.89);Pain Pain - Right/Left: Right Pain - part of body: Knee                Time: 5188-4166 OT Time Calculation (min): 17 min Charges:  OT General Charges $OT Visit: 1 Visit OT Evaluation $OT Eval Low Complexity: 1 Low  Bradd Canary, OTR/L Acute Rehab Services Office: [phone]  Lorre Munroe 03/14/2021, 8:25 AM

## 2021-03-14 NOTE — Op Note (Signed)
NAME: Debra Madden, MCCLARD MEDICAL RECORD NO: [id number] ACCOUNT NO: [id number] DATE OF BIRTH: 03/29/1941 FACILITY: [facility] LOCATION: [facility] PHYSICIAN: Vanita Panda. Magnus Ivan, MD  Operative Report   DATE OF PROCEDURE: 03/13/2021  PREOPERATIVE DIAGNOSES:  Primary osteoarthritis and degenerative joint disease, right knee.  POSTOPERATIVE DIAGNOSES:  Primary osteoarthritis and degenerative joint disease, right knee.  PROCEDURE:  Cemented right total knee arthroplasty.  IMPLANTS:  A Stryker Triathlon knee system with size 3 femur, size 3 tibial tray, 10 mm fixed bearing polyethylene insert, size 29  central patellar button.  SURGEON:  Vanita Panda. Magnus Ivan, MD  ASSISTANT:  Richardean Canal, PA-C  ANESTHESIA: 1.  Right lower extremity adductor canal block. 2.  Spinal.  TOURNIQUET TIME:  Less than 1 hour.  ANTIBIOTICS:  1 gram IV vancomycin.  BLOOD LOSS:  Less than 100 mL.  COMPLICATIONS:  None.  INDICATIONS:  The patient is a very pleasant 80 year old female with worsening arthritis in her knee for over five years now.  She has a history of an arthroscopic intervention to that right knee about 11 years ago.  At this point, her arthritis pain is  daily and it is detrimentally affecting her mobility, her quality of life and activities of daily living.  Her x-rays show complete loss of joint space with the varus malalignment of that knee and osteophytes in all three compartments.  At this point, we  have recommended total knee replacement surgery.  We described in detail the risk of acute blood loss anemia, nerve and vessel injury, fracture, infection, DVT, implant failure and skin and soft tissue issues.  We talked about our goals being decrease  pain, improve mobility and overall improve quality of life.  DESCRIPTION OF PROCEDURE:  After informed consent was obtained, appropriate right knee was marked.  Anesthesia obtained in the right lower extremity, adductor canal block in the holding  room.  She was then brought to the operating room and sat up on the  operating table where spinal anesthesia was obtained. She was laid in the supine position on the operating table.  A Foley catheter was placed and a nonsterile tourniquet was placed around her upper right thigh.  Her right thigh, knee, leg, ankle and  foot were prepped and draped with DuraPrep and sterile drapes.  A timeout was called and she was identified as correct patient, correct right knee.  I then used the Esmarch to wrap out the leg and tourniquet inflated to 300 mm of pressure.  We then made  a direct midline incision over the patella and carried this proximally and distally.  We dissected down the knee joint and carried out a medial parapatellar arthrotomy, finding a large joint effusion and significant synovitis around the knee.  We found  complete denuding of the cartilage throughout the knee.  We removed osteophytes from all three compartments as well as remnants of ACL, PCL, medial and lateral meniscus.  With the knee in a flexed position, we used our extramedullary cutting guide for  taking 9 mm off the high side, correcting varus and valgus in a neutral slope.  We made our proximal tibia cut without difficulty.  We decided to take this 2 more mm down based on the thickness of our cut.  We then went to the femur and used an  intramedullary cutting guide for our femur, setting this for a right knee at 5 degrees externally rotated for an 8 mm distal femoral cut.  We then backed this, went down 2 more mm  as well.  We brought the knee back down to full extension and achieved  full extension with a 9 mm extension guide.  Attention was turned back to the femur.  We used a femoral sizing guide based off the epicondylar axis and Whitesides line.  Based off of this, we chose a size 3 femur.  We put a 4-in-1 cutting block for a  size 3 femur, made our anterior and posterior cuts followed by our chamfer cuts.  We then made our femoral  box cut.  Attention was then turned back to the tibia.  We choose the size 3 tibial tray for coverage, setting the rotation of the tibial tubercle  and the femur.  We made our keel punch off of this.  With a size 3 trial tibia, we trialed a 3 right femur and a 9 mm fixed bearing polyethylene insert.  We felt there was good range of motion and stability but thought we needed just maybe 1 more mm  thickness.  We then made our patellar cut and drilled three holes for size 29 central patellar button.  With all trial instrumentation in the knee, we put the knee through several cycles of motion and we were pleased with range of motion and stability.   We then removed all instrumentation from the knee and irrigated the knee with normal saline solution using pulsatile lavage.  We dried the knee real well and mixed our cement.  We then cemented our Stryker Triathlon tibial tray size 3 followed by our  size 3 right femur that we cemented. We cleaned the cement debris from the knee and then placed 10 mm fixed bearing polyethylene insert.  We cemented our patellar button and held that with a clamp.  With the knee fully extended and compression against  this, we let the cement harden.  Once it had hardened, we put the knee through several cycles of motion and removed some cement debris from the knee.  We then let the tourniquet down and hemostasis was obtained with electrocautery.  We closed the  arthrotomy with interrupted #1 Vicryl suture followed by 0 Vicryl to close the deep tissue and 2-0 Vicryl was used to close the subcutaneous tissue.  The skin was reapproximated with staples.  Well-padded sterile dressing was applied.  She was taken to  recovery room in stable condition with all final counts being correct and no complications noted.  Of note, Rexene Edison, PA-C, assisted during the entire case and his assistance was crucial for facilitating all aspects of this case.   SHW D: 03/13/2021 3:56:55 pm T: 03/14/2021  12:04:00 am  JOB: [id number]/ [id number]

## 2021-03-14 NOTE — Plan of Care (Signed)
  Problem: Safety: Goal: Ability to remain free from injury will improve Outcome: Adequate for Discharge   Problem: Education: Goal: Knowledge of the prescribed therapeutic regimen will improve Outcome: Adequate for Discharge Goal: Individualized Educational Video(s) Outcome: Adequate for Discharge   Problem: Activity: Goal: Ability to avoid complications of mobility impairment will improve Outcome: Adequate for Discharge Goal: Range of joint motion will improve Outcome: Adequate for Discharge   Problem: Clinical Measurements: Goal: Postoperative complications will be avoided or minimized Outcome: Adequate for Discharge

## 2021-03-14 NOTE — Progress Notes (Signed)
Subjective: 1 Day Post-Op Procedure(s) (LRB): RIGHT TOTAL KNEE ARTHROPLASTY (Right) Patient reports pain as moderate.    Objective: Vital signs in last 24 hours: Temp:  [97.1 F (36.2 C)-98.3 F (36.8 C)] 98.3 F (36.8 C) (05/25 0722) Pulse Rate:  [73-109] 106 (05/25 0722) Resp:  [11-22] 17 (05/25 0722) BP: (122-190)/(59-85) 149/75 (05/25 0722) SpO2:  [96 %-100 %] 96 % (05/25 0722)  Intake/Output from previous day: 05/24 0701 - 05/25 0700 In: 1400 [I.V.:1100; IV Piggyback:300] Out: 1050 [Urine:1000; Blood:50] Intake/Output this shift: No intake/output data recorded.  No results for input(s): HGB in the last 72 hours. No results for input(s): WBC, RBC, HCT, PLT in the last 72 hours. No results for input(s): NA, K, CL, CO2, BUN, CREATININE, GLUCOSE, CALCIUM in the last 72 hours. No results for input(s): LABPT, INR in the last 72 hours.  Sensation intact distally Intact pulses distally Dorsiflexion/Plantar flexion intact Incision: dressing C/D/I No cellulitis present Compartment soft   Assessment/Plan: 1 Day Post-Op Procedure(s) (LRB): RIGHT TOTAL KNEE ARTHROPLASTY (Right) Up with therapy Discharge home with home health      Kathryne Hitch 03/14/2021, 7:48 AM

## 2021-03-21 ENCOUNTER — Other Ambulatory Visit: Payer: Self-pay | Admitting: Orthopaedic Surgery

## 2021-03-26 ENCOUNTER — Encounter: Payer: Self-pay | Admitting: Orthopaedic Surgery

## 2021-03-26 ENCOUNTER — Ambulatory Visit (INDEPENDENT_AMBULATORY_CARE_PROVIDER_SITE_OTHER): Payer: Medicare HMO | Admitting: Orthopaedic Surgery

## 2021-03-26 ENCOUNTER — Other Ambulatory Visit: Payer: Self-pay

## 2021-03-26 DIAGNOSIS — Z96651 Presence of right artificial knee joint: Secondary | ICD-10-CM

## 2021-03-26 MED ORDER — METHOCARBAMOL 500 MG PO TABS: 500.0000 mg | ORAL_TABLET | Freq: Four times a day (QID) | ORAL | 1 refills | Status: DC | PRN

## 2021-03-26 MED ORDER — OXYCODONE HCL 5 MG PO TABS: 5.0000 mg | ORAL_TABLET | Freq: Four times a day (QID) | ORAL | 0 refills | Status: DC | PRN

## 2021-03-26 NOTE — Progress Notes (Signed)
The patient is 2 weeks status post a right total knee arthroplasty.  She is 80 years old and very active.  Her notes from therapy state that she lacks full extension by 5 degrees and has been able to flex to 105 degrees.  She is doing well overall.  She does need a refill of her pain medication and muscle relaxant.  She is interested in physical therapy here at [facility] upstairs.  She has been on 325 mg aspirin twice a day since she was on a once a day prior to surgery.  She can go back on it once a day.  Her right knee incision looks good today.  The staples are intact.  I will have our assistant remove the staples in place Steri-Strips.  Her calf is soft.  She has good flexion-extension of her foot on that right operative side.  She lacks full extension by about 5 degrees and I can flex her to just past 90 degrees on the right knee.  She will go down to just once a day aspirin that she was on prior to surgery.  We will work on setting her up for outpatient therapy.  I did refill her Robaxin and oxycodone.  I would like to see her back in 4 weeks to see how she is doing overall.  No x-rays are needed at that visit.  All questions and concerns were answered and addressed.

## 2021-03-27 ENCOUNTER — Other Ambulatory Visit: Payer: Self-pay

## 2021-03-27 DIAGNOSIS — Z96651 Presence of right artificial knee joint: Secondary | ICD-10-CM

## 2021-03-30 ENCOUNTER — Encounter: Payer: Self-pay | Admitting: Rehabilitative and Restorative Service Providers"

## 2021-03-30 ENCOUNTER — Other Ambulatory Visit: Payer: Self-pay

## 2021-03-30 ENCOUNTER — Ambulatory Visit (INDEPENDENT_AMBULATORY_CARE_PROVIDER_SITE_OTHER): Payer: Medicare HMO | Admitting: Rehabilitative and Restorative Service Providers"

## 2021-03-30 DIAGNOSIS — M6281 Muscle weakness (generalized): Secondary | ICD-10-CM | POA: Diagnosis not present

## 2021-03-30 DIAGNOSIS — M25661 Stiffness of right knee, not elsewhere classified: Secondary | ICD-10-CM

## 2021-03-30 DIAGNOSIS — R600 Localized edema: Secondary | ICD-10-CM

## 2021-03-30 DIAGNOSIS — R262 Difficulty in walking, not elsewhere classified: Secondary | ICD-10-CM

## 2021-03-30 DIAGNOSIS — M25561 Pain in right knee: Secondary | ICD-10-CM

## 2021-03-30 NOTE — Patient Instructions (Signed)
Access Code: [email or web address] URL: [email or web address] Date: 03/30/2021 Prepared by: Pauletta Browns  Exercises Supine Quadricep Sets - 3 x daily - 7 x weekly - 3-4 sets - 10 reps - 5 second hold Seated Knee Flexion AAROM - 3-5 x daily - 7 x weekly - 1 sets - 1 reps - 3 minutes hold

## 2021-03-30 NOTE — Therapy (Signed)
[facility] Physical Therapy [address] [place], [place], [address] Phone: [phone]   Fax:  [phone]  Physical Therapy Evaluation  Patient Details  Name: Debra Madden MRN: [id number] Date of Birth: 17-Mar-1941 Referring Provider (PT): Kathryne Hitch MD   Encounter Date: 03/30/2021   PT End of Session - 03/30/21 1636     Visit Number 1    Number of Visits 24    Date for PT Re-Evaluation 05/25/21    PT Start Time 1515    PT Stop Time 1600    PT Time Calculation (min) 45 min    Activity Tolerance Patient tolerated treatment well;Patient limited by pain    Behavior During Therapy [facility] for tasks assessed/performed             Past Medical History:  Diagnosis Date   Arthritis    DM (diabetes mellitus) (HCC)    type 2   Gout    Hypercalcemia    Hypercholesteremia    Hypertension    PONV (postoperative nausea and vomiting)    Rheumatoid arteritis (HCC)    Stroke (HCC)    TIA 1994    Past Surgical History:  Procedure Laterality Date   bladder prolapse  2018   Dr. Cranston Neighbor   carpal tunnel Bilateral 1998   CATARACT EXTRACTION Bilateral    DILATION AND CURETTAGE OF UTERUS  1964   Dr. Alease Medina   MENISCUS REPAIR Right 2010   Dr. Cranston Neighbor   PARTIAL HYSTERECTOMY  1973   ovaries remain   Stroke  1994   TIA   TONSILLECTOMY  1956   TOTAL KNEE ARTHROPLASTY Right 03/13/2021   Procedure: RIGHT TOTAL KNEE ARTHROPLASTY;  Surgeon: Kathryne Hitch, MD;  Location: [facility] OR;  Service: Orthopedics;  Laterality: Right;    There were no vitals filed for this visit.    Subjective Assessment - 03/30/21 1627     Subjective Debra Madden had a R TKA 03/13/2021.  Edema and gait quality are concerns.    Pertinent History Gout, Type 2 DM, HTN and TIA in 1994    Limitations House hold activities;Lifting;Standing;Walking    How long can you sit comfortably? Stiffness with standing from sitting    How long can you stand comfortably? <  10 minutes    How long can you walk comfortably? Household distances or a bit longer with a walker    Patient Stated Goals Reduce edema, pain and walk good enough to not need a walker    Currently in Pain? Yes    Pain Score 5     Pain Location Knee    Pain Orientation Right    Pain Descriptors / Indicators Tightness;Aching;Sore    Pain Type Chronic pain;Surgical pain    Pain Radiating Towards NA    Pain Onset 1 to 4 weeks ago    Pain Frequency Constant    Aggravating Factors  Too much WB and prolonged postures    Pain Relieving Factors Ice and pain medication    Effect of Pain on Daily Activities Very limited with WB function and sleep is affected    Multiple Pain Sites No                [facility] PT Assessment - 03/30/21 0001       Assessment   Medical Diagnosis s/p R TKA    Referring Provider (PT) Kathryne Hitch MD    Onset Date/Surgical Date 03/13/21    Next MD Visit April 25, 2021      Balance Screen   Has the patient fallen in the past 6 months No    Has the patient had a decrease in activity level because of a fear of falling?  No    Is the patient reluctant to leave their home because of a fear of falling?  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      Cognition   Overall Cognitive Status Within Functional Limits for tasks assessed      Observation/Other Assessments   Focus on Therapeutic Outcomes (FOTO)  40 (Goal 57)      ROM / Strength   AROM / PROM / Strength AROM;Strength      AROM   Overall AROM  Deficits    AROM Assessment Site Knee    Right/Left Knee Left;Right    Right Knee Extension -8    Right Knee Flexion 108    Left Knee Extension 0    Left Knee Flexion 130      Strength   Overall Strength Deficits    Strength Assessment Site Knee    Right/Left  Knee Left;Right    Right Knee Extension 2+/5    Left Knee Extension 4/5                        Objective measurements completed on examination: See above findings.       [facility] Adult PT Treatment/Exercise - 03/30/21 0001       Therapeutic Activites    Therapeutic Activities Other Therapeutic Activities    Other Therapeutic Activities Review exam findings, emphasis on extension AROM and strength and edema control      Exercises   Exercises Knee/Hip      Knee/Hip Exercises: Aerobic   Recumbent Bike Next visit      Knee/Hip Exercises: Machines for Strengthening   Total Gym Leg Press Next visit      Knee/Hip Exercises: Seated   Long Arc Quad --    Knee/Hip Flexion Tailgate knee flexion AROM 3 minutes      Knee/Hip Exercises: Supine   Quad Sets Strengthening;Both;2 sets;10 reps;Limitations    Quad Sets Limitations 5 seconds with ankle dorsiflexion      Modalities   Modalities Vasopneumatic      Vasopneumatic   Number Minutes Vasopneumatic  10 minutes    Vasopnuematic Location  Knee    Vasopneumatic Pressure Medium    Vasopneumatic Temperature  34                    PT Education - 03/30/21 1636     Education Details Reviewed exam findings and beginner HEP.    Person(s) Educated Patient    Methods Explanation;Demonstration;Tactile cues;Verbal cues;Handout    Comprehension Verbal cues required;Need further instruction;Returned demonstration;Verbalized understanding;Tactile cues required              PT Short Term Goals - 03/30/21 1641       PT SHORT TERM GOAL #1   Title Improve R knee AROM to -3 to 115 degress.    Baseline -8 to 108    Time 8    Period Weeks    Status New    Target Date 05/25/21  PT Long Term Goals - 03/30/21 1642       PT LONG TERM GOAL #1   Title Improve FOTO to 57.    Baseline 40    Time 8    Period Weeks    Status New    Target Date 05/25/21      PT LONG TERM GOAL #2   Title  Improve B quadriceps strength as assessed by AD free gait, functional scores and MMT.    Baseline 2+/5 MMT and needs walker outside the home    Time 8    Period Weeks    Status New    Target Date 05/25/21      PT LONG TERM GOAL #3   Title Debra Madden will be independent with her long-term HEP at [facility].    Time 8    Period Weeks    Status New    Target Date 05/25/21                    Plan - 03/30/21 1628     Clinical Impression Statement Debra Madden had a R TKA 03/13/2021.  AROM is -8 to 108 degrees today.  Emphasis will be on extension AROM and strength and edema control.  Flexion AROM, balance and gait will also be addressed along with steps and functional activities as AROM improves.    Personal Factors and Comorbidities Comorbidity 3+    Comorbidities Gout, Type 2 DM, HTN and TIA in 1994    Examination-Activity Limitations Bathing;Dressing;Transfers;Bed Mobility;Sleep;Squat;Lift;Bend;Locomotion Level;Stairs;Stand    Examination-Participation Restrictions Interpersonal Relationship;Community Activity;Driving    Stability/Clinical Decision Making Stable/Uncomplicated    Clinical Decision Making Low    Rehab Potential Good    PT Frequency Other (comment)   2-3X/week   PT Duration 8 weeks    PT Treatment/Interventions ADLs/Self Care Home Management;Cryotherapy;Electrical Stimulation;Therapeutic activities;Stair training;Gait training;Therapeutic exercise;Balance training;Neuromuscular re-education;Patient/family education;Manual techniques;Passive range of motion;Vasopneumatic Device    PT Next Visit Plan Review HEP, leg press emphasizing full extension, bike with seat back to reach into extension    PT Home Exercise Plan Access Code: [email or web address]    Consulted and Agree with Plan of Care Patient             Patient will benefit from skilled therapeutic intervention in order to improve the following deficits and impairments:  Abnormal gait, Decreased activity tolerance, Decreased  endurance, Decreased range of motion, Decreased strength, Difficulty walking, Increased edema, Impaired flexibility, Pain  Visit Diagnosis: Difficulty walking  Localized edema  Muscle weakness (generalized)  Stiffness of right knee, not elsewhere classified  Right knee pain, unspecified chronicity     Problem List Patient Active Problem List   Diagnosis Date Noted   Unilateral primary osteoarthritis, right knee 03/13/2021   Status post right knee replacement 03/13/2021   Chronic gout without tophus 03/17/2018   Primary osteoarthritis involving multiple joints 03/17/2018   High risk medication use 03/17/2018   Mixed hyperlipidemia 03/17/2018   SUI (stress urinary incontinence, female) 08/01/2017   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/31/2017   Left flank pain 10/09/2015   Type 2 diabetes mellitus without complication, without long-term current use of insulin (HCC) 10/09/2015   Essential hypertension with goal blood pressure less than 140/90 04/06/2015   Pure hypercholesterolemia 10/05/2014   Hypercalcemia 09/21/2013   ETD (eustachian tube dysfunction) 01/24/2012   Right carotid bruit 04/04/2011    Cherlyn Cushing PT, MPT 03/30/2021, 4:46 PM  [facility] Physical Therapy [address] [place], [place], [address] Phone:  [phone]   Fax:  [phone]  Name: Debra Madden MRN: [id number] Date of Birth: [birth year]

## 2021-04-03 ENCOUNTER — Ambulatory Visit (INDEPENDENT_AMBULATORY_CARE_PROVIDER_SITE_OTHER): Payer: Medicare HMO | Admitting: Physical Therapy

## 2021-04-03 ENCOUNTER — Other Ambulatory Visit: Payer: Self-pay

## 2021-04-03 ENCOUNTER — Encounter: Payer: Self-pay | Admitting: Physical Therapy

## 2021-04-03 DIAGNOSIS — M25661 Stiffness of right knee, not elsewhere classified: Secondary | ICD-10-CM | POA: Diagnosis not present

## 2021-04-03 DIAGNOSIS — R600 Localized edema: Secondary | ICD-10-CM

## 2021-04-03 DIAGNOSIS — R262 Difficulty in walking, not elsewhere classified: Secondary | ICD-10-CM | POA: Diagnosis not present

## 2021-04-03 DIAGNOSIS — M6281 Muscle weakness (generalized): Secondary | ICD-10-CM

## 2021-04-03 DIAGNOSIS — M25561 Pain in right knee: Secondary | ICD-10-CM

## 2021-04-03 NOTE — Therapy (Signed)
[facility] Physical Therapy [address] [place], [place], [address] Phone: [phone]   Fax:  [phone]  Physical Therapy Treatment   Patient Details  Name: Debra Madden MRN: [id number] Date of Birth: 11-16-1940 Referring Provider (PT): Kathryne Hitch MD   Encounter Date: 04/03/2021   PT End of Session - 04/03/21 1357     Visit Number 2    Number of Visits 24    Date for PT Re-Evaluation 05/25/21    Progress Note Due on Visit 10    PT Start Time 1340    PT Stop Time 1425    PT Time Calculation (min) 45 min    Equipment Utilized During Treatment Gait belt    Activity Tolerance Patient tolerated treatment well;Patient limited by pain    Behavior During Therapy WFL for tasks assessed/performed             Past Medical History:  Diagnosis Date   Arthritis    DM (diabetes mellitus) (HCC)    type 2   Gout    Hypercalcemia    Hypercholesteremia    Hypertension    PONV (postoperative nausea and vomiting)    Rheumatoid arteritis (HCC)    Stroke (HCC)    TIA 1994    Past Surgical History:  Procedure Laterality Date   bladder prolapse  2018   Dr. Cranston Neighbor   carpal tunnel Bilateral 1998   CATARACT EXTRACTION Bilateral    DILATION AND CURETTAGE OF UTERUS  1964   Dr. Alease Medina   MENISCUS REPAIR Right 2010   Dr. Cranston Neighbor   PARTIAL HYSTERECTOMY  1973   ovaries remain   Stroke  1994   TIA   TONSILLECTOMY  1956   TOTAL KNEE ARTHROPLASTY Right 03/13/2021   Procedure: RIGHT TOTAL KNEE ARTHROPLASTY;  Surgeon: Kathryne Hitch, MD;  Location: [facility] OR;  Service: Orthopedics;  Laterality: Right;    There were no vitals filed for this visit.   Subjective Assessment - 04/03/21 1353     Subjective Pt arriving today reporting 3/10 pain in Rt knee. Pt reporting more pain with transitioning from sit to stand.    Pertinent History Gout, Type 2 DM, HTN and TIA in 1994, s/p R TKA on 03/13/2021    Limitations House hold  activities;Lifting;Standing;Walking    How long can you stand comfortably? < 10 minutes    How long can you walk comfortably? Household distances or a bit longer with a walker    Patient Stated Goals Reduce edema, pain and walk good enough to not need a walker    Currently in Pain? Yes    Pain Score 3     Pain Location Knee    Pain Orientation Right    Pain Descriptors / Indicators Aching;Sore;Tightness    Pain Type Surgical pain    Pain Onset 1 to 4 weeks ago                [facility] PT Assessment - 04/03/21 0001       Assessment   Medical Diagnosis s/p R TKA    Referring Provider (PT) Kathryne Hitch MD    Onset Date/Surgical Date 03/13/21    Next MD Visit April 25, 2021      AROM   Right Knee Extension -6    Right Knee Flexion 105  [facility] Adult PT Treatment/Exercise - 04/03/21 0001       Ambulation/Gait   Gait Comments instructed pt in amb wtih straight cane in Left UE with proper sequencing. Pt instructed to bring her cane at her next visit if she feels comfortable.      Knee/Hip Exercises: Stretches   Active Hamstring Stretch Right;2 reps;30 seconds    Gastroc Stretch 3 reps;20 seconds    Gastroc Stretch Limitations slant board      Knee/Hip Exercises: Aerobic   Recumbent Bike partial revolutions progressing to full revolutions with seat at #5      Knee/Hip Exercises: Machines for Strengthening   Total Gym Leg Press 50# bilateral LE"s, 25# Rt LE only 2x10 each      Knee/Hip Exercises: Standing   Heel Raises Right;15 reps      Knee/Hip Exercises: Seated   Long Arc Quad Strengthening;AROM;Right;15 reps    Long Arc Quad Weight 3 lbs.    Knee/Hip Flexion Tailgate knee flexion AROM 3 minutes    Sit to Sand 10 reps;with UE support      Knee/Hip Exercises: Supine   Quad Sets Strengthening;Both;2 sets;10 reps;Limitations    Quad Sets Limitations 5 second hold with overpressure    Bridges Strengthening;Both;10 reps;2  sets    Straight Leg Raises Strengthening;Right;10 reps      Modalities   Modalities Vasopneumatic      Vasopneumatic   Number Minutes Vasopneumatic  10 minutes    Vasopnuematic Location  Knee    Vasopneumatic Pressure Medium    Vasopneumatic Temperature  34      Manual Therapy   Manual therapy comments PROM right knee fleion/extension                      PT Short Term Goals - 04/03/21 1421       PT SHORT TERM GOAL #1   Title Improve R knee AROM to -3 to 115 degress.    Time 8    Period Weeks    Status On-going               PT Long Term Goals - 04/03/21 1421       PT LONG TERM GOAL #1   Title Improve FOTO to 57.    Status On-going      PT LONG TERM GOAL #2   Title Improve B quadriceps strength as assessed by AD free gait, functional scores and MMT.    Status On-going      PT LONG TERM GOAL #3   Title Ziara will be independent with her long-term HEP at DC.    Status On-going                   Plan - 04/03/21 1358     Clinical Impression Statement Pt s/p Right TKA on 5/242022. Pt amb wtih RW into clinic, but reporting no device at home on level surfaces.  Gait training with straight cane performed for pt to begin transitioning. Pt tolerating exercises well.  Pt encouraged to continue to ice and elevate. Continue skilled PT to progress to maximize function.    Personal Factors and Comorbidities Comorbidity 3+    Comorbidities Gout, Type 2 DM, HTN and TIA in 1994    Examination-Activity Limitations Bathing;Dressing;Transfers;Bed Mobility;Sleep;Squat;Lift;Bend;Locomotion Level;Stairs;Stand    Examination-Participation Restrictions Interpersonal Relationship;Community Activity;Driving    Stability/Clinical Decision Making Stable/Uncomplicated    Rehab Potential Good    PT Duration 8 weeks    PT  Treatment/Interventions ADLs/Self Care Home Management;Cryotherapy;Electrical Stimulation;Therapeutic activities;Stair training;Gait  training;Therapeutic exercise;Balance training;Neuromuscular re-education;Patient/family education;Manual techniques;Passive range of motion;Vasopneumatic Device    PT Next Visit Plan bike, Leg press, gait training with straight cane, ROM and strengthening Rt knee and vaso as needed for swelling and pain    PT Home Exercise Plan Access Code: [email or web address]    Consulted and Agree with Plan of Care Patient             Patient will benefit from skilled therapeutic intervention in order to improve the following deficits and impairments:  Abnormal gait, Decreased activity tolerance, Decreased endurance, Decreased range of motion, Decreased strength, Difficulty walking, Increased edema, Impaired flexibility, Pain  Visit Diagnosis: Difficulty walking  Localized edema  Muscle weakness (generalized)  Stiffness of right knee, not elsewhere classified  Right knee pain, unspecified chronicity     Problem List Patient Active Problem List   Diagnosis Date Noted   Unilateral primary osteoarthritis, right knee 03/13/2021   Status post right knee replacement 03/13/2021   Chronic gout without tophus 03/17/2018   Primary osteoarthritis involving multiple joints 03/17/2018   High risk medication use 03/17/2018   Mixed hyperlipidemia 03/17/2018   SUI (stress urinary incontinence, female) 08/01/2017   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/31/2017   Left flank pain 10/09/2015   Type 2 diabetes mellitus without complication, without long-term current use of insulin (HCC) 10/09/2015   Essential hypertension with goal blood pressure less than 140/90 04/06/2015   Pure hypercholesterolemia 10/05/2014   Hypercalcemia 09/21/2013   ETD (eustachian tube dysfunction) 01/24/2012   Right carotid bruit 04/04/2011    Sharmon Leyden, PT, MPT 04/03/2021, 2:26 PM  [facility] Physical Therapy [address] [place], [place], [address] Phone: [phone]   Fax:  [phone]  Name:  SRINIDHI LANDERS MRN: [id number] Date of Birth: 01-17-1941

## 2021-04-06 ENCOUNTER — Ambulatory Visit (INDEPENDENT_AMBULATORY_CARE_PROVIDER_SITE_OTHER): Payer: Medicare HMO | Admitting: Rehabilitative and Restorative Service Providers"

## 2021-04-06 ENCOUNTER — Other Ambulatory Visit: Payer: Self-pay

## 2021-04-06 ENCOUNTER — Encounter: Payer: Self-pay | Admitting: Rehabilitative and Restorative Service Providers"

## 2021-04-06 DIAGNOSIS — M25661 Stiffness of right knee, not elsewhere classified: Secondary | ICD-10-CM

## 2021-04-06 DIAGNOSIS — M6281 Muscle weakness (generalized): Secondary | ICD-10-CM | POA: Diagnosis not present

## 2021-04-06 DIAGNOSIS — M25561 Pain in right knee: Secondary | ICD-10-CM

## 2021-04-06 DIAGNOSIS — R262 Difficulty in walking, not elsewhere classified: Secondary | ICD-10-CM | POA: Diagnosis not present

## 2021-04-06 DIAGNOSIS — R600 Localized edema: Secondary | ICD-10-CM

## 2021-04-06 NOTE — Therapy (Signed)
[facility] Physical Therapy [address] [place], [place], [address] Phone: [phone]   Fax:  [phone]  Physical Therapy Treatment  Patient Details  Name: Debra Madden MRN: [id number] Date of Birth: April 06, 1941 Referring Provider (PT): Kathryne Hitch MD   Encounter Date: 04/06/2021   PT End of Session - 04/06/21 1334     Visit Number 3    Number of Visits 24    Date for PT Re-Evaluation 05/25/21    Progress Note Due on Visit 10    PT Start Time 1302    PT Stop Time 1357    PT Time Calculation (min) 55 min    Activity Tolerance Patient tolerated treatment well    Behavior During Therapy [facility] for tasks assessed/performed             Past Medical History:  Diagnosis Date   Arthritis    DM (diabetes mellitus) (HCC)    type 2   Gout    Hypercalcemia    Hypercholesteremia    Hypertension    PONV (postoperative nausea and vomiting)    Rheumatoid arteritis (HCC)    Stroke (HCC)    TIA 1994    Past Surgical History:  Procedure Laterality Date   bladder prolapse  2018   Dr. Cranston Neighbor   carpal tunnel Bilateral 1998   CATARACT EXTRACTION Bilateral    DILATION AND CURETTAGE OF UTERUS  1964   Dr. Alease Medina   MENISCUS REPAIR Right 2010   Dr. Cranston Neighbor   PARTIAL HYSTERECTOMY  1973   ovaries remain   Stroke  1994   TIA   TONSILLECTOMY  1956   TOTAL KNEE ARTHROPLASTY Right 03/13/2021   Procedure: RIGHT TOTAL KNEE ARTHROPLASTY;  Surgeon: Kathryne Hitch, MD;  [facility];  Service: Orthopedics;  Laterality: Right;    There were no vitals filed for this visit.   Subjective Assessment - 04/06/21 1313     Subjective Debra Madden reports inconsistent HEP compliance.  Debra Madden has been doing more around the house.    Pertinent History Gout, Type 2 DM, HTN and TIA in 1994, s/p R TKA on 03/13/2021    Limitations House hold activities;Lifting;Standing;Walking    How long can you stand comfortably? Over an hour (was < 10  minutes)    How long can you walk comfortably? Household distances or a bit longer with a walker    Patient Stated Goals Reduce edema, pain and walk good enough to not need a walker    Currently in Pain? Yes    Pain Score 3     Pain Location Knee    Pain Orientation Right    Pain Descriptors / Indicators Aching;Tightness;Sore    Pain Type Surgical pain    Pain Radiating Towards NA    Pain Onset 1 to 4 weeks ago    Pain Frequency Constant    Aggravating Factors  Too much WB and prolonged postures    Pain Relieving Factors Ice and tylenol during the day, 1 oxy and a muscle relaxer before bed    Effect of Pain on Daily Activities Limited WB function, uses a cane with gait, needs meds to sleep    Multiple Pain Sites No                [facility] PT Assessment - 04/06/21 0001       AROM   Right Knee Extension -6    Right Knee Flexion 108  [facility] Adult PT Treatment/Exercise - 04/06/21 0001       Exercises   Exercises Knee/Hip      Knee/Hip Exercises: Machines for Strengthening   Total Gym Leg Press 50# double leg full extension to slow eccentrics with flexion 2 sets of 15 50# and single leg B 15X each at 25#      Knee/Hip Exercises: Seated   Knee/Hip Flexion Tailgate knee flexion AROM 3 minutes      Knee/Hip Exercises: Supine   Quad Sets Strengthening;Both;2 sets;10 reps;Limitations    Quad Sets Limitations 5 second hold with dorsiflexion      Modalities   Modalities Vasopneumatic      Vasopneumatic   Number Minutes Vasopneumatic  10 minutes    Vasopnuematic Location  Knee    Vasopneumatic Pressure Medium    Vasopneumatic Temperature  34                    PT Education - 04/06/21 1319     Education Details Reviewed HEP with emphasis on consistent compliance    Person(s) Educated Patient    Methods Explanation;Demonstration;Tactile cues;Verbal cues    Comprehension Verbalized understanding;Tactile cues  required;Returned demonstration;Need further instruction;Verbal cues required              PT Short Term Goals - 04/06/21 1332       PT SHORT TERM GOAL #1   Title Improve R knee AROM to -3 to 115 degress.    Baseline -6 and 108 degrees    Time 4    Period Weeks    Status On-going               PT Long Term Goals - 04/06/21 1333       PT LONG TERM GOAL #1   Title Improve FOTO to 57.    Baseline 40    Time 8    Period Weeks    Status On-going    Target Date 05/25/21      PT LONG TERM GOAL #2   Title Improve B quadriceps strength as assessed by AD free gait, functional scores and MMT.    Status On-going      PT LONG TERM GOAL #3   Title Debra Madden will be independent with her long-term HEP at [place].    Time 8    Period Weeks    Status On-going    Target Date 05/25/21                   Plan - 04/06/21 1338     Clinical Impression Statement Debra Madden reports less than optimal HEP compliance.  We reviewed the importance of consistent HEP compliance to meet LTGs.  Adjusted her cane and Debra Madden looks good with the cane although the walker is preferred in the community.  Continue POC.    Personal Factors and Comorbidities Comorbidity 3+    Comorbidities Gout, Type 2 DM, HTN and TIA in 1994    Examination-Activity Limitations Bathing;Dressing;Transfers;Bed Mobility;Sleep;Squat;Lift;Bend;Locomotion Level;Stairs;Stand    Examination-Participation Restrictions Interpersonal Relationship;Community Activity;Driving    Stability/Clinical Decision Making Stable/Uncomplicated    Rehab Potential Good    PT Duration 8 weeks    PT Treatment/Interventions ADLs/Self Care Home Management;Cryotherapy;Electrical Stimulation;Therapeutic activities;Stair training;Gait training;Therapeutic exercise;Balance training;Neuromuscular re-education;Patient/family education;Manual techniques;Passive range of motion;Vasopneumatic Device    PT Next Visit Plan Knee extension AROM and strength,  balance, flexion as time allows (108 today)    PT Home Exercise Plan Access Code: [email or web address]    Consulted  and Agree with Plan of Care Patient             Patient will benefit from skilled therapeutic intervention in order to improve the following deficits and impairments:  Abnormal gait, Decreased activity tolerance, Decreased endurance, Decreased range of motion, Decreased strength, Difficulty walking, Increased edema, Impaired flexibility, Pain  Visit Diagnosis: Difficulty walking  Localized edema  Muscle weakness (generalized)  Stiffness of right knee, not elsewhere classified  Right knee pain, unspecified chronicity     Problem List Patient Active Problem List   Diagnosis Date Noted   Unilateral primary osteoarthritis, right knee 03/13/2021   Status post right knee replacement 03/13/2021   Chronic gout without tophus 03/17/2018   Primary osteoarthritis involving multiple joints 03/17/2018   High risk medication use 03/17/2018   Mixed hyperlipidemia 03/17/2018   SUI (stress urinary incontinence, female) 08/01/2017   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/31/2017   Left flank pain 10/09/2015   Type 2 diabetes mellitus without complication, without long-term current use of insulin (HCC) 10/09/2015   Essential hypertension with goal blood pressure less than 140/90 04/06/2015   Pure hypercholesterolemia 10/05/2014   Hypercalcemia 09/21/2013   ETD (eustachian tube dysfunction) 01/24/2012   Right carotid bruit 04/04/2011    Cherlyn Cushing PT, MPT 04/06/2021, 1:41 PM  [facility] Physical Therapy [address] [place], [place], [address] Phone: [phone]   Fax:  [phone]  Name: Debra Madden MRN: [id number] Date of Birth: 1941-06-11

## 2021-04-11 ENCOUNTER — Ambulatory Visit (INDEPENDENT_AMBULATORY_CARE_PROVIDER_SITE_OTHER): Payer: Medicare HMO | Admitting: Rehabilitative and Restorative Service Providers"

## 2021-04-11 ENCOUNTER — Other Ambulatory Visit: Payer: Self-pay

## 2021-04-11 ENCOUNTER — Encounter: Payer: Self-pay | Admitting: Rehabilitative and Restorative Service Providers"

## 2021-04-11 DIAGNOSIS — M6281 Muscle weakness (generalized): Secondary | ICD-10-CM

## 2021-04-11 DIAGNOSIS — M25561 Pain in right knee: Secondary | ICD-10-CM

## 2021-04-11 DIAGNOSIS — R262 Difficulty in walking, not elsewhere classified: Secondary | ICD-10-CM

## 2021-04-11 DIAGNOSIS — R600 Localized edema: Secondary | ICD-10-CM | POA: Diagnosis not present

## 2021-04-11 DIAGNOSIS — M25661 Stiffness of right knee, not elsewhere classified: Secondary | ICD-10-CM

## 2021-04-11 NOTE — Patient Instructions (Signed)
Stick with same HEP

## 2021-04-11 NOTE — Therapy (Signed)
[facility] [place], [place], [address] Phone: [phone]   Fax:  [phone]  Physical Therapy Treatment  Patient Details  Name: Debra Madden MRN: [id number] Date of Birth: [birth year] Referring Provider (PT): Mcarthur Rossetti MD   Encounter Date: 04/11/2021   PT End of Session - 04/11/21 1430     Visit Number 4    Number of Visits 24    Date for PT Re-Evaluation 05/25/21    Progress Note Due on Visit 10    PT Start Time 0488    PT Stop Time 1430    PT Time Calculation (min) 45 min    Activity Tolerance Patient tolerated treatment well;No increased pain;Patient limited by fatigue    Behavior During Therapy [facility] for tasks assessed/performed             Past Medical History:  Diagnosis Date   Arthritis    DM (diabetes mellitus) ([place])    type 2   Gout    Hypercalcemia    Hypercholesteremia    Hypertension    PONV (postoperative nausea and vomiting)    Rheumatoid arteritis ([place])    Stroke ([place])    TIA 1994    Past Surgical History:  Procedure Laterality Date   bladder prolapse  2018   Dr. Ladean Raya   carpal tunnel Bilateral 1998   CATARACT EXTRACTION Bilateral    DILATION AND CURETTAGE OF UTERUS  1964   Dr. Leonia Reader   MENISCUS REPAIR Right 2010   Dr. Ladean Raya   PARTIAL HYSTERECTOMY  1973   ovaries remain   Stroke  1994   TIA   [place] Right 03/13/2021   Procedure: RIGHT TOTAL KNEE ARTHROPLASTY;  Surgeon: Mcarthur Rossetti, MD;  Location: [place];  Service: Orthopedics;  Laterality: Right;    There were no vitals filed for this visit.   Subjective Assessment - 04/11/21 1351     Subjective Falon reports improving HEP compliance (60 quadriceps sets)    Pertinent History Gout, Type 2 DM, HTN and TIA in 1994, s/p R TKA on 03/13/2021    Limitations House hold activities;Lifting;Standing;Walking    How long can you sit comfortably? Stiffness  with standing from sitting    How long can you stand comfortably? Over an hour (was < 10 minutes)    How long can you walk comfortably? Household distances or a bit longer with a walker    Patient Stated Goals Reduce edema, pain and walk good enough to not need a walker    Currently in Pain? Yes    Pain Score 0-No pain    Pain Location Knee    Pain Orientation Right    Pain Descriptors / Indicators Tightness    Pain Type Surgical pain    Pain Radiating Towards NA    Pain Onset 1 to 4 weeks ago    Pain Frequency Intermittent    Aggravating Factors  Too much WB and prolonged postures    Pain Relieving Factors Ice and tylenol during the day, oxy and a muscle relaxer at night    Effect of Pain on Daily Activities Limited WB function, uses a walker or cane with gait, needs meds to sleep    Multiple Pain Sites No                [facility] PT Assessment - 04/11/21 0001       AROM   Right Knee Extension -  5    Right Knee Flexion 115                           [facility] Adult PT Treatment/Exercise - 04/11/21 0001       Therapeutic Activites    Therapeutic Activities Other Therapeutic Activities    Other Therapeutic Activities Step-up and over 2 and 4 inch step focus on knee flexion and slow eccentrics      Neuro Re-ed    Neuro Re-ed Details  Tandem balance 5X 20 seconds      Exercises   Exercises Knee/Hip      Knee/Hip Exercises: Machines for Strengthening   Total Gym Leg Press 56# double leg full extension to slow eccentrics with flexion 15X and single leg B 15X each at 31#      Knee/Hip Exercises: Seated   Knee/Hip Flexion Tailgate knee flexion AROM 1 minutes      Knee/Hip Exercises: Supine   Quad Sets Strengthening;Both;2 sets;10 reps;Limitations    Quad Sets Limitations 5 second hold      Modalities   Modalities --      Vasopneumatic   Number Minutes Vasopneumatic  --    Vasopnuematic Location  --    Vasopneumatic Pressure --    Vasopneumatic  Temperature  --                    PT Education - 04/11/21 1428     Education Details Progressed balance and steps/stairs.  Stick with same HEP.    Person(s) Educated Patient    Methods Explanation;Demonstration;Tactile cues;Verbal cues    Comprehension Returned demonstration;Need further instruction;Verbalized understanding;Verbal cues required;Tactile cues required              PT Short Term Goals - 04/11/21 1429       PT SHORT TERM GOAL #1   Title Improve R knee AROM to -3 to 115 degress.    Baseline -5 and 115 degrees    Time 4    Period Weeks    Status Partially Met    Target Date 05/25/21               PT Long Term Goals - 04/11/21 1429       PT LONG TERM GOAL #1   Title Improve FOTO to 57.    Baseline 40    Time 8    Period Weeks    Status On-going    Target Date 05/25/21      PT LONG TERM GOAL #2   Title Improve B quadriceps strength as assessed by AD free gait, functional scores and MMT.    Baseline 2+/5 MMT and needs walker outside the home    Time 8    Period Weeks    Status On-going    Target Date 05/25/21      PT LONG TERM GOAL #3   Title Loreta will be independent with her long-term HEP at DC.    Time 8    Period Weeks    Status On-going    Target Date 05/25/21                   Plan - 04/11/21 1430     Clinical Impression Statement Danyele continues to make objective progress towards long-term goals.  Flexion AROM is excellent (115 degrees) while extension is improved at -5 degrees.  HEP and clinic program are focused on extension AROM, strength, proprioception, edema  control and function.  RA next visit.    Personal Factors and Comorbidities Comorbidity 3+    Comorbidities Gout, Type 2 DM, HTN and TIA in 1994    Examination-Activity Limitations Bathing;Dressing;Transfers;Bed Mobility;Sleep;Squat;Lift;Bend;Locomotion Level;Stairs;Stand    Examination-Participation Restrictions Interpersonal Relationship;Community  Activity;Driving    Stability/Clinical Decision Making Stable/Uncomplicated    Rehab Potential Good    PT Frequency Other (comment)    PT Duration 8 weeks    PT Treatment/Interventions ADLs/Self Care Home Management;Cryotherapy;Electrical Stimulation;Therapeutic activities;Stair training;Gait training;Therapeutic exercise;Balance training;Neuromuscular re-education;Patient/family education;Manual techniques;Passive range of motion;Vasopneumatic Device    PT Next Visit Plan Knee extension AROM and strength, balance, flexion as time allows (115 today)    PT Home Exercise Plan Access Code: [email or web address]    Consulted and Agree with Plan of Care Patient             Patient will benefit from skilled therapeutic intervention in order to improve the following deficits and impairments:  Abnormal gait, Decreased activity tolerance, Decreased endurance, Decreased range of motion, Decreased strength, Difficulty walking, Increased edema, Impaired flexibility, Pain  Visit Diagnosis: Difficulty walking  Muscle weakness (generalized)  Localized edema  Stiffness of right knee, not elsewhere classified  Right knee pain, unspecified chronicity     Problem List Patient Active Problem List   Diagnosis Date Noted   Unilateral primary osteoarthritis, right knee 03/13/2021   Status post right knee replacement 03/13/2021   Chronic gout without tophus 03/17/2018   Primary osteoarthritis involving multiple joints 03/17/2018   High risk medication use 03/17/2018   Mixed hyperlipidemia 03/17/2018   SUI (stress urinary incontinence, female) 08/01/2017   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/31/2017   Left flank pain 10/09/2015   Type 2 diabetes mellitus without complication, without long-term current use of insulin ([place]) 10/09/2015   Essential hypertension with goal blood pressure less than 140/90 04/06/2015   Pure hypercholesterolemia 10/05/2014   Hypercalcemia 09/21/2013   ETD (eustachian tube  dysfunction) 01/24/2012   Right carotid bruit 04/04/2011    Farley Ly PT, MPT 04/11/2021, 2:45 PM  [facility] Physical Therapy [address] [place], [place], [address] Phone: [phone]   Fax:  [phone]  Name: TERSEA AULDS MRN: [id number] Date of Birth: 17-Apr-1941

## 2021-04-13 ENCOUNTER — Encounter: Payer: Medicare HMO | Admitting: Rehabilitative and Restorative Service Providers"

## 2021-04-14 ENCOUNTER — Other Ambulatory Visit: Payer: Self-pay | Admitting: Nurse Practitioner

## 2021-04-20 ENCOUNTER — Ambulatory Visit (INDEPENDENT_AMBULATORY_CARE_PROVIDER_SITE_OTHER): Payer: Medicare HMO | Admitting: Rehabilitative and Restorative Service Providers"

## 2021-04-20 ENCOUNTER — Encounter: Payer: Self-pay | Admitting: Rehabilitative and Restorative Service Providers"

## 2021-04-20 ENCOUNTER — Other Ambulatory Visit: Payer: Self-pay

## 2021-04-20 DIAGNOSIS — M6281 Muscle weakness (generalized): Secondary | ICD-10-CM

## 2021-04-20 DIAGNOSIS — R262 Difficulty in walking, not elsewhere classified: Secondary | ICD-10-CM

## 2021-04-20 DIAGNOSIS — R600 Localized edema: Secondary | ICD-10-CM

## 2021-04-20 DIAGNOSIS — M25661 Stiffness of right knee, not elsewhere classified: Secondary | ICD-10-CM | POA: Diagnosis not present

## 2021-04-20 NOTE — Patient Instructions (Signed)
Access Code: [email or web address] URL: [email or web address] Date: 04/20/2021 Prepared by: Pauletta Browns  Exercises Small Range Straight Leg Raise - 2 x daily - 7 x weekly - 3-5 sets - 5 reps - 3 seconds hold Sit to Stand with Armchair - 2 x daily - 7 x weekly - 1 sets - 5 reps Tandem Stance - 1-2 x daily - 7 x weekly - 1 sets - 5 reps - 20 second hold

## 2021-04-20 NOTE — Therapy (Signed)
[facility] [address] [place], [place], [address] Phone: [phone]   Fax:  [phone]  Physical Therapy Treatment/DC  Patient Details  Name: Debra Madden MRN: [id number] Date of Birth: May 31, 1941 Referring Provider (PT): Mcarthur Rossetti MD  PHYSICAL THERAPY DISCHARGE SUMMARY  Visits from [facility] of Care: 5  Current functional level related to goals / functional outcomes: Very good, see note   Remaining deficits: Strength, see note   Education / Equipment: Updated HEP   Patient agrees to discharge. Patient goals were met. Patient is being discharged due to being pleased with the current functional level.  Encounter Date: 04/20/2021   PT End of Session - 04/20/21 1654     Visit Number 5    Number of Visits 24    Date for PT Re-Evaluation 05/25/21    Progress Note Due on Visit 10    PT Start Time 1301    PT Stop Time 1345    PT Time Calculation (min) 44 min    Activity Tolerance Patient tolerated treatment well;No increased pain    Behavior During Therapy WFL for tasks assessed/performed             Past Medical History:  Diagnosis Date   Arthritis    DM (diabetes mellitus) ([place])    type 2   Gout    Hypercalcemia    Hypercholesteremia    Hypertension    PONV (postoperative nausea and vomiting)    Rheumatoid arteritis ([place])    Stroke ([place])    TIA 1994    Past Surgical History:  Procedure Laterality Date   bladder prolapse  2018   Dr. Ladean Raya   carpal tunnel Bilateral 1998   CATARACT EXTRACTION Bilateral    DILATION AND CURETTAGE OF UTERUS  1964   Dr. Leonia Reader   MENISCUS REPAIR Right 2010   Dr. Ladean Raya   PARTIAL HYSTERECTOMY  1973   ovaries remain   Stroke  1994   TIA   [place] Right 03/13/2021   Procedure: RIGHT TOTAL KNEE ARTHROPLASTY;  Surgeon: Mcarthur Rossetti, MD;  Location: [facility];  Service: Orthopedics;  Laterality: Right;     There were no vitals filed for this visit.   Subjective Assessment - 04/20/21 1305     Subjective Sleeping is limited to 5-6 hours/night.  Otherwise, Debra Madden is pleased with her progress.    Pertinent History Gout, Type 2 DM, HTN and TIA in 1994, s/p R TKA on 03/13/2021    Limitations House hold activities;Lifting;Standing;Walking    How long can you sit comfortably? 1-2 hours, stiffness with standing from sitting    How long can you stand comfortably? Up to 5 hours (was < 10 minutes)    How long can you walk comfortably? Uses cane intermittently, slower with ADLs    Patient Stated Goals Reduce edema, pain and walk good enough to not need a walker    Currently in Pain? No/denies    Pain Location Knee    Pain Orientation Right    Pain Descriptors / Indicators Tightness    Pain Radiating Towards NA    Pain Onset More than a month ago    Pain Frequency Occasional    Aggravating Factors  Too much WB    Pain Relieving Factors Ice, tylenol, occasional stronger pain meds with overuse    Effect of Pain on Daily Activities Uses a cane for longer walks outside and needs meds at  night PRN    Multiple Pain Sites No                [facility] PT Assessment - 04/20/21 0001       AROM   Right Knee Extension -3    Right Knee Flexion 118    Left Knee Extension 0    Left Knee Flexion 132      Strength   Overall Strength Deficits    Right/Left Knee Left;Right    Right Knee Extension 3+/5    Left Knee Extension 4+/5                           [facility] Adult PT Treatment/Exercise - 04/20/21 0001       Neuro Re-ed    Neuro Re-ed Details  Tandem balance 5X 20 seconds      Exercises   Exercises Knee/Hip      Knee/Hip Exercises: Machines for Strengthening   Total Gym Leg Press 56# double leg full extension to slow eccentrics with flexion 15X and single leg B 15X each at 31#      Knee/Hip Exercises: Seated   Long Arc Quad Strengthening;Both;3 sets;5 reps;Limitations     Long Arc Quad Limitations Seated straight leg raises    Knee/Hip Flexion Tailgate knee flexion AROM 1 minutes    Sit to [employer] 10 reps;without UE support;Other (comment)   slow eccentrics     Knee/Hip Exercises: Supine   Quad Sets Strengthening;Both;2 sets;10 reps;Limitations    Quad Sets Limitations 5 second hold                    PT Education - 04/20/21 1652     Education Details Reviewed exam findings and changes to HEP.    Person(s) Educated Patient    Methods Explanation;Demonstration;Verbal cues;Handout    Comprehension Verbal cues required;Returned demonstration;Need further instruction;Verbalized understanding              PT Short Term Goals - 04/20/21 1652       PT SHORT TERM GOAL #1   Title Improve R knee AROM to -3 to 115 degress.    Baseline -3 and 118 degrees    Time 4    Period Weeks    Status Achieved    Target Date 05/25/21               PT Long Term Goals - 04/20/21 1652       PT LONG TERM GOAL #1   Title Improve FOTO to 57.    Baseline 60 (was 40)    Time 8    Period Weeks    Status Achieved      PT LONG TERM GOAL #2   Title Improve B quadriceps strength as assessed by AD free gait, functional scores and MMT.    Baseline 3+/5 MMT and cane outside the home (was 2+/5 MMT and walker outside the home)    Time 8    Period Weeks    Status Partially Met      PT LONG TERM GOAL #3   Title Giannamarie will be independent with her long-term HEP at DC.    Time 8    Period Weeks    Status Achieved                   Plan - 04/20/21 1655     Clinical Impression Statement Maurissa has met most LTGs.  AROM is  excellent.  Strength still needs work and her HEP is addressing this.  Kimyata was given the option of continued PT for strength, balance and gait work or transfer into independent PT.  She chooses to continue PT independently at this time due to other responsibilities.    Personal Factors and Comorbidities Comorbidity 3+     Comorbidities Gout, Type 2 DM, HTN and TIA in 1994    Examination-Activity Limitations Bathing;Dressing;Transfers;Bed Mobility;Sleep;Squat;Lift;Bend;Locomotion Level;Stairs;Stand    Examination-Participation Restrictions Interpersonal Relationship;Community Activity;Driving    Stability/Clinical Decision Making Stable/Uncomplicated    Rehab Potential Good    PT Frequency Other (comment)    PT Duration Other (comment)    PT Treatment/Interventions ADLs/Self Care Home Management;Cryotherapy;Electrical Stimulation;Therapeutic activities;Stair training;Gait training;Therapeutic exercise;Balance training;Neuromuscular re-education;Patient/family education;Manual techniques;Passive range of motion;Vasopneumatic Device    PT Next Visit Plan DC with updated HEP today    PT Home Exercise Plan Access Code: [email or web address]    Consulted and Agree with Plan of Care Patient             Patient will benefit from skilled therapeutic intervention in order to improve the following deficits and impairments:  Abnormal gait, Decreased activity tolerance, Decreased endurance, Decreased range of motion, Decreased strength, Difficulty walking, Increased edema, Impaired flexibility, Pain  Visit Diagnosis: Difficulty walking  Muscle weakness (generalized)  Localized edema  Stiffness of right knee, not elsewhere classified     Problem List Patient Active Problem List   Diagnosis Date Noted   Unilateral primary osteoarthritis, right knee 03/13/2021   Status post right knee replacement 03/13/2021   Chronic gout without tophus 03/17/2018   Primary osteoarthritis involving multiple joints 03/17/2018   High risk medication use 03/17/2018   Mixed hyperlipidemia 03/17/2018   SUI (stress urinary incontinence, female) 08/01/2017   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/31/2017   Left flank pain 10/09/2015   Type 2 diabetes mellitus without complication, without long-term current use of insulin ([place])  10/09/2015   Essential hypertension with goal blood pressure less than 140/90 04/06/2015   Pure hypercholesterolemia 10/05/2014   Hypercalcemia 09/21/2013   ETD (eustachian tube dysfunction) 01/24/2012   Right carotid bruit 04/04/2011    Farley Ly PT, MPT 04/20/2021, 4:58 PM  [facility] [address] [place], [place], [address] Phone: [phone]   Fax:  [phone]  Name: YOLINDA DUERR MRN: [id number] Date of Birth: [birth year]

## 2021-04-25 ENCOUNTER — Ambulatory Visit (INDEPENDENT_AMBULATORY_CARE_PROVIDER_SITE_OTHER): Payer: Medicare HMO | Admitting: Orthopaedic Surgery

## 2021-04-25 ENCOUNTER — Other Ambulatory Visit: Payer: Self-pay

## 2021-04-25 ENCOUNTER — Encounter: Payer: Self-pay | Admitting: Orthopaedic Surgery

## 2021-04-25 DIAGNOSIS — Z96651 Presence of right artificial knee joint: Secondary | ICD-10-CM

## 2021-04-25 NOTE — Progress Notes (Signed)
The patient comes in today 6-week status post a right total knee arthroplasty.  She turned 80 recently.  She says she is doing great and has no issues with her knee at all.  She feels like she can stop physical therapy as well.  She lacks full extension by just a few degrees of the right knee.  There is a little swelling her incision looks good.  Her flexion is entirely full.  The knee feels ligamentously stable.  She will continue increase activity as comfort allows.  She can swim from my standpoint.  I will see her back in 6 months with a standing AP and lateral of the right knee.  If there are issues before then she will let us know.  All questions and concerns were answered and addressed.

## 2021-05-07 ENCOUNTER — Telehealth: Payer: Self-pay

## 2021-05-07 NOTE — Telephone Encounter (Signed)
Patient's dental office called. They requested a note be faxed to them with Dr.Blackman's protocol. It was faxed to [phone].

## 2021-05-10 ENCOUNTER — Encounter: Payer: Self-pay | Admitting: Nurse Practitioner

## 2021-05-11 ENCOUNTER — Encounter: Payer: Self-pay | Admitting: Nurse Practitioner

## 2021-05-16 ENCOUNTER — Encounter: Payer: Self-pay | Admitting: Nurse Practitioner

## 2021-05-16 ENCOUNTER — Other Ambulatory Visit: Payer: Self-pay

## 2021-05-16 ENCOUNTER — Ambulatory Visit (INDEPENDENT_AMBULATORY_CARE_PROVIDER_SITE_OTHER): Payer: Medicare HMO | Admitting: Nurse Practitioner

## 2021-05-16 VITALS — BP 144/86 | HR 106 | Temp 96.9°F | Ht 62.0 in | Wt 159.0 lb

## 2021-05-16 DIAGNOSIS — E559 Vitamin D deficiency, unspecified: Secondary | ICD-10-CM

## 2021-05-16 DIAGNOSIS — R300 Dysuria: Secondary | ICD-10-CM

## 2021-05-16 DIAGNOSIS — M1711 Unilateral primary osteoarthritis, right knee: Secondary | ICD-10-CM

## 2021-05-16 DIAGNOSIS — M1A9XX0 Chronic gout, unspecified, without tophus (tophi): Secondary | ICD-10-CM

## 2021-05-16 DIAGNOSIS — E119 Type 2 diabetes mellitus without complications: Secondary | ICD-10-CM | POA: Diagnosis not present

## 2021-05-16 DIAGNOSIS — I10 Essential (primary) hypertension: Secondary | ICD-10-CM

## 2021-05-16 LAB — POCT URINALYSIS DIPSTICK
Bilirubin, UA: NEGATIVE
Blood, UA: NEGATIVE
Glucose, UA: NEGATIVE
Ketones, UA: NEGATIVE
Nitrite, UA: NEGATIVE
Protein, UA: NEGATIVE
Spec Grav, UA: 1.01 (ref 1.010–1.025)
Urobilinogen, UA: 0.2 E.U./dL
pH, UA: 7 (ref 5.0–8.0)

## 2021-05-16 MED ORDER — HYDROCHLOROTHIAZIDE 12.5 MG PO TABS: 12.5000 mg | ORAL_TABLET | Freq: Every day | ORAL | Status: DC

## 2021-05-16 NOTE — Progress Notes (Signed)
Careteam: Patient Care Team: Sharon Seller, NP as PCP - General (Geriatric Medicine)  PLACE OF SERVICE:  [facility]  Advanced Directive information    Allergies  Allergen Reactions   Bactrim [Sulfamethoxazole-Trimethoprim] Diarrhea   Percocet [Oxycodone-Acetaminophen] Nausea And Vomiting   Clindamycin/Lincomycin Rash and Diarrhea    Terrible stomach cramps per patient   Penicillins Rash, Hives and Swelling    Chief Complaint  Patient presents with   Medical Management of Chronic Issues    6 month follow-up. Discuss need for shingrix or exclude. Foot exam today. Possible UTI, patient c/o pain when urinating.      HPI: Patient is a 80 y.o. female for routine follow up.   Recent knee replacement by Dr Magnus Ivan. Doing well post op- pain controlled. Good mobility. Not using assistive device.   129/76 at 8 am and pulse was 80.   Hyperlipidemia- continues on lipitor.   DM- a1c at goal on last labs. No hypoglycemia.  No neuropathy.   Reports she started burning over the last 2 days.  Does not think she is drinking enough water.  Take diuretic so increase in urination is at baseline.   Gout- [place] at the beach. Ate shrimp. On diuretic.    Review of Systems:  Review of Systems  Constitutional:  Negative for chills, fever and weight loss.  HENT:  Negative for tinnitus.   Respiratory:  Negative for cough, sputum production and shortness of breath.   Cardiovascular:  Negative for chest pain, palpitations and leg swelling.  Gastrointestinal:  Negative for abdominal pain, constipation, diarrhea and heartburn.  Genitourinary:  Negative for dysuria, frequency and urgency.  Musculoskeletal:  Negative for back pain, falls, joint pain and myalgias.  Skin: Negative.   Neurological:  Negative for dizziness and headaches.  Psychiatric/Behavioral:  Negative for depression and memory loss. The patient does not have insomnia.    Past Medical History:  Diagnosis Date    Arthritis    DM (diabetes mellitus) ([facility])    type 2   Gout    Hypercalcemia    Hypercholesteremia    Hypertension    PONV (postoperative nausea and vomiting)    Rheumatoid arteritis ([facility])    Stroke [facility])    TIA 1994   Past Surgical History:  Procedure Laterality Date   bladder prolapse  2018   Dr. Cranston Neighbor   carpal tunnel Bilateral 1998   CATARACT EXTRACTION Bilateral 10/2020   DILATION AND CURETTAGE OF UTERUS  1964   Dr. Alease Medina   MENISCUS REPAIR Right 2010   Dr. 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 Her smoking use included cigarettes. She has never used smokeless tobacco. She reports current alcohol use of about 1.0 - 2.0 standard drink of alcohol per week. She reports that she does not use drugs.  Family History  Problem Relation Age of Onset   Hypertension Son    Hypertension Son    Hypertension Son     Medications: Patient's Medications  New Prescriptions   No medications on file  Previous Medications   ACETAMINOPHEN (TYLENOL) 500 MG TABLET    Take 1,000 mg by  mouth every 8 (eight) hours as needed for moderate pain.   ALLOPURINOL (ZYLOPRIM) 300 MG TABLET    TAKE 1 TABLET DAILY   AMLODIPINE (NORVASC) 5 MG TABLET    TAKE 1 TABLET DAILY   ATORVASTATIN (LIPITOR) 40 MG TABLET    TAKE 1 TABLET DAILY   BIOTIN 16109 MCG TABS    Take 10,000 mcg by mouth daily at 12 noon.   CHOLECALCIFEROL (VITAMIN D3) 50 MCG (2000 UT) CAPSULE    Take 2,000 Units by mouth daily.   [employer] ASPIRIN EC 325 MG EC TABLET    TAKE 1 TABLET (325 MG TOTAL) BY MOUTH 2 (TWO) TIMES DAILY AFTER A MEAL.   FOLIC ACID (FOLVITE) 800 MCG TABLET    Take 800 mcg by mouth daily.    GLUCOSE  BLOOD (ONETOUCH VERIO) TEST STRIP    USE TO TEST BLOOD SUGAR    ONCE DAILY   HYDROCHLOROTHIAZIDE (HYDRODIURIL) 25 MG TABLET    TAKE 1 TABLET DAILY   LANCETS (ONETOUCH DELICA PLUS LANCET33G) MISC    USE TO TEST BLOOD SUGAR    ONCE DAILY.   LATANOPROST (XALATAN) 0.005 % OPHTHALMIC SOLUTION    Place 1 drop into both eyes at bedtime.   LOSARTAN (COZAAR) 100 MG TABLET    TAKE 1 TABLET BY MOUTH EVERY DAY   MENTHOL (ICY HOT) 5 % PTCH    Apply 1 patch topically daily as needed (pain).   METFORMIN (GLUCOPHAGE) 500 MG TABLET    TAKE 1 TABLET DAILY  Modified Medications   No medications on file  Discontinued Medications   METHOCARBAMOL (ROBAXIN) 500 MG TABLET    Take 1 tablet (500 mg total) by mouth every 6 (six) hours as needed for muscle spasms.   ONDANSETRON (ZOFRAN ODT) 4 MG DISINTEGRATING TABLET    Take 1 tablet (4 mg total) by mouth every 8 (eight) hours as needed for nausea or vomiting.   OXYCODONE (OXY IR/ROXICODONE) 5 MG IMMEDIATE RELEASE TABLET    Take 1-2 tablets (5-10 mg total) by mouth every 6 (six) hours as needed for moderate pain (pain score 4-6).    Physical Exam:  Vitals:   05/16/21 1034  BP: (!) 144/86  Pulse: (!) 106  Temp: (!) 96.9 F (36.1 C)  TempSrc: Temporal  SpO2: 99%  Weight: 159 lb (72.1 kg)  Height:  (1.575 m)   Body mass index is 29.08 kg/m. Wt Readings from Last 3 Encounters:  05/16/21 159 lb (72.1 kg)  03/09/21 163 lb 2 oz (74 kg)  01/24/21 165 lb (74.8 kg)    Physical Exam Constitutional:      General: She is not in acute distress.    Appearance: She is well-developed. She is not diaphoretic.  HENT:     Head: Normocephalic and atraumatic.     Mouth/Throat:     Pharynx: No oropharyngeal exudate.  Eyes:     Conjunctiva/sclera: Conjunctivae normal.     Pupils: Pupils are equal, round, and reactive to light.  Cardiovascular:     Rate and Rhythm: Normal rate and regular rhythm.     Heart sounds: Normal heart sounds.  Pulmonary:     Effort:  Pulmonary effort is normal.     Breath sounds: Normal breath sounds.  Abdominal:     General: Bowel sounds are normal.     Palpations: Abdomen is soft.  Musculoskeletal:     Cervical back: Normal range of motion and neck supple.     Right lower leg: No  edema.     Left lower leg: No edema.  Skin:    General: Skin is warm and dry.  Neurological:     Mental Status: She is alert and oriented to person, place, and time.  Psychiatric:        Mood and Affect: Mood normal.    Labs reviewed: Basic Metabolic Panel: Recent Labs    11/15/20 0000 03/09/21 1400 03/14/21 0636  NA 138 138 132*  K 3.9 4.4 3.0*  CL 102 103 95*  CO2 25 26 25   GLUCOSE 100* 115* 181*  BUN 18 19 7*  CREATININE 0.73 0.69 0.66  CALCIUM 11.1* 10.8* 9.9   Liver Function Tests: Recent Labs    11/15/20 0000  AST 19  ALT 14  BILITOT 1.0  PROT 7.1   No results for input(s): LIPASE, AMYLASE in the last 8760 hours. No results for input(s): AMMONIA in the last 8760 hours. CBC: Recent Labs    11/15/20 0000 03/09/21 1400 03/14/21 0636  WBC 9.4 9.0 14.8*  NEUTROABS 6,580  --   --   HGB 15.9* 14.9 13.5  HCT 46.2* 44.7 40.3  MCV 93.9 97.4 95.3  PLT 291 311 287   Lipid Panel: Recent Labs    11/15/20 0000  CHOL 176  HDL 65  LDLCALC 86  TRIG 156*  CHOLHDL 2.7   TSH: No results for input(s): TSH in the last 8760 hours. A1C: Lab Results  Component Value Date   HGBA1C 6.0 (H) 03/09/2021     Assessment/Plan 1. Dysuria -to increase hydration.  - POC Urinalysis Dipstick - Culture, Urine  2. Hypercalcemia -follow up lab today.  3. Vitamin D deficiency Continue on supplement.   4. Type 2 diabetes mellitus without complication, without long-term current use of insulin ([facility]CC) A1c at goal. Continues on metformin 500 mg by mouth daily. No hypoglycemia. Encouraged dietary compliance, routine foot care/monitoring and to keep up with diabetic eye exams through ophthalmology   5. Essential  hypertension with goal blood pressure less than 140/90 -home blood pressure at goal. Pt with white coat hypertension.  -will decrease HCTZ due to hx of gout flares and hypercalemia at this time. She will continue to monitor BP at home and notify if over goal - COMPLETE METABOLIC PANEL WITH GFR - CBC with Differential/Platelet  6. Primary osteoarthritis of right knee S/p total knee doing well.  7. Chronic gout without tophus, unspecified cause, unspecified site Continues on allopurinol. Recommend low purine diet.  - Uric Acid   Next appt: 6 month.  03/11/2021. Janene Harvey  [facility] [phone]

## 2021-05-16 NOTE — Telephone Encounter (Signed)
error 

## 2021-05-16 NOTE — Patient Instructions (Signed)
Decrease hydrochlorothiazide to 12.5 mg by mouth daily  Continue to monitor BP- to notify if blood pressure staying over 140/90 after you have had medication   REMEMBER low sodium diet.

## 2021-05-17 LAB — URIC ACID: Uric Acid, Serum: 3.1 mg/dL (ref 2.5–7.0)

## 2021-05-17 LAB — URINE CULTURE
MICRO NUMBER:: 12171601
SPECIMEN QUALITY:: ADEQUATE

## 2021-05-17 LAB — COMPLETE METABOLIC PANEL WITH GFR
AG Ratio: 2.2 (calc) (ref 1.0–2.5)
ALT: 16 U/L (ref 6–29)
AST: 17 U/L (ref 10–35)
Albumin: 4.6 g/dL (ref 3.6–5.1)
Alkaline phosphatase (APISO): 91 U/L (ref 37–153)
BUN/Creatinine Ratio: 23 (calc) — ABNORMAL HIGH (ref 6–22)
BUN: 13 mg/dL (ref 7–25)
CO2: 25 mmol/L (ref 20–32)
Calcium: 11.3 mg/dL — ABNORMAL HIGH (ref 8.6–10.4)
Chloride: 104 mmol/L (ref 98–110)
Creat: 0.56 mg/dL — ABNORMAL LOW (ref 0.60–0.95)
Globulin: 2.1 g/dL (calc) (ref 1.9–3.7)
Glucose, Bld: 95 mg/dL (ref 65–99)
Potassium: 4 mmol/L (ref 3.5–5.3)
Sodium: 140 mmol/L (ref 135–146)
Total Bilirubin: 0.9 mg/dL (ref 0.2–1.2)
Total Protein: 6.7 g/dL (ref 6.1–8.1)
eGFR: 92 mL/min/{1.73_m2} (ref >=60)

## 2021-05-17 LAB — CBC WITH DIFFERENTIAL/PLATELET
Absolute Monocytes: 420 cells/uL (ref 200–950)
Basophils Absolute: 77 cells/uL (ref 0–200)
Basophils Relative: 1.1 %
Eosinophils Absolute: 133 cells/uL (ref 15–500)
Eosinophils Relative: 1.9 %
HCT: 42.1 % (ref 35.0–45.0)
Hemoglobin: 13.6 g/dL (ref 11.7–15.5)
Lymphs Abs: 2247 cells/uL (ref 850–3900)
MCH: 30.8 pg (ref 27.0–33.0)
MCHC: 32.3 g/dL (ref 32.0–36.0)
MCV: 95.5 fL (ref 80.0–100.0)
MPV: 9 fL (ref 7.5–12.5)
Monocytes Relative: 6 %
Neutro Abs: 4123 cells/uL (ref 1500–7800)
Neutrophils Relative %: 58.9 %
Platelets: 323 10*3/uL (ref 140–400)
RBC: 4.41 10*6/uL (ref 3.80–5.10)
RDW: 12.6 % (ref 11.0–15.0)
Total Lymphocyte: 32.1 %
WBC: 7 10*3/uL (ref 3.8–10.8)

## 2021-06-24 ENCOUNTER — Other Ambulatory Visit: Payer: Self-pay | Admitting: Nurse Practitioner

## 2021-06-24 DIAGNOSIS — I10 Essential (primary) hypertension: Secondary | ICD-10-CM

## 2021-06-26 ENCOUNTER — Encounter: Payer: Self-pay | Admitting: Nurse Practitioner

## 2021-07-13 LAB — HM DIABETES EYE EXAM

## 2021-07-16 ENCOUNTER — Encounter: Payer: Self-pay | Admitting: *Deleted

## 2021-07-30 ENCOUNTER — Other Ambulatory Visit: Payer: Self-pay | Admitting: Nurse Practitioner

## 2021-07-30 DIAGNOSIS — I10 Essential (primary) hypertension: Secondary | ICD-10-CM

## 2021-08-22 ENCOUNTER — Encounter: Payer: Self-pay | Admitting: Nurse Practitioner

## 2021-10-05 ENCOUNTER — Telehealth: Payer: Self-pay | Admitting: Orthopaedic Surgery

## 2021-10-05 ENCOUNTER — Encounter: Payer: Medicare HMO | Admitting: Nurse Practitioner

## 2021-10-05 NOTE — Telephone Encounter (Signed)
Called to r/s with blackman

## 2021-10-06 ENCOUNTER — Other Ambulatory Visit: Payer: Self-pay | Admitting: Nurse Practitioner

## 2021-10-09 ENCOUNTER — Telehealth: Payer: Self-pay

## 2021-10-09 ENCOUNTER — Encounter: Payer: Self-pay | Admitting: Nurse Practitioner

## 2021-10-09 ENCOUNTER — Ambulatory Visit (INDEPENDENT_AMBULATORY_CARE_PROVIDER_SITE_OTHER): Payer: Medicare HMO | Admitting: Nurse Practitioner

## 2021-10-09 ENCOUNTER — Other Ambulatory Visit: Payer: Self-pay

## 2021-10-09 DIAGNOSIS — Z0000 Encounter for general adult medical examination without abnormal findings: Secondary | ICD-10-CM | POA: Diagnosis not present

## 2021-10-09 NOTE — Progress Notes (Signed)
This service is provided via telemedicine  No vital signs collected/recorded due to the encounter was a telemedicine visit.   Location of patient (ex: home, work):  Home  Patient consents to a telephone visit:  Yes, see encounter dated 10/09/2021  Location of the provider (ex: office, home):  [facility]  Name of any referring provider:  N/A  Names of all persons participating in the telemedicine service and their role in the encounter:  Abbey Chatters, Nurse Practitioner, Elveria Royals, CMA, and patient.   Time spent on call:  11 minutes with medical assistant

## 2021-10-09 NOTE — Telephone Encounter (Signed)
Ms. yeny, schmoll are scheduled for a virtual visit with your provider today.    Just as we do with appointments in the office, we must obtain your consent to participate.  Your consent will be active for this visit and any virtual visit you may have with one of our providers in the next 365 days.    If you have a MyChart account, I can also send a copy of this consent to you electronically.  All virtual visits are billed to your insurance company just like a traditional visit in the office.  As this is a virtual visit, video technology does not allow for your provider to perform a traditional examination.  This may limit your provider's ability to fully assess your condition.  If your provider identifies any concerns that need to be evaluated in person or the need to arrange testing such as labs, EKG, etc, we will make arrangements to do so.    Although advances in technology are sophisticated, we cannot ensure that it will always work on either your end or our end.  If the connection with a video visit is poor, we may have to switch to a telephone visit.  With either a video or telephone visit, we are not always able to ensure that we have a secure connection.   I need to obtain your verbal consent now.   Are you willing to proceed with your visit today?   Taniah A Felan has provided verbal consent on 10/09/2021 for a virtual visit (video or telephone).   Elveria Royals, [facility] 10/09/2021  10:47 AM

## 2021-10-09 NOTE — Progress Notes (Signed)
Subjective:   Debra Madden is a 80 y.o. female who presents for Medicare Annual (Subsequent) preventive examination.  Review of Systems     Cardiac Risk Factors include: advanced age (>59men, >56 women);family history of premature cardiovascular disease;diabetes mellitus;hypertension;dyslipidemia     Objective:    There were no vitals filed for this visit. There is no height or weight on file to calculate BMI.  Advanced Directives 10/09/2021 03/13/2021 03/09/2021 10/03/2020 05/15/2020 11/17/2019 09/27/2019  Does Patient Have a Medical Advance Directive? Yes No No Yes Yes Yes Yes  Type of Diplomatic Services operational officer - - Healthcare Power of [place];Living will;Out of facility DNR (pink MOST or yellow form) Healthcare Power of [employer] Power of [employer] Power of Attorney  Does patient want to make changes to medical advance directive? No - Patient declined - - No - Patient declined No - Patient declined No - Patient declined No - Patient declined  Copy of Healthcare Power of Attorney in Chart? Yes - validated most recent copy scanned in chart (See row information) - - Yes - validated most recent copy scanned in chart (See row information) Yes - validated most recent copy scanned in chart (See row information) Yes - validated most recent copy scanned in chart (See row information) Yes - validated most recent copy scanned in chart (See row information)  Would patient like information on creating a medical advance directive? - No - Patient declined No - Patient declined - - - -  Pre-existing out of facility DNR order (yellow form or pink MOST form) - - - Yellow form placed in chart (order not valid for inpatient use) - - -    Current Medications (verified) Outpatient Encounter Medications as of 10/09/2021  Medication Sig   acetaminophen (TYLENOL) 500 MG tablet Take 1,000 mg by mouth every 8 (eight) hours as needed for moderate pain.   allopurinol  (ZYLOPRIM) 300 MG tablet TAKE 1 TABLET DAILY   amLODipine (NORVASC) 5 MG tablet TAKE 1 TABLET DAILY   atorvastatin (LIPITOR) 40 MG tablet TAKE 1 TABLET DAILY   Biotin 10932 MCG TABS Take 10,000 mcg by mouth daily at 12 noon.   Cholecalciferol (VITAMIN D3) 50 MCG (2000 UT) capsule Take 2,000 Units by mouth daily.   [employer] ASPIRIN EC 325 MG EC tablet TAKE 1 TABLET (325 MG TOTAL) BY MOUTH 2 (TWO) TIMES DAILY AFTER A MEAL.   folic acid (FOLVITE) 800 MCG tablet Take 800 mcg by mouth daily.    glucose blood (ONETOUCH VERIO) test strip USE TO TEST BLOOD SUGAR    ONCE DAILY   hydrochlorothiazide (HYDRODIURIL) 12.5 MG tablet Take 1 tablet (12.5 mg total) by mouth daily.   Lancets (ONETOUCH DELICA PLUS LANCET33G) MISC USE TO TEST BLOOD SUGAR    ONCE DAILY.   latanoprost (XALATAN) 0.005 % ophthalmic solution Place 1 drop into both eyes at bedtime.   losartan (COZAAR) 100 MG tablet TAKE 1 TABLET BY MOUTH EVERY DAY   metFORMIN (GLUCOPHAGE) 500 MG tablet TAKE 1 TABLET DAILY   [DISCONTINUED] Menthol (ICY HOT) 5 % PTCH Apply 1 patch topically daily as needed (pain).   No facility-administered encounter medications on file as of 10/09/2021.    Allergies (verified) Bactrim [sulfamethoxazole-trimethoprim], Percocet [oxycodone-acetaminophen], Clindamycin/lincomycin, and Penicillins   History: Past Medical History:  Diagnosis Date   Arthritis    DM (diabetes mellitus) (HCC)    type 2   Gout    Hypercalcemia    Hypercholesteremia  Hypertension    PONV (postoperative nausea and vomiting)    Rheumatoid arteritis (HCC)    Stroke [facility])    TIA 1994   Past Surgical History:  Procedure Laterality Date   bladder prolapse  2018   Dr. Cranston Neighbor   carpal tunnel Bilateral 1998   CATARACT EXTRACTION Bilateral 10/2020   DILATION AND CURETTAGE OF UTERUS  1964   Dr. Alease Medina   MENISCUS REPAIR Right 2010   Dr. 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   Procedure: RIGHT TOTAL KNEE ARTHROPLASTY;  Surgeon: Kathryne Hitch, MD;  Location: [facility] OR;  Service: Orthopedics;  Laterality: Right;   Family History  Problem Relation Age of Onset   Hypertension Son    Hypertension Son    Hypertension Son    Social History   Socioeconomic History   Marital status: Married    Spouse name: Not on file   Number of children: Not on file   Years of education: Not on file   Highest education level: Not on file  Occupational History   Not on file  Tobacco Use   Smoking status: Former    Types: Cigarettes   Smokeless tobacco: Never   Tobacco comments:    Quit at age 62  Vaping Use   Vaping Use: Never used  Substance and Sexual Activity   Alcohol use: Yes    Alcohol/week: 1.0 - 2.0 standard drink    Types: 1 - 2 Glasses of wine per week   Drug use: Never   Sexual activity: Not Currently  Other Topics Concern   Not on file  Social History Narrative   Social History      Diet? Meats, fish, salads, occasionally shellfish, eggs, vegs, broc, cauliflower, brusel sprouts, green beans, mixed vegs, potatoes, some pasta/rice.       Do you drink/eat things with caffeine? Coffee- 1 1/2 cup per day      Marital status?           married                         What year were you married? 1964      Do you live in a house, apartment, assisted living, condo, trailer, etc.? house      Is it one or more stories? One- no stairs      How many persons live in your home? 2      Do you have any pets in your home? (please list) no      Highest level of education completed? 4 year college degree      Current or past profession: teacher      Do you exercise?         yes                             Type & how often? Walk, water aerobics      Advanced Directives      Do you have a living will? yes      Do you have a DNR form?        no  If not, do you want to  discuss one?      Do you have signed POA/HPOA for forms? yes      Functional Status      Do you have difficulty bathing or dressing yourself? no      Do you have difficulty preparing food or eating? no      Do you have difficulty managing your medications? no      Do you have difficulty managing your finances? no      Do you have difficulty affording your medications? no   Social Determinants of Health   Financial Resource Strain: Not on file  Food Insecurity: Not on file  Transportation Needs: Not on file  Physical Activity: Not on file  Stress: Not on file  Social Connections: Not on file    Tobacco Counseling Counseling given: Not Answered Tobacco comments: Quit at age 28   Clinical Intake:  Pre-visit preparation completed: Yes  Pain : No/denies pain     BMI - recorded: 29 Nutritional Status: BMI 25 -29 Overweight Nutritional Risks: None Diabetes: Yes  How often do you need to have someone help you when you read instructions, pamphlets, or other written materials from your doctor or pharmacy?: 1 - Never  Diabetic?no         Activities of Daily Living In your present state of health, do you have any difficulty performing the following activities: 10/09/2021 03/13/2021  Hearing? N N  Vision? N N  Difficulty concentrating or making decisions? N N  Walking or climbing stairs? N Y  Dressing or bathing? N Y  Doing errands, shopping? N N  Preparing Food and eating ? N -  Using the Toilet? N -  In the past six months, have you accidently leaked urine? Y -  Do you have problems with loss of bowel control? N -  Managing your Medications? N -  Managing your Finances? N -  Housekeeping or managing your Housekeeping? N -  Some recent data might be hidden    Patient Care Team: Sharon Seller, NP as PCP - General (Geriatric Medicine)  Indicate any recent Medical Services you may have received from other than [facility] providers in the past year (date may be  approximate).     Assessment:   This is a routine wellness examination for Kharma.  Hearing/Vision screen Hearing Screening - Comments:: No hearing problems. Vision Screening - Comments:: Patient wears reading glasses. Patient had eye exam in past year. Patient sees Dr. Cathey Endow  Dietary issues and exercise activities discussed: Current Exercise Habits: The patient does not participate in regular exercise at present   Goals Addressed   None    Depression Screen PHQ 2/9 Scores 10/09/2021 10/03/2020 05/15/2020 11/17/2019 09/27/2019 09/23/2018 03/17/2018  PHQ - 2 Score 0 0 0 0 0 0 0    Fall Risk Fall Risk  10/09/2021 05/16/2021 11/15/2020 10/03/2020 05/15/2020  Falls in the past year? 0 0 0 0 0  Number falls in past yr: 0 0 0 0 0  Injury with Fall? 0 0 0 0 0  Risk for fall due to : No Fall Risks No Fall Risks - - -  Follow up Falls evaluation completed Falls evaluation completed - - -    FALL RISK PREVENTION PERTAINING TO THE HOME:  Any stairs in or around the home? No  If so, are there any without handrails? No  Home free of loose throw rugs in walkways, pet beds, electrical cords, etc? Yes  Adequate lighting in your home to reduce risk of falls? Yes   ASSISTIVE DEVICES UTILIZED TO PREVENT FALLS:  Life alert? No  Use of a cane, walker or w/c? No  Grab bars in the bathroom? Yes  Shower chair or bench in shower? Yes  Elevated toilet seat or a handicapped toilet? Yes   TIMED UP AND GO:  Was the test performed? No .    Cognitive Function: MMSE - Mini Mental State Exam 09/23/2018  Orientation to time 5  Orientation to Place 5  Registration 3  Attention/ Calculation 5  Recall 2  Language- name 2 objects 2  Language- repeat 1  Language- follow 3 step command 3  Language- read & follow direction 1  Write a sentence 1  Copy design 1  Total score 29     6CIT Screen 10/09/2021 10/03/2020 09/27/2019  What Year? 0 points 0 points 0 points  What month? 0 points 0 points 0  points  What time? 0 points 0 points 0 points  Count back from 20 0 points 0 points 0 points  Months in reverse 0 points 0 points 0 points  Repeat phrase 0 points 0 points 2 points  Total Score 0 0 2    Immunizations Immunization History  Administered Date(s) Administered   Fluad Quad(high Dose 65+) 07/10/2020   Influenza Split 07/20/2012   Influenza, High Dose Seasonal PF 07/06/2014, 07/04/2016, 07/17/2017, 06/22/2019   Influenza, Quadrivalent, Recombinant, Inj, Pf 07/07/2013   Influenza, Seasonal, Injecte, Preservative Fre 08/11/2015   Influenza-Unspecified 06/21/2018, 06/16/2019, 06/26/2021   PFIZER(Purple Top)SARS-COV-2 Vaccination 11/05/2019, 11/26/2019, 08/07/2020, 05/11/2021   Pfizer Covid-19 Vaccine Bivalent Booster 76yrs & up 08/22/2021   Pneumococcal Conjugate-13 04/06/2015, 09/05/2016   Pneumococcal Polysaccharide-23 08/05/2012, 10/21/2014   Zoster, Live 09/05/2016    Declined tdap and shingles   Flu Vaccine status: Up to date  Pneumococcal vaccine status: Up to date  Covid-19 vaccine status: Completed vaccines   Screening Tests Health Maintenance  Topic Date Due   HEMOGLOBIN A1C  09/09/2021   Zoster Vaccines- Shingrix (1 of 2) 10/09/2022 (Originally 04/21/1960)   TETANUS/TDAP  05/15/2024 (Originally 04/21/1960)   FOOT EXAM  05/16/2022   OPHTHALMOLOGY EXAM  07/13/2022   Pneumonia Vaccine 30+ Years old  Completed   INFLUENZA VACCINE  Completed   DEXA SCAN  Completed   COVID-19 Vaccine  Completed   HPV VACCINES  Aged Out    Health Maintenance  Health Maintenance Due  Topic Date Due   HEMOGLOBIN A1C  09/09/2021    Colorectal cancer screening: No longer required.   Mammogram status: No longer required due to aged out.  Bone Density status: Completed 2020. Results reflect: Bone density results: NORMAL. Repeat every 2 years.  Lung Cancer Screening: (Low Dose CT Chest recommended if Age 24-80 years, 30 pack-year currently smoking OR have quit w/in  15years.) does not qualify.   Lung Cancer Screening Referral: na  Additional Screening:  Hepatitis C Screening: does not qualify;  Vision Screening: Recommended annual ophthalmology exams for early detection of glaucoma and other disorders of the eye. Is the patient up to date with their annual eye exam?  Yes  Who is the provider or what is the name of the office in which the patient attends annual eye exams? [facility] If pt is not established with a provider, would they like to be referred to a provider to establish care? No .   Dental Screening: Recommended annual dental exams for proper oral hygiene  Community Resource Referral /  Chronic Care Management: CRR required this visit?  No   CCM required this visit?  No      Plan:     I have personally reviewed and noted the following in the patients chart:   Medical and social history Use of alcohol, tobacco or illicit drugs  Current medications and supplements including opioid prescriptions.  Functional ability and status Nutritional status Physical activity Advanced directives List of other physicians Hospitalizations, surgeries, and ER visits in previous 12 months Vitals Screenings to include cognitive, depression, and falls Referrals and appointments  In addition, I have reviewed and discussed with patient certain preventive protocols, quality metrics, and best practice recommendations. A written personalized care plan for preventive services as well as general preventive health recommendations were provided to patient.     Sharon Seller, NP   10/09/2021    Virtual Visit via Telephone Note  I connected [email or web address] on 10/09/21 at  9:00 AM EST by telephone and verified that I am speaking with the correct person using two identifiers.  Location: Patient: home Provider: [facility]    I discussed the limitations, risks, security and privacy concerns of performing an evaluation and management service by telephone and  the availability of in person appointments. I also discussed with the patient that there may be a patient responsible charge related to this service. The patient expressed understanding and agreed to proceed.   I discussed the assessment and treatment plan with the patient. The patient was provided an opportunity to ask questions and all were answered. The patient agreed with the plan and demonstrated an understanding of the instructions.   The patient was advised to call back or seek an in-person evaluation if the symptoms worsen or if the condition fails to improve as anticipated.  I provided 16 minutes of non-face-to-face time during this encounter.  Janene Harvey. [employer] printed and mailed

## 2021-10-09 NOTE — Patient Instructions (Signed)
[name] , Thank you for taking time to come for your Medicare Wellness Visit. I appreciate your ongoing commitment to your health goals. Please review the following plan we discussed and let me know if I can assist you in the future.   Screening recommendations/referrals: Colonoscopy aged out Mammogram aged out Bone Density declines at this time.  Recommended yearly ophthalmology/optometry visit for glaucoma screening and checkup Recommended yearly dental visit for hygiene and checkup  Vaccinations: Influenza vaccine up to date Pneumococcal vaccine up to date Tdap vaccine declines at this time.  Shingles vaccine declines at this time.     Advanced directives: on file.   Conditions/risks identified: advance age, diabetes  Next appointment: yearly for awv   Preventive Care 20 Years and Older, Female Preventive care refers to lifestyle choices and visits with your health care provider that can promote health and wellness. What does preventive care include? A yearly physical exam. This is also called an annual well check. Dental exams once or twice a year. Routine eye exams. Ask your health care provider how often you should have your eyes checked. Personal lifestyle choices, including: Daily care of your teeth and gums. Regular physical activity. Eating a healthy diet. Avoiding tobacco and drug use. Limiting alcohol use. Practicing safe sex. Taking low-dose aspirin every day. Taking vitamin and mineral supplements as recommended by your health care provider. What happens during an annual well check? The services and screenings done by your health care provider during your annual well check will depend on your age, overall health, lifestyle risk factors, and family history of disease. Counseling  Your health care provider may ask you questions about your: Alcohol use. Tobacco use. Drug use. Emotional well-being. Home and relationship well-being. Sexual  activity. Eating habits. History of falls. Memory and ability to understand (cognition). Work and work Astronomer. Reproductive health. Screening  You may have the following tests or measurements: Height, weight, and BMI. Blood pressure. Lipid and cholesterol levels. These may be checked every 5 years, or more frequently if you are over 63 years old. Skin check. Lung cancer screening. You may have this screening every year starting at age 59 if you have a 30-pack-year history of smoking and currently smoke or have quit within the past 15 years. Fecal occult blood test (FOBT) of the stool. You may have this test every year starting at age 55. Flexible sigmoidoscopy or colonoscopy. You may have a sigmoidoscopy every 5 years or a colonoscopy every 10 years starting at age 33. Hepatitis C blood test. Hepatitis B blood test. Sexually transmitted disease (STD) testing. Diabetes screening. This is done by checking your blood sugar (glucose) after you have not eaten for a while (fasting). You may have this done every 1-3 years. Bone density scan. This is done to screen for osteoporosis. You may have this done starting at age 4. Mammogram. This may be done every 1-2 years. Talk to your health care provider about how often you should have regular mammograms. Talk with your health care provider about your test results, treatment options, and if necessary, the need for more tests. Vaccines  Your health care provider may recommend certain vaccines, such as: Influenza vaccine. This is recommended every year. Tetanus, diphtheria, and acellular pertussis (Tdap, Td) vaccine. You may need a Td booster every 10 years. Zoster vaccine. You may need this after age 56. Pneumococcal 13-valent conjugate (PCV13) vaccine. One dose is recommended after age 78. Pneumococcal polysaccharide (PPSV23) vaccine. One dose is recommended after age  82. Talk to your health care provider about which screenings and vaccines  you need and how often you need them. This information is not intended to replace advice given to you by your health care provider. Make sure you discuss any questions you have with your health care provider. Document Released: 11/03/2015 Document Revised: 06/26/2016 Document Reviewed: 08/08/2015 Elsevier Interactive Patient Education  2017 [employer] Prevention in the Home Falls can cause injuries. They can happen to people of all ages. There are many things you can do to make your home safe and to help prevent falls. What can I do on the outside of my home? Regularly fix the edges of walkways and driveways and fix any cracks. Remove anything that might make you trip as you walk through a door, such as a raised step or threshold. Trim any bushes or trees on the path to your home. Use bright outdoor lighting. Clear any walking paths of anything that might make someone trip, such as rocks or tools. Regularly check to see if handrails are loose or broken. Make sure that both sides of any steps have handrails. Any raised decks and porches should have guardrails on the edges. Have any leaves, snow, or ice cleared regularly. Use sand or salt on walking paths during winter. Clean up any spills in your garage right away. This includes oil or grease spills. What can I do in the bathroom? Use night lights. Install grab bars by the toilet and in the tub and shower. Do not use towel bars as grab bars. Use non-skid mats or decals in the tub or shower. If you need to sit down in the shower, use a plastic, non-slip stool. Keep the floor dry. Clean up any water that spills on the floor as soon as it happens. Remove soap buildup in the tub or shower regularly. Attach bath mats securely with double-sided non-slip rug tape. Do not have throw rugs and other things on the floor that can make you trip. What can I do in the bedroom? Use night lights. Make sure that you have a light by your bed that  is easy to reach. Do not use any sheets or blankets that are too big for your bed. They should not hang down onto the floor. Have a firm chair that has side arms. You can use this for support while you get dressed. Do not have throw rugs and other things on the floor that can make you trip. What can I do in the kitchen? Clean up any spills right away. Avoid walking on wet floors. Keep items that you use a lot in easy-to-reach places. If you need to reach something above you, use a strong step stool that has a grab bar. Keep electrical cords out of the way. Do not use floor polish or wax that makes floors slippery. If you must use wax, use non-skid floor wax. Do not have throw rugs and other things on the floor that can make you trip. What can I do with my stairs? Do not leave any items on the stairs. Make sure that there are handrails on both sides of the stairs and use them. Fix handrails that are broken or loose. Make sure that handrails are as long as the stairways. Check any carpeting to make sure that it is firmly attached to the stairs. Fix any carpet that is loose or worn. Avoid having throw rugs at the top or bottom of the stairs. If you do have  throw rugs, attach them to the floor with carpet tape. Make sure that you have a light switch at the top of the stairs and the bottom of the stairs. If you do not have them, ask someone to add them for you. What else can I do to help prevent falls? Wear shoes that: Do not have high heels. Have rubber bottoms. Are comfortable and fit you well. Are closed at the toe. Do not wear sandals. If you use a stepladder: Make sure that it is fully opened. Do not climb a closed stepladder. Make sure that both sides of the stepladder are locked into place. Ask someone to hold it for you, if possible. Clearly mark and make sure that you can see: Any grab bars or handrails. First and last steps. Where the edge of each step is. Use tools that help you  move around (mobility aids) if they are needed. These include: Canes. Walkers. Scooters. Crutches. Turn on the lights when you go into a dark area. Replace any light bulbs as soon as they burn out. Set up your furniture so you have a clear path. Avoid moving your furniture around. If any of your floors are uneven, fix them. If there are any pets around you, be aware of where they are. Review your medicines with your doctor. Some medicines can make you feel dizzy. This can increase your chance of falling. Ask your doctor what other things that you can do to help prevent falls. This information is not intended to replace advice given to you by your health care provider. Make sure you discuss any questions you have with your health care provider. Document Released: 08/03/2009 Document Revised: 03/14/2016 Document Reviewed: 11/11/2014 Elsevier Interactive Patient Education  2017 [employer].

## 2021-10-16 ENCOUNTER — Other Ambulatory Visit: Payer: Self-pay | Admitting: Nurse Practitioner

## 2021-10-24 ENCOUNTER — Ambulatory Visit: Payer: Medicare HMO | Admitting: Orthopaedic Surgery

## 2021-10-29 ENCOUNTER — Ambulatory Visit (INDEPENDENT_AMBULATORY_CARE_PROVIDER_SITE_OTHER): Payer: Medicare HMO

## 2021-10-29 ENCOUNTER — Ambulatory Visit (INDEPENDENT_AMBULATORY_CARE_PROVIDER_SITE_OTHER): Payer: Medicare HMO | Admitting: Orthopaedic Surgery

## 2021-10-29 ENCOUNTER — Other Ambulatory Visit: Payer: Self-pay

## 2021-10-29 ENCOUNTER — Encounter: Payer: Self-pay | Admitting: Orthopaedic Surgery

## 2021-10-29 DIAGNOSIS — Z96651 Presence of right artificial knee joint: Secondary | ICD-10-CM

## 2021-10-29 NOTE — Progress Notes (Signed)
The patient is a 81 year old female who is almost 8 months out from a right knee replacement.  She says she is good range of motion and strength of that right knee and has no issues with that at all.  She says it feels much better than it did before surgery.  She does not walk with assistive device.  She has had no acute change in her medical status.  Examination right knee shows good flexion and extension.  The knee is loosely stable.  There is minimal swelling.  2 views of the right knee show well-seated total knee arthroplasty with no complicating features.  At this point follow-up can be as needed since she is doing well.  All question concerns were answered and addressed.

## 2021-11-04 ENCOUNTER — Other Ambulatory Visit: Payer: Self-pay | Admitting: Nurse Practitioner

## 2021-11-16 ENCOUNTER — Other Ambulatory Visit: Payer: Self-pay

## 2021-11-16 ENCOUNTER — Encounter: Payer: Self-pay | Admitting: Nurse Practitioner

## 2021-11-16 ENCOUNTER — Ambulatory Visit (INDEPENDENT_AMBULATORY_CARE_PROVIDER_SITE_OTHER): Payer: Medicare HMO | Admitting: Nurse Practitioner

## 2021-11-16 VITALS — BP 154/82 | HR 99 | Temp 97.1°F | Ht 62.0 in | Wt 162.0 lb

## 2021-11-16 DIAGNOSIS — E119 Type 2 diabetes mellitus without complications: Secondary | ICD-10-CM

## 2021-11-16 DIAGNOSIS — E782 Mixed hyperlipidemia: Secondary | ICD-10-CM | POA: Diagnosis not present

## 2021-11-16 DIAGNOSIS — M1711 Unilateral primary osteoarthritis, right knee: Secondary | ICD-10-CM

## 2021-11-16 DIAGNOSIS — M1A9XX0 Chronic gout, unspecified, without tophus (tophi): Secondary | ICD-10-CM

## 2021-11-16 DIAGNOSIS — I10 Essential (primary) hypertension: Secondary | ICD-10-CM

## 2021-11-16 NOTE — Progress Notes (Signed)
Careteam: Patient Care Team: Lauree Chandler, NP as PCP - General (Geriatric Medicine)  PLACE OF SERVICE:  [place] Directive information Does Patient Have a Medical Advance Directive?: Yes, Type of Advance Directive: [place];Living will, Does patient want to make changes to medical advance directive?: No - Patient declined  Allergies  Allergen Reactions   Bactrim [Sulfamethoxazole-Trimethoprim] Diarrhea   Percocet [Oxycodone-Acetaminophen] Nausea And Vomiting   Clindamycin/Lincomycin Rash and Diarrhea    Terrible stomach cramps per patient   Penicillins Rash, Hives and Swelling    Chief Complaint  Patient presents with   Medical Management of Chronic Issues    6 month follow-up      HPI: Patient is a 81 y.o. female for a follow up.   Pt states blood pressure runs in the 120's/ 70's at home and HR in the 50's -60's.   States glucose is 109 this morning, and 94 mg/ dL 11/15/21 in the am.   No issues with gout flare ups. Does diet modification and takes allopurinol to control gout.  Arthritis is controlled with tylenol. Has tried Voltaren gel but not effective.   Takes medications regularly with no issues or concerns.    Eats healthy and stay active at home with house hold routines.   Review of Systems:  Review of Systems  Constitutional:  Negative for chills, diaphoresis, fever, malaise/fatigue and weight loss.  Respiratory:  Negative for cough, shortness of breath and wheezing.   Cardiovascular:  Negative for chest pain, palpitations, orthopnea and leg swelling.  Gastrointestinal:  Negative for abdominal pain, constipation, diarrhea, heartburn, nausea and vomiting.  Genitourinary:  Negative for dysuria and frequency.  Musculoskeletal:  Negative for joint pain.  Neurological:  Negative for dizziness and headaches.  Psychiatric/Behavioral:  Negative for depression. The patient is not nervous/anxious.    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 Ladean Raya   carpal tunnel Bilateral 1998   CATARACT EXTRACTION Bilateral 10/2020   DILATION AND CURETTAGE OF UTERUS  1964   Dr. Leonia Reader   MENISCUS REPAIR Right 2010   Dr. Ladean Raya   PARTIAL HYSTERECTOMY  1973   ovaries remain   Stroke  1994   TIA   [place] Right 03/13/2021   Procedure: RIGHT TOTAL KNEE ARTHROPLASTY;  Surgeon: Mcarthur Rossetti, MD;  Location: [place];  Service: Orthopedics;  Laterality: Right;   Social History:   reports that she has quit smoking. Her smoking use included cigarettes. She has never used smokeless tobacco. She reports current alcohol use of about 1.0 - 2.0 standard drink per week. She reports that she does not use drugs.  Family History  Problem Relation Age of Onset   Hypertension Son    Hypertension Son    Hypertension Son     Medications: Patient's Medications  New Prescriptions   No medications on file  Previous Medications   ACETAMINOPHEN (TYLENOL) 500 MG TABLET    Take 1,000 mg by mouth every 8 (eight) hours as needed for moderate pain.  ALLOPURINOL (ZYLOPRIM) 300 MG TABLET    TAKE 1 TABLET DAILY   AMLODIPINE (NORVASC) 5 MG TABLET    TAKE 1 TABLET DAILY   ATORVASTATIN (LIPITOR) 40 MG TABLET    TAKE 1 TABLET DAILY   BIOTIN 53614 MCG TABS    Take 10,000 mcg by mouth daily at 12 noon.   CHOLECALCIFEROL (VITAMIN D3) 50 MCG (2000 UT) CAPSULE    Take 2,000 Units by mouth daily.   [facility] ASPIRIN EC 325 MG EC TABLET    TAKE 1 TABLET (325 MG TOTAL) BY MOUTH 2 (TWO) TIMES DAILY AFTER A MEAL.   FOLIC ACID (FOLVITE) 431 MCG TABLET    Take 800 mcg by mouth daily.     GLUCOSE BLOOD (ONETOUCH VERIO) TEST STRIP    USE TO TEST BLOOD SUGAR    ONCE DAILY   HYDROCHLOROTHIAZIDE (HYDRODIURIL) 12.5 MG TABLET    Take 1 tablet (12.5 mg total) by mouth daily.   LANCETS (ONETOUCH DELICA PLUS [id number]) MISC    USE TO TEST BLOOD SUGAR    ONCE DAILY.   LATANOPROST (XALATAN) 0.005 % OPHTHALMIC SOLUTION    Place 1 drop into both eyes at bedtime.   LOSARTAN (COZAAR) 100 MG TABLET    TAKE 1 TABLET BY MOUTH EVERY DAY   METFORMIN (GLUCOPHAGE) 500 MG TABLET    TAKE 1 TABLET DAILY  Modified Medications   No medications on file  Discontinued Medications   No medications on file    Physical Exam:  Vitals:   11/16/21 1054  BP: (!) 154/82  Pulse: 99  Temp: (!) 97.1 F (36.2 C)  TempSrc: Temporal  SpO2: 98%  Weight: 162 lb (73.5 kg)  Height: 5' 2"  (1.575 m)   Body mass index is 29.63 kg/m. Wt Readings from Last 3 Encounters:  11/16/21 162 lb (73.5 kg)  05/16/21 159 lb (72.1 kg)  03/09/21 163 lb 2 oz (74 kg)    Physical Exam Constitutional:      General: She is not in acute distress.    Appearance: Normal appearance. She is well-developed and normal weight. She is not diaphoretic.  HENT:     Head: Normocephalic and atraumatic.     Mouth/Throat:     Pharynx: No oropharyngeal exudate.  Eyes:     Conjunctiva/sclera: Conjunctivae normal.     Pupils: Pupils are equal, round, and reactive to light.  Cardiovascular:     Rate and Rhythm: Normal rate and regular rhythm.     Heart sounds: Normal heart sounds.  Pulmonary:     Effort: Pulmonary effort is normal.     Breath sounds: Normal breath sounds.  Abdominal:     General: Bowel sounds are normal.     Palpations: Abdomen is soft.  Musculoskeletal:     Cervical back: Normal range of motion and neck supple.     Right lower leg: No edema.     Left lower leg: No edema.  Skin:    General: Skin is warm and dry.  Neurological:     Mental Status: She is alert and oriented to person, place, and time.  Psychiatric:         Mood and Affect: Mood normal.        Behavior: Behavior normal.        Thought Content: Thought content normal.        Judgment: Judgment normal.    Labs reviewed: Basic Metabolic Panel: Recent Labs    03/09/21 1400 03/14/21 0636 05/16/21 1116  NA  138 132* 140  K 4.4 3.0* 4.0  CL 103 95* 104  CO2 26 25 25   GLUCOSE 115* 181* 95  BUN 19 7* 13  CREATININE 0.69 0.66 0.56*  CALCIUM 10.8* 9.9 11.3*   Liver Function Tests: Recent Labs    05/16/21 1116  AST 17  ALT 16  BILITOT 0.9  PROT 6.7   No results for input(s): LIPASE, AMYLASE in the last 8760 hours. No results for input(s): AMMONIA in the last 8760 hours. CBC: Recent Labs    03/09/21 1400 03/14/21 0636 05/16/21 1116  WBC 9.0 14.8* 7.0  NEUTROABS  --   --  4,123  HGB 14.9 13.5 13.6  HCT 44.7 40.3 42.1  MCV 97.4 95.3 95.5  PLT 311 287 323   Lipid Panel: No results for input(s): CHOL, HDL, LDLCALC, TRIG, CHOLHDL, LDLDIRECT in the last 8760 hours. TSH: No results for input(s): TSH in the last 8760 hours. A1C: Lab Results  Component Value Date   HGBA1C 6.0 (H) 03/09/2021     Assessment/Plan.  1. Type 2 diabetes mellitus without complication, without long-term current use of insulin ([place]) - continues on metformin as prescribed, Encouraged dietary compliance, routine foot care/monitoring and to keep up with diabetic eye exams through ophthalmology  - Hemoglobin A1c - CMP with eGFR([facility]) - CBC with Differential/Platelet  2. Mixed hyperlipidemia - Eats healthy and takes active in the home  -continue on lipitor daily  - CMP with eGFR([facility]) - Lipid panel  3. Primary osteoarthritis of right knee -s/p total knee replacement per Dr Ninfa Linden in May 2022 doing well at this time, continues on tylenol PRN - CMP with eGFR([facility]) - CBC with Differential/Platelet  4. Hypercalcemia - CMP with eGFR([facility])  5. Chronic gout without tophus, unspecified cause, unspecified site No recent flares, uric acid  at goal on last lab  6. Essential hypertension with goal blood pressure less than 140/90 --Blood pressure elevated today, but typically well controlled -Patient reports bp typically elevated in office and home blood pressures are well controlled -No changes to medications today  -will have pt continue to monitor home bp goal <352/48 -follow metabolic panel  Next appt: Return in about 6 months (around 05/16/2022).  I personally was present during the history, physical exam and medical decision-making activities of this service and have verified that the service and findings are accurately documented in the students note Janett Billow K. [place], [facility] [phone]

## 2021-11-17 LAB — LIPID PANEL
Cholesterol: 189 mg/dL (ref <200)
HDL: 79 mg/dL (ref >=50)
LDL Cholesterol (Calc): 90 mg/dL (calc)
Non-HDL Cholesterol (Calc): 110 mg/dL (calc) (ref <130)
Total CHOL/HDL Ratio: 2.4 (calc) (ref <5.0)
Triglycerides: 106 mg/dL (ref <150)

## 2021-11-17 LAB — COMPLETE METABOLIC PANEL WITH GFR
AG Ratio: 1.8 (calc) (ref 1.0–2.5)
ALT: 17 U/L (ref 6–29)
AST: 20 U/L (ref 10–35)
Albumin: 4.8 g/dL (ref 3.6–5.1)
Alkaline phosphatase (APISO): 103 U/L (ref 37–153)
BUN: 20 mg/dL (ref 7–25)
CO2: 26 mmol/L (ref 20–32)
Calcium: 11.4 mg/dL — ABNORMAL HIGH (ref 8.6–10.4)
Chloride: 103 mmol/L (ref 98–110)
Creat: 0.68 mg/dL (ref 0.60–0.95)
Globulin: 2.6 g/dL (calc) (ref 1.9–3.7)
Glucose, Bld: 87 mg/dL (ref 65–99)
Potassium: 3.9 mmol/L (ref 3.5–5.3)
Sodium: 141 mmol/L (ref 135–146)
Total Bilirubin: 1.1 mg/dL (ref 0.2–1.2)
Total Protein: 7.4 g/dL (ref 6.1–8.1)
eGFR: 88 mL/min/{1.73_m2} (ref >=60)

## 2021-11-17 LAB — HEMOGLOBIN A1C

## 2021-11-17 LAB — CBC WITH DIFFERENTIAL/PLATELET

## 2021-11-19 ENCOUNTER — Telehealth: Payer: Self-pay

## 2021-11-19 DIAGNOSIS — E782 Mixed hyperlipidemia: Secondary | ICD-10-CM

## 2021-11-19 DIAGNOSIS — E119 Type 2 diabetes mellitus without complications: Secondary | ICD-10-CM

## 2021-11-19 NOTE — Telephone Encounter (Signed)
Per onsite [facility] lab tech patient was a hard stick last Friday and unfortunately the lab was not able to process the CBCD and A1c as she only collected a small amount of blood in tube.  Per Edwena Blow, after speaking with Lauree Chandler, NP patient needs to be contacted to recollect.  Spoke with patient and scheduled non-fasting lab appointment for tomorrow.

## 2021-11-20 ENCOUNTER — Other Ambulatory Visit: Payer: Medicare HMO

## 2021-11-20 ENCOUNTER — Other Ambulatory Visit: Payer: Self-pay

## 2021-11-20 DIAGNOSIS — E119 Type 2 diabetes mellitus without complications: Secondary | ICD-10-CM

## 2021-11-20 DIAGNOSIS — E782 Mixed hyperlipidemia: Secondary | ICD-10-CM

## 2021-11-21 LAB — CBC WITH DIFFERENTIAL/PLATELET
Absolute Monocytes: 619 cells/uL (ref 200–950)
Basophils Absolute: 60 cells/uL (ref 0–200)
Basophils Relative: 0.7 %
Eosinophils Absolute: 120 cells/uL (ref 15–500)
Eosinophils Relative: 1.4 %
HCT: 45.2 % — ABNORMAL HIGH (ref 35.0–45.0)
Hemoglobin: 15.2 g/dL (ref 11.7–15.5)
Lymphs Abs: 3079 cells/uL (ref 850–3900)
MCH: 31.9 pg (ref 27.0–33.0)
MCHC: 33.6 g/dL (ref 32.0–36.0)
MCV: 94.8 fL (ref 80.0–100.0)
MPV: 9.6 fL (ref 7.5–12.5)
Monocytes Relative: 7.2 %
Neutro Abs: 4721 cells/uL (ref 1500–7800)
Neutrophils Relative %: 54.9 %
Platelets: 311 10*3/uL (ref 140–400)
RBC: 4.77 10*6/uL (ref 3.80–5.10)
RDW: 12.4 % (ref 11.0–15.0)
Total Lymphocyte: 35.8 %
WBC: 8.6 10*3/uL (ref 3.8–10.8)

## 2021-11-21 LAB — HEMOGLOBIN A1C
Hgb A1c MFr Bld: 5.5 % of total Hgb (ref <5.7)
Mean Plasma Glucose: 111 mg/dL
eAG (mmol/L): 6.2 mmol/L

## 2021-12-19 ENCOUNTER — Other Ambulatory Visit: Payer: Self-pay | Admitting: Nurse Practitioner

## 2021-12-19 DIAGNOSIS — I10 Essential (primary) hypertension: Secondary | ICD-10-CM

## 2021-12-31 ENCOUNTER — Other Ambulatory Visit: Payer: Self-pay | Admitting: Nurse Practitioner

## 2021-12-31 DIAGNOSIS — I10 Essential (primary) hypertension: Secondary | ICD-10-CM

## 2022-02-06 ENCOUNTER — Other Ambulatory Visit: Payer: Self-pay | Admitting: Nurse Practitioner

## 2022-03-09 ENCOUNTER — Encounter: Payer: Self-pay | Admitting: Nurse Practitioner

## 2022-03-15 ENCOUNTER — Other Ambulatory Visit: Payer: Self-pay | Admitting: Nurse Practitioner

## 2022-03-19 MED ORDER — HYDROCHLOROTHIAZIDE 12.5 MG PO TABS: 12.5000 mg | ORAL_TABLET | Freq: Every day | ORAL | Status: DC

## 2022-03-20 ENCOUNTER — Encounter: Payer: Self-pay | Admitting: Nurse Practitioner

## 2022-03-20 MED ORDER — HYDROCHLOROTHIAZIDE 12.5 MG PO TABS: 12.5000 mg | ORAL_TABLET | Freq: Every day | ORAL | 1 refills | Status: DC

## 2022-04-29 ENCOUNTER — Other Ambulatory Visit: Payer: Self-pay | Admitting: *Deleted

## 2022-04-29 MED ORDER — ONETOUCH VERIO VI STRP: ORAL_STRIP | 3 refills | Status: DC

## 2022-04-29 NOTE — Telephone Encounter (Signed)
[facility] requested refill.  ?

## 2022-05-16 NOTE — Progress Notes (Signed)
Careteam: Patient Care Team: Debra Chandler, NP as PCP - General (Geriatric Medicine)  PLACE OF SERVICE:  [place] Directive information Does Patient Have a Medical Advance Directive?: Yes, Type of Advance Directive: [place];Living will, Does patient want to make changes to medical advance directive?: No - Patient declined  Allergies  Allergen Reactions   Bactrim [Sulfamethoxazole-Trimethoprim] Diarrhea   Percocet [Oxycodone-Acetaminophen] Nausea And Vomiting   Clindamycin/Lincomycin Rash and Diarrhea    Terrible stomach cramps per patient   Penicillins Rash, Hives and Swelling    Chief Complaint  Patient presents with   Medical Management of Chronic Issues    6 month follow-up and fasting if labs due. Foot exam today.      HPI: Patient is a 81 y.o. female for routine follow up. Doing well, no acute concerns.   Blood pressures elevated in office but home reading is good at 139/85. Anytime she is in an office bp goes up. Currently on hctz, losartan and norvasc  Last may she had total knee- no pain at this time.   No recent gout flares.   DM- well controlled at home, checks daily Continues on metformin 500 mg daily   Review of Systems:  Review of Systems  Constitutional:  Negative for chills, fever and weight loss.  HENT:  Negative for tinnitus.   Respiratory:  Negative for cough, sputum production and shortness of breath.   Cardiovascular:  Negative for chest pain, palpitations and leg swelling.  Gastrointestinal:  Negative for abdominal pain, constipation, diarrhea and heartburn.  Genitourinary:  Negative for dysuria, frequency and urgency.  Musculoskeletal:  Negative for back pain, falls, joint pain and myalgias.  Skin: Negative.   Neurological:  Negative for dizziness and headaches.  Psychiatric/Behavioral:  Negative for depression and memory loss. The patient does not have insomnia.     Past Medical History:  Diagnosis  Date   Arthritis    DM (diabetes mellitus) ([place])    type 2   Gout    Hypercalcemia    Hypercholesteremia    Hypertension    PONV (postoperative nausea and vomiting)    Rheumatoid arteritis ([place])    Stroke [facility])    TIA 1994   Past Surgical History:  Procedure Laterality Date   bladder prolapse  2018   Dr. Ladean Raya   carpal tunnel Bilateral 1998   CATARACT EXTRACTION Bilateral 10/2020   DILATION AND CURETTAGE OF UTERUS  1964   Dr. Leonia Reader   MENISCUS REPAIR Right 2010   Dr. Ladean Raya   PARTIAL HYSTERECTOMY  1973   ovaries remain   Stroke  1994   TIA   [place] Right 03/13/2021   Procedure: RIGHT TOTAL KNEE ARTHROPLASTY;  Surgeon: Mcarthur Rossetti, MD;  Location: [place];  Service: Orthopedics;  Laterality: Right;   Social History:   reports that she has quit smoking. Her smoking use included cigarettes. She has never used smokeless tobacco. She reports current alcohol use of about 1.0 - 2.0 standard drink of alcohol per week. She reports that she does not use drugs.  Family History  Problem Relation Age of Onset   Hypertension Son    Hypertension Son    Hypertension Son     Medications: Patient's Medications  New Prescriptions   No medications on file  Previous Medications   ACETAMINOPHEN (TYLENOL) 500 MG TABLET    Take 1,000 mg by mouth every 8 (eight)  hours as needed for moderate pain.   ALLOPURINOL (ZYLOPRIM) 300 MG TABLET    TAKE 1 TABLET DAILY   AMLODIPINE (NORVASC) 5 MG TABLET    TAKE 1 TABLET DAILY   ATORVASTATIN (LIPITOR) 40 MG TABLET    TAKE 1 TABLET DAILY   BIOTIN 50093 MCG TABS    Take 10,000 mcg by mouth daily at 12 noon.   CHOLECALCIFEROL (VITAMIN D3) 50 MCG (2000 UT) CAPSULE    Take 2,000 Units by mouth daily.   [facility] ASPIRIN EC 325 MG EC TABLET    TAKE 1 TABLET (325 MG TOTAL) BY MOUTH 2 (TWO) TIMES DAILY AFTER A MEAL.   FOLIC ACID (FOLVITE) 818 MCG TABLET    Take 800 mcg by mouth daily.     GLUCOSE BLOOD (ONETOUCH VERIO) TEST STRIP    Use to test Blood Sugar once daily. Dx: E11.9   HYDROCHLOROTHIAZIDE (HYDRODIURIL) 12.5 MG TABLET    Take 1 tablet (12.5 mg total) by mouth daily.   LANCETS (ONETOUCH DELICA PLUS [id number]) MISC    USE TO TEST BLOOD SUGAR    ONCE DAILY.   LATANOPROST (XALATAN) 0.005 % OPHTHALMIC SOLUTION    Place 1 drop into both eyes at bedtime.   LOSARTAN (COZAAR) 100 MG TABLET    TAKE 1 TABLET BY MOUTH EVERY DAY   METFORMIN (GLUCOPHAGE) 500 MG TABLET    TAKE 1 TABLET DAILY  Modified Medications   No medications on file  Discontinued Medications   No medications on file    Physical Exam:  Vitals:   05/17/22 1038  BP: (!) 152/88  Pulse: (!) 101  Temp: (!) 97.5 F (36.4 C)  TempSrc: Temporal  SpO2: 98%  Weight: 164 lb (74.4 kg)  Height: _0  (1.575 m)   Body mass index is 30 kg/m. Wt Readings from Last 3 Encounters:  05/17/22 164 lb (74.4 kg)  11/16/21 162 lb (73.5 kg)  05/16/21 159 lb (72.1 kg)    Physical Exam Constitutional:      General: She is not in acute distress.    Appearance: She is well-developed. She is not diaphoretic.  HENT:     Head: Normocephalic and atraumatic.     Mouth/Throat:     Pharynx: No oropharyngeal exudate.  Eyes:     Conjunctiva/sclera: Conjunctivae normal.     Pupils: Pupils are equal, round, and reactive to light.  Cardiovascular:     Rate and Rhythm: Normal rate and regular rhythm.     Heart sounds: Normal heart sounds.  Pulmonary:     Effort: Pulmonary effort is normal.     Breath sounds: Normal breath sounds.  Abdominal:     General: Bowel sounds are normal.     Palpations: Abdomen is soft.  Musculoskeletal:     Cervical back: Normal range of motion and neck supple.     Right lower leg: No edema.     Left lower leg: No edema.  Skin:    General: Skin is warm and dry.  Neurological:     Mental Status: She is alert.  Psychiatric:        Mood and Affect: Mood normal.     Labs reviewed: Basic  Metabolic Panel: Recent Labs    11/16/21 1134  NA 141  K 3.9  CL 103  CO2 26  GLUCOSE 87  BUN 20  CREATININE 0.68  CALCIUM 11.4*   Liver Function Tests: Recent Labs    11/16/21 1134  AST 20  ALT 17  BILITOT 1.1  PROT 7.4   No results for input(s): "LIPASE", "AMYLASE" in the last 8760 hours. No results for input(s): "AMMONIA" in the last 8760 hours. CBC: Recent Labs    11/16/21 1134 11/20/21 1049  WBC CANCELED 8.6  NEUTROABS  --  4,721  HGB  --  15.2  HCT  --  45.2*  MCV  --  94.8  PLT  --  311   Lipid Panel: Recent Labs    11/16/21 1134  CHOL 189  HDL 79  LDLCALC 90  TRIG 106  CHOLHDL 2.4   TSH: No results for input(s): "TSH" in the last 8760 hours. A1C: Lab Results  Component Value Date   HGBA1C 5.5 11/20/2021     Assessment/Plan 1. Essential hypertension with goal blood pressure less than 140/90 --Blood pressure elevated today but typically well controlled -home blood pressures are well controlled -her calcium levels have been high and question if HCTZ contributing, will have her stop medication and follow up lab.  -will have pt continue to monitor home bp goal <140/90, to notify if readings remain high on 3 different days  -follow metabolic panel - CBC with Differential/Platelet  2. Chronic gout without tophus, unspecified cause, unspecified site -no recent flare -continue dietary modifications with allopurinol 300 mg daily - Uric Acid  3. Mixed hyperlipidemia -continues on lipitor 40 mg daily with dietary modifications.  - Lipid panel  4. Hypercalcemia -not on supplement. Will have her hold HCTZ to see if this improves calcium level, she will come back for lab in 2 weeks.  - CMP with eGFR([facility]) - PTH, Intact and Calcium  5. Type 2 diabetes mellitus with other specified complication, without long-term current use of insulin ([facility]) -Encouraged dietary compliance, routine foot care/monitoring and to keep up with diabetic eye exams  through ophthalmology  - Urine microalbumin-creatinine with uACR  6. Primary osteoarthritis involving multiple joints -stable at this time.   7. Vitamin D deficiency -continues on supplement.  - Vitamin D, 25-hydroxy  8. Class 1 obesity due to excess calories with serious comorbidity and body mass index (BMI) of 30.0 to 30.9 in adult --education provided on healthy weight loss through increase in physical activity and proper nutrition     Return in about 6 months (around 11/17/2022) for routine follow up . Carlos American. [place], [facility] [phone]

## 2022-05-17 ENCOUNTER — Ambulatory Visit (INDEPENDENT_AMBULATORY_CARE_PROVIDER_SITE_OTHER): Payer: Medicare HMO | Admitting: Nurse Practitioner

## 2022-05-17 ENCOUNTER — Encounter: Payer: Self-pay | Admitting: Nurse Practitioner

## 2022-05-17 VITALS — BP 152/88 | HR 101 | Temp 97.5°F | Ht 62.0 in | Wt 164.0 lb

## 2022-05-17 DIAGNOSIS — I10 Essential (primary) hypertension: Secondary | ICD-10-CM

## 2022-05-17 DIAGNOSIS — M1A9XX0 Chronic gout, unspecified, without tophus (tophi): Secondary | ICD-10-CM

## 2022-05-17 DIAGNOSIS — E782 Mixed hyperlipidemia: Secondary | ICD-10-CM | POA: Diagnosis not present

## 2022-05-17 DIAGNOSIS — M159 Polyosteoarthritis, unspecified: Secondary | ICD-10-CM

## 2022-05-17 DIAGNOSIS — Z6830 Body mass index (BMI) 30.0-30.9, adult: Secondary | ICD-10-CM

## 2022-05-17 DIAGNOSIS — E6609 Other obesity due to excess calories: Secondary | ICD-10-CM

## 2022-05-17 DIAGNOSIS — E559 Vitamin D deficiency, unspecified: Secondary | ICD-10-CM

## 2022-05-17 DIAGNOSIS — E1169 Type 2 diabetes mellitus with other specified complication: Secondary | ICD-10-CM

## 2022-05-21 ENCOUNTER — Other Ambulatory Visit: Payer: Medicare HMO

## 2022-05-31 ENCOUNTER — Other Ambulatory Visit: Payer: Medicare HMO

## 2022-05-31 ENCOUNTER — Encounter: Payer: Self-pay | Admitting: Nurse Practitioner

## 2022-06-01 LAB — MICROALBUMIN / CREATININE URINE RATIO
Creatinine, Urine: 19 mg/dL — ABNORMAL LOW (ref 20–275)
Microalb Creat Ratio: 95 mcg/mg creat — ABNORMAL HIGH (ref <30)
Microalb, Ur: 1.8 mg/dL

## 2022-07-16 ENCOUNTER — Encounter: Payer: Self-pay | Admitting: Nurse Practitioner

## 2022-07-23 ENCOUNTER — Encounter: Payer: Self-pay | Admitting: Nurse Practitioner

## 2022-07-29 LAB — HM DIABETES EYE EXAM

## 2022-08-11 ENCOUNTER — Other Ambulatory Visit: Payer: Self-pay | Admitting: Nurse Practitioner

## 2022-08-11 DIAGNOSIS — I10 Essential (primary) hypertension: Secondary | ICD-10-CM

## 2022-08-25 ENCOUNTER — Other Ambulatory Visit: Payer: Self-pay | Admitting: Nurse Practitioner

## 2022-09-03 ENCOUNTER — Other Ambulatory Visit: Payer: Self-pay | Admitting: Nurse Practitioner

## 2022-10-15 ENCOUNTER — Other Ambulatory Visit: Payer: Self-pay | Admitting: Nurse Practitioner

## 2022-10-15 ENCOUNTER — Encounter: Payer: Medicare HMO | Admitting: Nurse Practitioner

## 2022-10-17 ENCOUNTER — Encounter: Payer: Self-pay | Admitting: Nurse Practitioner

## 2022-10-17 NOTE — Progress Notes (Signed)
   This service is provided via telemedicine  No vital signs collected/recorded due to the encounter was a telemedicine visit.   Location of patient (ex: home, work):  Home  Patient consents to a telephone visit: Yes, see telephone visit dated 10/18/22  Location of the provider (ex: office, home):  [facility], Office   Name of any referring provider:  N/A  Names of all persons participating in the telemedicine service and their role in the encounter:  Porsha M/CMA, Abbey Chatters, NP, and Patient   Time spent on call:  11 min with medical assistant

## 2022-10-18 ENCOUNTER — Encounter: Payer: Self-pay | Admitting: Nurse Practitioner

## 2022-10-18 ENCOUNTER — Ambulatory Visit (INDEPENDENT_AMBULATORY_CARE_PROVIDER_SITE_OTHER): Payer: Medicare HMO | Admitting: Nurse Practitioner

## 2022-10-18 VITALS — BP 129/65 | Wt 162.0 lb

## 2022-10-18 DIAGNOSIS — Z0000 Encounter for general adult medical examination without abnormal findings: Secondary | ICD-10-CM

## 2022-10-18 NOTE — Patient Instructions (Signed)
Debra Madden , Thank you for taking time to come for your Medicare Wellness Visit. I appreciate your ongoing commitment to your health goals. Please review the following plan we discussed and let me know if I can assist you in the future.   Screening recommendations/referrals: Colonoscopy aged out Mammogram aged out Bone Density up to date Recommended yearly ophthalmology/optometry visit for glaucoma screening and checkup Recommended yearly dental visit for hygiene and checkup  Vaccinations: Influenza vaccine- due annually in September/October Pneumococcal vaccine up to date Tdap vaccine declines  Shingles vaccine declines     Advanced directives: on file.   Conditions/risks identified: advance age  Next appointment: yearly for awv   Preventive Care 81 Years and Older, Female Preventive care refers to lifestyle choices and visits with your health care provider that can promote health and wellness. What does preventive care include? A yearly physical exam. This is also called an annual well check. Dental exams once or twice a year. Routine eye exams. Ask your health care provider how often you should have your eyes checked. Personal lifestyle choices, including: Daily care of your teeth and gums. Regular physical activity. Eating a healthy diet. Avoiding tobacco and drug use. Limiting alcohol use. Practicing safe sex. Taking low-dose aspirin every day. Taking vitamin and mineral supplements as recommended by your health care provider. What happens during an annual well check? The services and screenings done by your health care provider during your annual well check will depend on your age, overall health, lifestyle risk factors, and family history of disease. Counseling  Your health care provider may ask you questions about your: Alcohol use. Tobacco use. Drug use. Emotional well-being. Home and relationship well-being. Sexual activity. Eating habits. History of  falls. Memory and ability to understand (cognition). Work and work Astronomer. Reproductive health. Screening  You may have the following tests or measurements: Height, weight, and BMI. Blood pressure. Lipid and cholesterol levels. These may be checked every 5 years, or more frequently if you are over 81 years old. Skin check. Lung cancer screening. You may have this screening every year starting at age 81 if you have a 30-pack-year history of smoking and currently smoke or have quit within the past 15 years. Fecal occult blood test (FOBT) of the stool. You may have this test every year starting at age 81. Flexible sigmoidoscopy or colonoscopy. You may have a sigmoidoscopy every 5 years or a colonoscopy every 10 years starting at age 45. Hepatitis C blood test. Hepatitis B blood test. Sexually transmitted disease (STD) testing. Diabetes screening. This is done by checking your blood sugar (glucose) after you have not eaten for a while (fasting). You may have this done every 1-3 years. Bone density scan. This is done to screen for osteoporosis. You may have this done starting at age 66. Mammogram. This may be done every 1-2 years. Talk to your health care provider about how often you should have regular mammograms. Talk with your health care provider about your test results, treatment options, and if necessary, the need for more tests. Vaccines  Your health care provider may recommend certain vaccines, such as: Influenza vaccine. This is recommended every year. Tetanus, diphtheria, and acellular pertussis (Tdap, Td) vaccine. You may need a Td booster every 10 years. Zoster vaccine. You may need this after age 17. Pneumococcal 13-valent conjugate (PCV13) vaccine. One dose is recommended after age 7. Pneumococcal polysaccharide (PPSV23) vaccine. One dose is recommended after age 47. Talk to your health care provider about  which screenings and vaccines you need and how often you need  them. This information is not intended to replace advice given to you by your health care provider. Make sure you discuss any questions you have with your health care provider. Document Released: 11/03/2015 Document Revised: 06/26/2016 Document Reviewed: 08/08/2015 Elsevier Interactive Patient Education  2017 [employer] Prevention in the Home Falls can cause injuries. They can happen to people of all ages. There are many things you can do to make your home safe and to help prevent falls. What can I do on the outside of my home? Regularly fix the edges of walkways and driveways and fix any cracks. Remove anything that might make you trip as you walk through a door, such as a raised step or threshold. Trim any bushes or trees on the path to your home. Use bright outdoor lighting. Clear any walking paths of anything that might make someone trip, such as rocks or tools. Regularly check to see if handrails are loose or broken. Make sure that both sides of any steps have handrails. Any raised decks and porches should have guardrails on the edges. Have any leaves, snow, or ice cleared regularly. Use sand or salt on walking paths during winter. Clean up any spills in your garage right away. This includes oil or grease spills. What can I do in the bathroom? Use night lights. Install grab bars by the toilet and in the tub and shower. Do not use towel bars as grab bars. Use non-skid mats or decals in the tub or shower. If you need to sit down in the shower, use a plastic, non-slip stool. Keep the floor dry. Clean up any water that spills on the floor as soon as it happens. Remove soap buildup in the tub or shower regularly. Attach bath mats securely with double-sided non-slip rug tape. Do not have throw rugs and other things on the floor that can make you trip. What can I do in the bedroom? Use night lights. Make sure that you have a light by your bed that is easy to reach. Do not use  any sheets or blankets that are too big for your bed. They should not hang down onto the floor. Have a firm chair that has side arms. You can use this for support while you get dressed. Do not have throw rugs and other things on the floor that can make you trip. What can I do in the kitchen? Clean up any spills right away. Avoid walking on wet floors. Keep items that you use a lot in easy-to-reach places. If you need to reach something above you, use a strong step stool that has a grab bar. Keep electrical cords out of the way. Do not use floor polish or wax that makes floors slippery. If you must use wax, use non-skid floor wax. Do not have throw rugs and other things on the floor that can make you trip. What can I do with my stairs? Do not leave any items on the stairs. Make sure that there are handrails on both sides of the stairs and use them. Fix handrails that are broken or loose. Make sure that handrails are as long as the stairways. Check any carpeting to make sure that it is firmly attached to the stairs. Fix any carpet that is loose or worn. Avoid having throw rugs at the top or bottom of the stairs. If you do have throw rugs, attach them to the floor with  carpet tape. Make sure that you have a light switch at the top of the stairs and the bottom of the stairs. If you do not have them, ask someone to add them for you. What else can I do to help prevent falls? Wear shoes that: Do not have high heels. Have rubber bottoms. Are comfortable and fit you well. Are closed at the toe. Do not wear sandals. If you use a stepladder: Make sure that it is fully opened. Do not climb a closed stepladder. Make sure that both sides of the stepladder are locked into place. Ask someone to hold it for you, if possible. Clearly mark and make sure that you can see: Any grab bars or handrails. First and last steps. Where the edge of each step is. Use tools that help you move around (mobility aids)  if they are needed. These include: Canes. Walkers. Scooters. Crutches. Turn on the lights when you go into a dark area. Replace any light bulbs as soon as they burn out. Set up your furniture so you have a clear path. Avoid moving your furniture around. If any of your floors are uneven, fix them. If there are any pets around you, be aware of where they are. Review your medicines with your doctor. Some medicines can make you feel dizzy. This can increase your chance of falling. Ask your doctor what other things that you can do to help prevent falls. This information is not intended to replace advice given to you by your health care provider. Make sure you discuss any questions you have with your health care provider. Document Released: 08/03/2009 Document Revised: 03/14/2016 Document Reviewed: 11/11/2014 Elsevier Interactive Patient Education  2017 [employer].

## 2022-10-18 NOTE — Progress Notes (Signed)
Subjective:   Syona A Winborne is a 81 y.o. female who presents for Medicare Annual (Subsequent) preventive examination.  Review of Systems    Cardiac Risk Factors include: advanced age (>49mn, >>26women);diabetes mellitus;obesity (BMI >30kg/m2)     Objective:    Today's Vitals   There is no height or weight on file to calculate BMI.     10/18/2022   12:42 PM 10/17/2022    3:35 PM 05/17/2022   10:41 AM 11/16/2021   10:13 AM 10/09/2021    9:02 AM 03/13/2021    5:50 PM 03/09/2021    1:13 PM  Advanced Directives  Does Patient Have a Medical Advance Directive? _0  No No  Type of AParamedicof [place]Blue MoundLiving will [place]Living will H[place]Living will HG[place]ietaLiving will [place]   Does patient want to make changes to medical advance directive? No - Patient declined No - Patient declined No - Patient declined No - Patient declined No - Patient declined    Copy of [place] Chart? Yes - validated most recent copy scanned in chart (See row information) Yes - validated most recent copy scanned in chart (See row information) Yes - validated most recent copy scanned in chart (See row information) Yes - validated most recent copy scanned in chart (See row information) Yes - validated most recent copy scanned in chart (See row information)    Would patient like information on creating a medical advance directive?      No - Patient declined No - Patient declined    Current Medications (verified) Outpatient Encounter Medications as of 10/18/2022  Medication Sig   acetaminophen (TYLENOL) 500 MG tablet Take 1,000 mg by mouth every 8 (eight) hours as needed for moderate pain.   allopurinol (ZYLOPRIM) 300 MG tablet TAKE 1 TABLET DAILY   amLODipine (NORVASC) 5 MG tablet TAKE 1 TABLET DAILY   atorvastatin (LIPITOR) 40 MG tablet TAKE 1 TABLET DAILY    Biotin 10000 MCG TABS Take 10,000 mcg by mouth daily at 12 noon.   Cholecalciferol (VITAMIN D3) 50 MCG (2000 UT) capsule Take 2,000 Units by mouth daily.   [facility] ASPIRIN EC 325 MG EC tablet TAKE 1 TABLET (325 MG TOTAL) BY MOUTH 2 (TWO) TIMES DAILY AFTER A MEAL.   folic acid (FOLVITE) 8277MCG tablet Take 800 mcg by mouth daily.    glucose blood (ONETOUCH VERIO) test strip Use to test Blood Sugar once daily. Dx: E11.9   hydrochlorothiazide (HYDRODIURIL) 12.5 MG tablet Take 1 tablet (12.5 mg total) by mouth daily.   Lancets (ONETOUCH DELICA PLUS LAJOINO67E MISC USE TO TEST BLOOD SUGAR    ONCE DAILY.   latanoprost (XALATAN) 0.005 % ophthalmic solution Place 1 drop into both eyes at bedtime.   losartan (COZAAR) 100 MG tablet TAKE 1 TABLET BY MOUTH EVERY DAY   metFORMIN (GLUCOPHAGE) 500 MG tablet TAKE 1 TABLET DAILY   No facility-administered encounter medications on file as of 10/18/2022.    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 Ladean Raya   carpal tunnel Bilateral 1998   CATARACT EXTRACTION Bilateral 10/2020   DILATION AND CURETTAGE OF UTERUS  1964   Dr. Leonia Reader   MENISCUS REPAIR Right 2010   Dr. Ladean Raya   PARTIAL HYSTERECTOMY  1973   ovaries remain   Stroke  1994   TIA   [place] Right 03/13/2021   Procedure: RIGHT TOTAL KNEE ARTHROPLASTY;  Surgeon: Mcarthur Rossetti, MD;  Location: [place];  Service: Orthopedics;  Laterality: Right;   Family History  Problem Relation Age  of Onset   Hypertension Son    Hypertension Son    Hypertension Son    Social History   Socioeconomic History   Marital status: Married    Spouse name: Not on file   Number of children: Not on file   Years of education: Not on file   Highest education level: Not on file  Occupational History   Not on file  Tobacco Use   Smoking status: Former    Types: Cigarettes   Smokeless tobacco: Never   Tobacco comments:    Quit at age 35  Vaping Use   Vaping Use: Never used  Substance and Sexual Activity   Alcohol use: Yes    Alcohol/week: 1.0 - 2.0 standard drink of alcohol    Types: 1 - 2 Glasses of wine per week   Drug use: Never   Sexual activity: Not Currently  Other Topics Concern   Not on file  Social History Narrative   Social History      Diet? Meats, fish, salads, occasionally shellfish, eggs, vegs, broc, cauliflower, brusel sprouts, green beans, mixed vegs, potatoes, some pasta/rice.       Do you drink/eat things with caffeine? Coffee- 1 1/2 cup per day      Marital status?           married                         What year were you married? 1964      Do you live in a house, apartment, assisted living, condo, trailer, etc.? house      Is it one or more stories? One- no stairs      How many persons live in your home? 2      Do you have any pets in your home? (please list) no      Highest level of education completed? 4 year college degree      Current or past profession: teacher      Do you exercise?         yes                             Type & how often? Walk, water aerobics      Advanced Directives      Do you have a living will? yes      Do you have a DNR form?        no                          If not, do you want to discuss one?      Do you have signed POA/HPOA for forms? yes  Functional Status      Do you have difficulty bathing or dressing yourself? no      Do you have difficulty preparing food or eating? no      Do you have difficulty  managing your medications? no      Do you have difficulty managing your finances? no      Do you have difficulty affording your medications? no   Social Determinants of Health   Financial Resource Strain: Low Risk  (09/23/2018)   Overall Financial Resource Strain (CARDIA)    Difficulty of Paying Living Expenses: Not hard at all  Food Insecurity: No Food Insecurity (09/23/2018)   Hunger Vital Sign    Worried About Running Out of Food in the Last Year: Never true    [place] in the Last Year: Never true  Transportation Needs: No Transportation Needs (09/23/2018)   PRAPARE - Hydrologist (Medical): No    Lack of Transportation (Non-Medical): No  Physical Activity: Inactive (09/23/2018)   Exercise Vital Sign    Days of Exercise per Week: 0 days    Minutes of Exercise per Session: 0 min  Stress: No Stress Concern Present (09/23/2018)   [place]    Feeling of Stress : Only a little  Social Connections: Moderately Integrated (09/23/2018)   Social Connection and Isolation Panel [NHANES]    Frequency of Communication with Friends and Family: Three times a week    Frequency of Social Gatherings with Friends and Family: Once a week    Attends Religious Services: More than 4 times per year    Active Member of [employer] or Organizations: No    Attends Music therapist: Never    Marital Status: Married    Tobacco Counseling Counseling given: Not Answered Tobacco comments: Quit at age 43   Clinical Intake:  Pre-visit preparation completed: Yes  Pain : No/denies pain     BMI - recorded: 30 Nutritional Status: BMI > 30  Obese Nutritional Risks: None Diabetes: Yes CBG done?: Yes CBG resulted in Enter/ Edit results?: No (103 @ 8:00 AM) Did pt. bring in CBG monitor from home?: No  How often do you need to have someone help you when you read instructions, pamphlets, or other  written materials from your doctor or pharmacy?: 1 - Never What is the last grade level you completed in school?: [place]  Diabetic?yes  Interpreter Needed?: No  Information entered by :: Porsha McClurkin,CMA   Activities of Daily Living    10/18/2022   12:43 PM  In your present state of health, do you have any difficulty performing the following activities:  Hearing? 0  Vision? 0  Difficulty concentrating or making decisions? 0  Walking or climbing stairs? 0  Dressing or bathing? 0  Doing errands, shopping? 0  Preparing Food and eating ? N  Using the Toilet? N  In the past six months, have you accidently leaked urine? Y  Do you have problems with loss of bowel control? N  Managing your Medications? N  Managing your Finances? N  Housekeeping or managing your Housekeeping? N    Patient Care Team: Lauree Chandler, NP as PCP - General (Geriatric Medicine)  Indicate any recent Medical Services you may have received from other than [facility] providers in the past year (date may be approximate).     Assessment:   This is a routine wellness examination for Hajar.  Hearing/Vision screen Hearing Screening - Comments:: No hearing concerns/ wear hearing aids Vision Screening - Comments:: Wear glasses  Dietary issues and exercise activities discussed: Current Exercise Habits: Home exercise routine, Type of exercise: walking, Time (Minutes): 30, Frequency (Times/Week): 7, Weekly Exercise (Minutes/Week): 210, Intensity: Mild, Exercise limited by: None identified   Goals Addressed   None    Depression Screen    10/18/2022   12:43 PM 11/16/2021   10:59 AM 10/09/2021    8:59 AM 10/03/2020   10:20 AM 05/15/2020    9:58 AM 11/17/2019    9:11 AM 09/27/2019    8:32 AM  PHQ 2/9 Scores  PHQ - 2 Score 0 0 0 0 0 0 0    Fall Risk    10/18/2022   12:43 PM 05/17/2022   10:40 AM 11/16/2021   10:59 AM 10/09/2021    9:01 AM 05/16/2021   10:34 AM  [place] in the past year? 0  0 0 0 0  Number falls in past yr: 0 0 0 0 0  Injury with Fall? 0 0 0 0 0  Risk for fall due to : _0   Follow up _1     FALL RISK PREVENTION PERTAINING TO THE HOME:  Any stairs in or around the home? No  If so, are there any without handrails?  na Home free of loose throw rugs in walkways, pet beds, electrical cords, etc? Yes  Adequate lighting in your home to reduce risk of falls? Yes   ASSISTIVE DEVICES UTILIZED TO PREVENT FALLS:  Life alert? No  Use of a cane, walker or w/c? No  Grab bars in the bathroom? Yes  Shower chair or bench in shower? Yes  Elevated toilet seat or a handicapped toilet? Yes   TIMED UP AND GO:  Was the test performed? No .    Cognitive Function:    09/23/2018    9:14 AM  MMSE - Mini Mental State Exam  Orientation to time 5  Orientation to Place 5  Registration 3  Attention/ Calculation 5  Recall 2  Language- name 2 objects 2  Language- repeat 1  Language- follow 3 step command 3  Language- read & follow direction 1  Write a sentence 1  Copy design 1  Total score 29        10/18/2022   12:43 PM 10/09/2021    9:03 AM 10/03/2020   10:24 AM 09/27/2019    8:33 AM  6CIT Screen  What Year? 0 points 0 points 0 points 0 points  What month? 0 points 0 points 0 points 0 points  What time? 0 points 0 points 0 points 0 points  Count back from 20 0 points 0 points 0 points 0 points  Months in reverse 0 points 0 points 0 points 0 points  Repeat phrase 0 points 0 points 0 points 2 points  Total Score 0 points 0 points 0 points 2 points    Immunizations Immunization History  Administered Date(s) Administered   Fluad Quad(high Dose 65+) 07/17/2017, 07/10/2020, 07/16/2022   Influenza Split 07/20/2012   Influenza, High Dose Seasonal PF 07/06/2014,  07/04/2016, 07/17/2017, 06/22/2019, 07/16/2022   Influenza, Quadrivalent, Recombinant, Inj, Pf 07/07/2013   Influenza, Seasonal, Injecte, Preservative Fre 08/11/2015   Influenza-Unspecified 06/21/2018, 06/16/2019, 06/26/2021   Moderna SARS-COV2 Booster Vaccination  07/23/2022   PFIZER(Purple Top)SARS-COV-2 Vaccination 11/05/2019, 11/26/2019, 08/07/2020, 05/11/2021   Pfizer Covid-19 Vaccine Bivalent Booster 69yr & up 08/22/2021, 03/08/2022, 03/08/2022   Pneumococcal Conjugate-13 04/06/2015, 09/05/2016   Pneumococcal Polysaccharide-23 08/05/2012, 10/21/2014   Zoster, Live 09/05/2016   Declines tdap  Flu Vaccine status: Up to date  Pneumococcal vaccine status: Up to date  Covid-19 vaccine status: Information provided on how to obtain vaccines.   Qualifies for Shingles Vaccine? Yes   Zostavax completed No   Shingrix Completed?: No.    Education has been provided regarding the importance of this vaccine. Patient has been advised to call insurance company to determine out of pocket expense if they have not yet received this vaccine. Advised may also receive vaccine at local pharmacy or Health Dept. Verbalized acceptance and understanding.  Screening Tests Health Maintenance  Topic Date Due   DTaP/Tdap/Td (1 - Tdap) Never done   Zoster Vaccines- Shingrix (1 of 2) Never done   HEMOGLOBIN A1C  05/20/2022   COVID-19 Vaccine (7 - 2023-24 season) 09/17/2022   Medicare Annual Wellness (AWV)  10/09/2022   Diabetic kidney evaluation - eGFR measurement  11/16/2022   FOOT EXAM  05/18/2023   Diabetic kidney evaluation - Urine ACR  06/01/2023   OPHTHALMOLOGY EXAM  07/30/2023   Pneumonia Vaccine 81 Years old  Completed   INFLUENZA VACCINE  Completed   DEXA SCAN  Completed   HPV VACCINES  Aged Out    Health Maintenance  Health Maintenance Due  Topic Date Due   DTaP/Tdap/Td (1 - Tdap) Never done   Zoster Vaccines- Shingrix (1 of 2) Never done   HEMOGLOBIN A1C  05/20/2022   COVID-19 Vaccine  (7 - 2023-24 season) 09/17/2022   Medicare Annual Wellness (AWV)  10/09/2022   Diabetic kidney evaluation - eGFR measurement  11/16/2022    Colorectal cancer screening: No longer required.     Bone Density status: Completed 2020. Results reflect: Bone density results: NORMAL. Repeat every 5 years.  Lung Cancer Screening: (Low Dose CT Chest recommended if Age 81-80years, 30 pack-year currently smoking OR have quit w/in 15years.) does not qualify.   Lung Cancer Screening Referral: na  Additional Screening:  Hepatitis C Screening: does not qualify; Completed na  Vision Screening: Recommended annual ophthalmology exams for early detection of glaucoma and other disorders of the eye. Is the patient up to date with their annual eye exam?  Yes  Who is the provider or what is the name of the office in which the patient attends annual eye exams? [facility] If pt is not established with a provider, would they like to be referred to a provider to establish care? No .   Dental Screening: Recommended annual dental exams for proper oral hygiene  Community Resource Referral / Chronic Care Management: CRR required this visit?  No   CCM required this visit?  No      Plan:     I have personally reviewed and noted the following in the patient's chart:   Medical and social history Use of alcohol, tobacco or illicit drugs  Current medications and supplements including opioid prescriptions. Patient is not currently taking opioid prescriptions. Functional ability and status Nutritional status Physical activity Advanced directives List of other physicians Hospitalizations, surgeries, and ER visits in previous 12 months Vitals Screenings to include cognitive, depression, and falls Referrals and appointments  In addition, I have reviewed and discussed with patient certain preventive protocols, quality metrics, and best practice recommendations. A written personalized care plan  for preventive  services as well as general preventive health recommendations were provided to patient.     Lauree Chandler, NP   10/18/2022    Virtual Visit via Telephone Note  I connected with patient 10/18/22 at  1:00 PM EST by telephone and verified that I am speaking with the correct person using two identifiers.  Location: Patient: home Provider: [place]   I discussed the limitations, risks, security and privacy concerns of performing an evaluation and management service by telephone and the availability of in person appointments. I also discussed with the patient that there may be a patient responsible charge related to this service. The patient expressed understanding and agreed to proceed.   I discussed the assessment and treatment plan with the patient. The patient was provided an opportunity to ask questions and all were answered. The patient agreed with the plan and demonstrated an understanding of the instructions.   The patient was advised to call back or seek an in-person evaluation if the symptoms worsen or if the condition fails to improve as anticipated.  I provided 15  minutes of non-face-to-face time during this encounter.  Carlos American. Harle Battiest Avs printed and mailed

## 2022-11-18 ENCOUNTER — Ambulatory Visit (INDEPENDENT_AMBULATORY_CARE_PROVIDER_SITE_OTHER): Payer: Medicare HMO | Admitting: Nurse Practitioner

## 2022-11-18 ENCOUNTER — Encounter: Payer: Self-pay | Admitting: Nurse Practitioner

## 2022-11-18 VITALS — BP 138/86 | HR 110 | Temp 97.9°F | Ht 61.08 in | Wt 163.0 lb

## 2022-11-18 DIAGNOSIS — R000 Tachycardia, unspecified: Secondary | ICD-10-CM

## 2022-11-18 DIAGNOSIS — E1169 Type 2 diabetes mellitus with other specified complication: Secondary | ICD-10-CM

## 2022-11-18 DIAGNOSIS — M1711 Unilateral primary osteoarthritis, right knee: Secondary | ICD-10-CM | POA: Diagnosis not present

## 2022-11-18 DIAGNOSIS — I10 Essential (primary) hypertension: Secondary | ICD-10-CM

## 2022-11-18 DIAGNOSIS — M1A9XX0 Chronic gout, unspecified, without tophus (tophi): Secondary | ICD-10-CM

## 2022-11-18 MED ORDER — METOPROLOL TARTRATE 25 MG PO TABS: 25.0000 mg | ORAL_TABLET | Freq: Two times a day (BID) | ORAL | 0 refills | Status: DC

## 2022-11-18 NOTE — Patient Instructions (Addendum)
Stop HCTZ-  Monitor blood pressure at home goal <140/90.   Start metoprolol 25 mg by mouth twice daily due to elevated HR  Monitor HR and BP and record and bring to follow up visit.

## 2022-11-18 NOTE — Progress Notes (Signed)
Careteam: Patient Care Team: Lauree Chandler, NP as PCP - General (Geriatric Medicine)  PLACE OF SERVICE:  [place] Directive information Does Patient Have a Medical Advance Directive?: Yes, Type of Advance Directive: [place];Living will, Does patient want to make changes to medical advance directive?: No - Patient declined  Allergies  Allergen Reactions   Bactrim [Sulfamethoxazole-Trimethoprim] Diarrhea   Percocet [Oxycodone-Acetaminophen] Nausea And Vomiting   Clindamycin/Lincomycin Rash and Diarrhea    Terrible stomach cramps per patient   Penicillins Rash, Hives and Swelling    Chief Complaint  Patient presents with   Medical Management of Chronic Issues    6 month follow-up. Discuss need for additional covid boosters or post pone if patient refuses or is not a candidate.      HPI: Patient is a 82 y.o. female here for routine follow-up.  Checks blood sugars at home in the morning usually in the 90s-110s. Gets up to urinate a few times a night which is chronic and does not bother her at this time. No other issues with urination. Bowels moving regularly. Denies constipation or diarrhea.   BP runs about 120s/70s at home. Denies SOB, CP, palpitations. Occasionally feels like her heart is beating fast.   Cooks at home for the most part. Avoids sweets, doesn't keep them around the house. Drinks lots of water.   Very active around the home. No purposeful exercise.   R leg pain worse at night. Affecting sleep. Radiates to hip sometimes. Started around October. She has had a knee replacement in the right knee and reports that she has an awkward gait due to this and may be compensating which is causing the pain.   She follows up on eye exams regularly with the ophthalmologist.     Review of Systems:  Review of Systems  Constitutional:  Negative for chills, fever, malaise/fatigue and weight loss.  HENT:  Negative for congestion and sore  throat.   Eyes:  Negative for blurred vision.  Respiratory:  Negative for cough, shortness of breath and wheezing.   Cardiovascular:  Negative for chest pain, palpitations and leg swelling.  Gastrointestinal:  Negative for abdominal pain, blood in stool, constipation, diarrhea, heartburn, nausea and vomiting.  Genitourinary:  Negative for dysuria, frequency, hematuria and urgency.  Musculoskeletal:  Positive for myalgias. Negative for falls and joint pain.       R leg pain that is worse at night, sometimes present in R hip  Skin:  Negative for rash.  Neurological:  Negative for dizziness, tingling and headaches.  Endo/Heme/Allergies:  Negative for polydipsia.  Psychiatric/Behavioral:  Negative for depression. The patient is not nervous/anxious.     Past Medical History:  Diagnosis Date   Arthritis    DM (diabetes mellitus) ([place])    type 2   Gout    Hypercalcemia    Hypercholesteremia    Hypertension    PONV (postoperative nausea and vomiting)    Rheumatoid arteritis ([place])    Stroke [facility])    TIA 1994   Past Surgical History:  Procedure Laterality Date   bladder prolapse  2018   Dr. Ladean Raya   carpal tunnel Bilateral 1998   CATARACT EXTRACTION Bilateral 10/2020   DILATION AND CURETTAGE OF UTERUS  1964   Dr. Leonia Reader   MENISCUS REPAIR Right 2010   Dr. 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 Her smoking use included cigarettes. She has never used smokeless tobacco. She reports current alcohol use of about 1.0 - 2.0 standard drink of alcohol per week. She reports that she does not use drugs.  Family History  Problem Relation Age of Onset   Hypertension Son     Hypertension Son    Hypertension Son     Medications: Patient's Medications  New Prescriptions   No medications on file  Previous Medications   ACETAMINOPHEN (TYLENOL) 500 MG TABLET    Take 1,000 mg by mouth every 8 (eight) hours as needed for moderate pain.   ALLOPURINOL (ZYLOPRIM) 300 MG TABLET    TAKE 1 TABLET DAILY   AMLODIPINE (NORVASC) 5 MG TABLET    TAKE 1 TABLET DAILY   ASPIRIN EC 325 MG TABLET    Take 325 mg by mouth daily.   ATORVASTATIN (LIPITOR) 40 MG TABLET    TAKE 1 TABLET DAILY   BIOTIN 69629 MCG TABS    Take 10,000 mcg by mouth daily.   CHOLECALCIFEROL (VITAMIN D3) 50 MCG (2000 UT) CAPSULE    Take 2,000 Units by mouth daily.   FOLIC ACID (FOLVITE) 800 MCG TABLET    Take 800 mcg by mouth daily.    GLUCOSE BLOOD (ONETOUCH VERIO) TEST STRIP    Use to test Blood Sugar once daily. Dx: E11.9   HYDROCHLOROTHIAZIDE (HYDRODIURIL) 12.5 MG TABLET    Take 1 tablet (12.5 mg total) by mouth daily.   LANCETS (ONETOUCH DELICA PLUS LANCET33G) MISC    USE TO TEST BLOOD SUGAR    ONCE DAILY.   LATANOPROST (XALATAN) 0.005 % OPHTHALMIC SOLUTION    Place 1 drop into both eyes at bedtime.   LOSARTAN (COZAAR) 100 MG TABLET    TAKE 1 TABLET BY MOUTH EVERY DAY   METFORMIN (GLUCOPHAGE) 500 MG TABLET    TAKE 1 TABLET DAILY  Modified Medications   No medications on file  Discontinued Medications   [employer] ASPIRIN EC 325 MG EC TABLET    TAKE 1 TABLET (325 MG TOTAL) BY MOUTH 2 (TWO) TIMES DAILY AFTER A MEAL.    Physical Exam:  Vitals:   11/18/22 0928  BP: 138/86  Pulse: (!) 110  Temp: 97.9 F (36.6 C)  TempSrc: Temporal  SpO2: 98%  Weight: 163 lb (73.9 kg)  Height: 5' 1.08" (1.551 m)   Body mass index is 30.72 kg/m. Wt Readings from Last 3 Encounters:  11/18/22 163 lb (73.9 kg)  10/17/22 162 lb (73.5 kg)  05/17/22 164 lb (74.4 kg)    Physical Exam Vitals and nursing note reviewed. Exam conducted with a chaperone present.  Constitutional:      General: She is not in acute distress.     Appearance: Normal appearance.  Cardiovascular:     Rate and Rhythm: Regular rhythm. Tachycardia present.  Pulmonary:     Effort: No respiratory distress.     Breath sounds: Normal breath sounds.  Abdominal:     General: Bowel sounds are normal. There is no distension.     Palpations: Abdomen is soft. There is no mass.     Tenderness: There is no abdominal tenderness. There is no guarding.  Musculoskeletal:        General: Tenderness present.  Cervical back: Neck supple.     Comments: Limited ROM in R hip  Lymphadenopathy:     Cervical: No cervical adenopathy.  Skin:    General: Skin is warm and dry.  Neurological:     Mental Status: She is alert and oriented to person, place, and time.  Psychiatric:        Mood and Affect: Mood normal.    Labs reviewed: Basic Metabolic Panel: No results for input(s): "NA", "K", "CL", "CO2", "GLUCOSE", "BUN", "CREATININE", "CALCIUM", "MG", "PHOS", "TSH" in the last 8760 hours. Liver Function Tests: No results for input(s): "AST", "ALT", "ALKPHOS", "BILITOT", "PROT", "ALBUMIN" in the last 8760 hours. No results for input(s): "LIPASE", "AMYLASE" in the last 8760 hours. No results for input(s): "AMMONIA" in the last 8760 hours. CBC: Recent Labs    11/20/21 1049  WBC 8.6  NEUTROABS 4,721  HGB 15.2  HCT 45.2*  MCV 94.8  PLT 311   Lipid Panel: No results for input(s): "CHOL", "HDL", "LDLCALC", "TRIG", "CHOLHDL", "LDLDIRECT" in the last 8760 hours. TSH: No results for input(s): "TSH" in the last 8760 hours. A1C: Lab Results  Component Value Date   HGBA1C 5.5 11/20/2021     Assessment/Plan  1. Type 2 diabetes mellitus with other specified complication, without long-term current use of insulin (HCC) Patient monitors fasting AM blood sugars daily at home. Continue diet and lifestyle modifications, metformin, losartan.  -follow up A1c today - Microalbumin/Creatinine Ratio, Urine  2. Tachycardia Patient has a hx of sinus  tachycardia but HR is more elevated today. Denies chest pains, palpitations or shortness of breath. Discussed starting metoprolol with patient; she is in agreement to start a low dose now with close follow-up in 2 weeks. Pt educated to call the office should HR remain elevated or she experiences shortness of breath or chest pain. Verbalizes understanding.  - EKG 12-Lead- sinus tach, Right BBB chronic - TSH, CBC, CMP - metoprolol tartrate (LOPRESSOR) 25 MG tablet; Take 1 tablet (25 mg total) by mouth 2 (two) times daily.  Dispense: 60 tablet; Refill: 0  3. Hypercalcemia Stop hydrochlorothiazide today. Monitor. Follow up labs today.   4. Primary osteoarthritis of right knee Patient declines physical therapy or to return to surgeon for R leg pain at this time.   5. Chronic gout without tophus, unspecified cause, unspecified site Pt denies gout flare ups at this time. Continue allopurinol.  -follow up uric acid level today  6. Essential hypertension with goal blood pressure less than 140/90 Discussed stopping hydrochlorothiazide with patient due to nocturia which affects sleep. She is in agreement to start metoprolol which will assist in managing HR and BP at this time. Close follow-up in 2 weeks. She will monitor BP and HR at home. - metoprolol tartrate (LOPRESSOR) 25 MG tablet; Take 1 tablet (25 mg total) by mouth 2 (two) times daily.  Dispense: 60 tablet; Refill: 0  Return in about 2 weeks (around 12/02/2022) for bp and HR . Student- Archer Asa O'Berry ACPCNP-S  I personally was present during the history, physical exam and medical decision-making activities of this service and have verified that the service and findings are accurately documented in the student's note Zachary Nole K. [place], [facility] [phone]

## 2022-11-19 LAB — CBC WITH DIFFERENTIAL/PLATELET
Absolute Monocytes: 412 cells/uL (ref 200–950)
Basophils Absolute: 50 cells/uL (ref 0–200)
Basophils Relative: 0.6 %
Eosinophils Absolute: 0 cells/uL — ABNORMAL LOW (ref 15–500)
Eosinophils Relative: 0 %
HCT: 45.1 % — ABNORMAL HIGH (ref 35.0–45.0)
Hemoglobin: 15.5 g/dL (ref 11.7–15.5)
Lymphs Abs: 2243 cells/uL (ref 850–3900)
MCH: 32.6 pg (ref 27.0–33.0)
MCHC: 34.4 g/dL (ref 32.0–36.0)
MCV: 94.7 fL (ref 80.0–100.0)
MPV: 9.5 fL (ref 7.5–12.5)
Monocytes Relative: 4.9 %
Neutro Abs: 5695 cells/uL (ref 1500–7800)
Neutrophils Relative %: 67.8 %
Platelets: 287 10*3/uL (ref 140–400)
RBC: 4.76 10*6/uL (ref 3.80–5.10)
RDW: 12.5 % (ref 11.0–15.0)
Total Lymphocyte: 26.7 %
WBC: 8.4 10*3/uL (ref 3.8–10.8)

## 2022-11-19 LAB — LIPID PANEL
Cholesterol: 190 mg/dL (ref <200)
HDL: 72 mg/dL (ref >=50)
LDL Cholesterol (Calc): 91 mg/dL (calc)
Non-HDL Cholesterol (Calc): 118 mg/dL (calc) (ref <130)
Total CHOL/HDL Ratio: 2.6 (calc) (ref <5.0)
Triglycerides: 178 mg/dL — ABNORMAL HIGH (ref <150)

## 2022-11-19 LAB — HEMOGLOBIN A1C
Hgb A1c MFr Bld: 5.9 % of total Hgb — ABNORMAL HIGH (ref <5.7)
Mean Plasma Glucose: 123 mg/dL
eAG (mmol/L): 6.8 mmol/L

## 2022-11-19 LAB — TSH: TSH: 1.85 mIU/L (ref 0.40–4.50)

## 2022-11-19 LAB — MICROALBUMIN / CREATININE URINE RATIO
Creatinine, Urine: 19 mg/dL — ABNORMAL LOW (ref 20–275)
Microalb Creat Ratio: 63 mcg/mg creat — ABNORMAL HIGH (ref <30)
Microalb, Ur: 1.2 mg/dL

## 2022-11-19 LAB — COMPLETE METABOLIC PANEL WITH GFR
AG Ratio: 2 (calc) (ref 1.0–2.5)
ALT: 18 U/L (ref 6–29)
AST: 20 U/L (ref 10–35)
Albumin: 4.8 g/dL (ref 3.6–5.1)
Alkaline phosphatase (APISO): 96 U/L (ref 37–153)
BUN: 17 mg/dL (ref 7–25)
CO2: 24 mmol/L (ref 20–32)
Calcium: 11.6 mg/dL — ABNORMAL HIGH (ref 8.6–10.4)
Chloride: 103 mmol/L (ref 98–110)
Creat: 0.65 mg/dL (ref 0.60–0.95)
Globulin: 2.4 g/dL (calc) (ref 1.9–3.7)
Glucose, Bld: 100 mg/dL — ABNORMAL HIGH (ref 65–99)
Potassium: 4.1 mmol/L (ref 3.5–5.3)
Sodium: 141 mmol/L (ref 135–146)
Total Bilirubin: 0.8 mg/dL (ref 0.2–1.2)
Total Protein: 7.2 g/dL (ref 6.1–8.1)
eGFR: 88 mL/min/{1.73_m2} (ref >=60)

## 2022-11-19 LAB — VITAMIN D 25 HYDROXY (VIT D DEFICIENCY, FRACTURES): Vit D, 25-Hydroxy: 47 ng/mL (ref 30–100)

## 2022-11-19 LAB — PTH, INTACT AND CALCIUM
Calcium: 11.6 mg/dL — ABNORMAL HIGH (ref 8.6–10.4)
PTH: 64 pg/mL (ref 16–77)

## 2022-11-19 LAB — URIC ACID: Uric Acid, Serum: 4.1 mg/dL (ref 2.5–7.0)

## 2022-12-06 ENCOUNTER — Ambulatory Visit (INDEPENDENT_AMBULATORY_CARE_PROVIDER_SITE_OTHER): Payer: Medicare HMO | Admitting: Nurse Practitioner

## 2022-12-06 ENCOUNTER — Encounter: Payer: Self-pay | Admitting: Nurse Practitioner

## 2022-12-06 VITALS — BP 136/88 | HR 77 | Temp 97.2°F | Ht 61.0 in | Wt 167.0 lb

## 2022-12-06 DIAGNOSIS — R000 Tachycardia, unspecified: Secondary | ICD-10-CM | POA: Diagnosis not present

## 2022-12-06 DIAGNOSIS — I10 Essential (primary) hypertension: Secondary | ICD-10-CM | POA: Diagnosis not present

## 2022-12-06 MED ORDER — METOPROLOL TARTRATE 25 MG PO TABS: 12.5000 mg | ORAL_TABLET | Freq: Two times a day (BID) | ORAL | 1 refills | Status: DC

## 2022-12-06 NOTE — Patient Instructions (Signed)
To take half tablet of the metoprolol twice daily. Increase to whole tablet twice daily if HR staying over 90 If you increase please notify us.

## 2022-12-06 NOTE — Progress Notes (Signed)
Careteam: Patient Care Team: Lauree Chandler, NP as PCP - General (Geriatric Medicine)  PLACE OF SERVICE:  [place] Directive information Does Patient Have a Medical Advance Directive?: Yes, Type of Advance Directive: [place];Living will, Does patient want to make changes to medical advance directive?: No - Patient declined  Allergies  Allergen Reactions   Bactrim [Sulfamethoxazole-Trimethoprim] Diarrhea   Percocet [Oxycodone-Acetaminophen] Nausea And Vomiting   Clindamycin/Lincomycin Rash and Diarrhea    Terrible stomach cramps per patient   Penicillins Rash, Hives and Swelling    Chief Complaint  Patient presents with   Follow-up    2 week follow-up on blood pressure and heart rate. Discuss need for additional covid boosters or post pone if patient refuses. Patient with weight gain since starting metoprolol.      HPI: Patient is a 82 y.o. female for follow up on HR and BP.  She stopped HCTZ and started metoprolol. BP and HR well controlled on metoprolol.  Has had weight gain since started medication. She states she has not been eating more since starting medications.  Since stopping hctz she only gets up 2 times at night vs 4.  No worsening edema noted.  No shortness of breath or chest pains, or palpitations.  No increase in fatigue, dizziness or lightheaded.  HR 54-78 Bp 95-144/60s   Review of Systems:  Review of Systems  Constitutional:  Negative for chills, fever and weight loss.  HENT:  Negative for tinnitus.   Respiratory:  Negative for cough, sputum production and shortness of breath.   Cardiovascular:  Negative for chest pain, palpitations and leg swelling.  Gastrointestinal:  Negative for abdominal pain, constipation, diarrhea and heartburn.  Genitourinary:  Negative for dysuria, frequency and urgency.  Musculoskeletal:  Negative for back pain, falls, joint pain and myalgias.  Skin: Negative.   Neurological:  Negative  for dizziness and headaches.  Psychiatric/Behavioral:  Negative for depression and memory loss. The patient does not have insomnia.     Past Medical History:  Diagnosis Date   Arthritis    DM (diabetes mellitus) ([place])    type 2   Gout    Hypercalcemia    Hypercholesteremia    Hypertension    PONV (postoperative nausea and vomiting)    Rheumatoid arteritis ([place])    Stroke [facility])    TIA 1994   Past Surgical History:  Procedure Laterality Date   bladder prolapse  2018   Dr. Ladean Raya   carpal tunnel Bilateral 1998   CATARACT EXTRACTION Bilateral 10/2020   DILATION AND CURETTAGE OF UTERUS  1964   Dr. Leonia Reader   MENISCUS REPAIR Right 2010   Dr. Ladean Raya   PARTIAL HYSTERECTOMY  1973   ovaries remain   Stroke  1994   TIA   [place] Right 03/13/2021   Procedure: RIGHT TOTAL KNEE ARTHROPLASTY;  Surgeon: Mcarthur Rossetti, MD;  Location: [place];  Service: Orthopedics;  Laterality: Right;   Social History:   reports that she has quit smoking. Her smoking use included cigarettes. She has never used smokeless tobacco. She reports current alcohol use of about 1.0 - 2.0 standard drink of alcohol per week. She reports that she does not use drugs.  Family History  Problem Relation Age of Onset   Hypertension Son    Hypertension Son    Hypertension Son     Medications: Patient's Medications  New Prescriptions   No  medications on file  Previous Medications   ACETAMINOPHEN (TYLENOL) 500 MG TABLET    Take 1,000 mg by mouth every 8 (eight) hours as needed for moderate pain.   ALLOPURINOL (ZYLOPRIM) 300 MG TABLET    TAKE 1 TABLET DAILY   AMLODIPINE (NORVASC) 5 MG TABLET    TAKE 1 TABLET DAILY   ASPIRIN EC 325 MG TABLET    Take 325 mg by mouth daily.   ATORVASTATIN (LIPITOR) 40 MG TABLET    TAKE 1 TABLET DAILY   BIOTIN 16109 MCG TABS    Take 10,000 mcg by mouth daily.   CHOLECALCIFEROL (VITAMIN D3) 50 MCG (2000 UT) CAPSULE     Take 2,000 Units by mouth daily.   FOLIC ACID (FOLVITE) Q000111Q MCG TABLET    Take 800 mcg by mouth daily.    GLUCOSE BLOOD (ONETOUCH VERIO) TEST STRIP    Use to test Blood Sugar once daily. Dx: E11.9   LANCETS (ONETOUCH DELICA PLUS 123XX123) MISC    USE TO TEST BLOOD SUGAR    ONCE DAILY.   LATANOPROST (XALATAN) 0.005 % OPHTHALMIC SOLUTION    Place 1 drop into both eyes at bedtime.   LOSARTAN (COZAAR) 100 MG TABLET    TAKE 1 TABLET BY MOUTH EVERY DAY   METFORMIN (GLUCOPHAGE) 500 MG TABLET    TAKE 1 TABLET DAILY   METOPROLOL TARTRATE (LOPRESSOR) 25 MG TABLET    Take 1 tablet (25 mg total) by mouth 2 (two) times daily.  Modified Medications   No medications on file  Discontinued Medications   HYDROCHLOROTHIAZIDE (HYDRODIURIL) 12.5 MG TABLET    Take 1 tablet (12.5 mg total) by mouth daily.    Physical Exam:  Vitals:   12/06/22 1000 12/06/22 1044  BP: (!) 142/90 136/88  Pulse: 77   Temp: (!) 97.2 F (36.2 C)   TempSrc: Temporal   SpO2: 98%   Weight: 167 lb (75.8 kg)   Height: 5' 1"$  (1.549 m)    Body mass index is 31.55 kg/m. Wt Readings from Last 3 Encounters:  12/06/22 167 lb (75.8 kg)  11/18/22 163 lb (73.9 kg)  10/17/22 162 lb (73.5 kg)    Physical Exam Constitutional:      General: She is not in acute distress.    Appearance: She is well-developed. She is not diaphoretic.  HENT:     Head: Normocephalic and atraumatic.     Mouth/Throat:     Pharynx: No oropharyngeal exudate.  Eyes:     Conjunctiva/sclera: Conjunctivae normal.     Pupils: Pupils are equal, round, and reactive to light.  Cardiovascular:     Rate and Rhythm: Normal rate and regular rhythm.     Heart sounds: Normal heart sounds.  Pulmonary:     Effort: Pulmonary effort is normal.     Breath sounds: Normal breath sounds.  Abdominal:     General: Bowel sounds are normal.     Palpations: Abdomen is soft.  Musculoskeletal:     Cervical back: Normal range of motion and neck supple.     Right lower leg: No  edema.     Left lower leg: No edema.  Skin:    General: Skin is warm and dry.  Neurological:     Mental Status: She is alert.  Psychiatric:        Mood and Affect: Mood normal.     Labs reviewed: Basic Metabolic Panel: Recent Labs    11/18/22 1211  NA 141  K 4.1  CL  103  CO2 24  GLUCOSE 100*  BUN 17  CREATININE 0.65  CALCIUM 11.6*  11.6*  TSH 1.85   Liver Function Tests: Recent Labs    11/18/22 1211  AST 20  ALT 18  BILITOT 0.8  PROT 7.2   No results for input(s): "LIPASE", "AMYLASE" in the last 8760 hours. No results for input(s): "AMMONIA" in the last 8760 hours. CBC: Recent Labs    11/18/22 1211  WBC 8.4  NEUTROABS 5,695  HGB 15.5  HCT 45.1*  MCV 94.7  PLT 287   Lipid Panel: Recent Labs    11/18/22 1211  CHOL 190  HDL 72  LDLCALC 91  TRIG 178*  CHOLHDL 2.6   TSH: Recent Labs    11/18/22 1211  TSH 1.85   A1C: Lab Results  Component Value Date   HGBA1C 5.9 (H) 11/18/2022     Assessment/Plan 1. Tachycardia -improved on metoprolol. Will continue medication at reduced dose and have her monitor HR at home - BASIC METABOLIC PANEL WITH GFR - Brain Natriuretic Peptide - metoprolol tartrate (LOPRESSOR) 25 MG tablet; Take 0.5 tablets (12.5 mg total) by mouth 2 (two) times daily.  Dispense: 90 tablet; Refill: 1  2. Essential hypertension with goal blood pressure less than 140/90 -Blood pressure well controlled, goal bp <140/90 Continue current medications and dietary modifications follow metabolic panel - BASIC METABOLIC PANEL WITH GFR - Brain Natriuretic Peptide - metoprolol tartrate (LOPRESSOR) 25 MG tablet; Take 0.5 tablets (12.5 mg total) by mouth 2 (two) times daily.  Dispense: 90 tablet; Refill: 1    Return in about 6 months (around 06/06/2023) for routine follow up.  Carlos American. [place], [facility] [phone]

## 2022-12-07 LAB — BASIC METABOLIC PANEL WITH GFR
BUN: 17 mg/dL (ref 7–25)
CO2: 22 mmol/L (ref 20–32)
Calcium: 10.7 mg/dL — ABNORMAL HIGH (ref 8.6–10.4)
Chloride: 107 mmol/L (ref 98–110)
Creat: 0.64 mg/dL (ref 0.60–0.95)
Glucose, Bld: 97 mg/dL (ref 65–139)
Potassium: 4.6 mmol/L (ref 3.5–5.3)
Sodium: 141 mmol/L (ref 135–146)
eGFR: 89 mL/min/{1.73_m2} (ref >=60)

## 2022-12-16 ENCOUNTER — Other Ambulatory Visit: Payer: Self-pay | Admitting: Nurse Practitioner

## 2022-12-16 DIAGNOSIS — R000 Tachycardia, unspecified: Secondary | ICD-10-CM

## 2022-12-16 DIAGNOSIS — I10 Essential (primary) hypertension: Secondary | ICD-10-CM

## 2023-01-30 ENCOUNTER — Other Ambulatory Visit: Payer: Self-pay | Admitting: Nurse Practitioner

## 2023-01-30 DIAGNOSIS — I10 Essential (primary) hypertension: Secondary | ICD-10-CM

## 2023-02-05 ENCOUNTER — Other Ambulatory Visit: Payer: Self-pay | Admitting: Nurse Practitioner

## 2023-02-05 ENCOUNTER — Encounter: Payer: Self-pay | Admitting: Nurse Practitioner

## 2023-02-05 NOTE — Telephone Encounter (Signed)
I called patient to clarity who "They" is, whether she was referring to the mail order pharmacy or local in her mychart message. Patient stated that she was referring to the mail order however, I can disregard the message as it is all figured out now. The call was abruptly ended and I tried to call patient back to assure her that I did not hang up and there was no answer

## 2023-02-06 ENCOUNTER — Encounter: Payer: Self-pay | Admitting: Nurse Practitioner

## 2023-02-07 MED ORDER — ONETOUCH DELICA PLUS LANCET33G MISC: 3 refills | Status: DC

## 2023-04-10 ENCOUNTER — Other Ambulatory Visit: Payer: Self-pay | Admitting: Nurse Practitioner

## 2023-04-30 ENCOUNTER — Other Ambulatory Visit: Payer: Self-pay | Admitting: Nurse Practitioner

## 2023-06-01 ENCOUNTER — Other Ambulatory Visit: Payer: Self-pay | Admitting: Nurse Practitioner

## 2023-06-01 DIAGNOSIS — R000 Tachycardia, unspecified: Secondary | ICD-10-CM

## 2023-06-01 DIAGNOSIS — I10 Essential (primary) hypertension: Secondary | ICD-10-CM

## 2023-06-09 ENCOUNTER — Encounter: Payer: Self-pay | Admitting: Nurse Practitioner

## 2023-06-09 ENCOUNTER — Ambulatory Visit (INDEPENDENT_AMBULATORY_CARE_PROVIDER_SITE_OTHER): Payer: Medicare HMO | Admitting: Nurse Practitioner

## 2023-06-09 VITALS — BP 142/72 | HR 88 | Temp 97.8°F | Ht 61.5 in | Wt 166.6 lb

## 2023-06-09 DIAGNOSIS — E1169 Type 2 diabetes mellitus with other specified complication: Secondary | ICD-10-CM

## 2023-06-09 DIAGNOSIS — M1A9XX0 Chronic gout, unspecified, without tophus (tophi): Secondary | ICD-10-CM

## 2023-06-09 DIAGNOSIS — I10 Essential (primary) hypertension: Secondary | ICD-10-CM

## 2023-06-09 DIAGNOSIS — R000 Tachycardia, unspecified: Secondary | ICD-10-CM

## 2023-06-09 DIAGNOSIS — E782 Mixed hyperlipidemia: Secondary | ICD-10-CM

## 2023-06-09 DIAGNOSIS — M1711 Unilateral primary osteoarthritis, right knee: Secondary | ICD-10-CM

## 2023-06-09 NOTE — Progress Notes (Unsigned)
Careteam: Patient Care Team: Sharon Seller, NP as PCP - General (Geriatric Medicine)  PLACE OF SERVICE:  [facility]  Advanced Directive information Does Patient Have a Medical Advance Directive?: Yes, Type of Advance Directive: Healthcare Power of [place];Living will, Does patient want to make changes to medical advance directive?: No - Patient declined  Allergies  Allergen Reactions  . Bactrim [Sulfamethoxazole-Trimethoprim] Diarrhea  . Percocet [Oxycodone-Acetaminophen] Nausea And Vomiting  . Clindamycin/Lincomycin Rash and Diarrhea    Terrible stomach cramps per patient  . Penicillins Rash, Hives and Swelling    Chief Complaint  Patient presents with  . Medical Management of Chronic Issues    6 month follow-up and foot exam. Discuss need for A1c, covid booster, shingrix, and flu vaccine. Patient c/o hair loss, urinary issues (off/on, however not at current), and different feeling in left chest area. Fasting blood sugar this morning was 105.      HPI: Patient is a 82 y.o. female for routine follow up.   She sometimes has a pain when she urinates, feels like she may be getting a UTI but then it will go away.  Increase hydration and it improves.   She has had feeling in her chest, off and on for years. Reports a pain. States it is angina.  Sometimes at night and sometimes in the morning.  In the morning and at night when she is in bed she will feel it but not during the day or with activity.  In the left side of her chest. Knows its angina. Last a few seconds and then improves.  Had a stress test years ago when she lived in [place] reports this was probably 15-20 years ago.  She does not want to go to the cardiologist. Does not want to do a stress test.   Does not exercises but cleans her own house and does her own laundry. No pain or angina during this time.     Review of Systems:  ROS***  Past Medical History:  Diagnosis Date  . Arthritis   . DM (diabetes  mellitus) (HCC)    type 2  . Gout   . Hypercalcemia   . Hypercholesteremia   . Hypertension   . PONV (postoperative nausea and vomiting)   . Rheumatoid arteritis (HCC)   . Stroke [facility])    TIA 1994   Past Surgical History:  Procedure Laterality Date  . bladder prolapse  2018   Dr. Cranston Neighbor  . carpal tunnel Bilateral 1998  . CATARACT EXTRACTION Bilateral 10/2020  . DILATION AND CURETTAGE OF UTERUS  1964   Dr. Alease Medina  . MENISCUS REPAIR Right 2010   Dr. Cranston Neighbor  . PARTIAL HYSTERECTOMY  1973   ovaries remain  . Stroke  1994   TIA  . TONSILLECTOMY  1956  . TOTAL KNEE ARTHROPLASTY Right 03/13/2021   Procedure: RIGHT TOTAL KNEE ARTHROPLASTY;  Surgeon: Kathryne Hitch, MD;  [facility];  Service: Orthopedics;  Laterality: Right;   Social History:   reports that she has quit smoking. Her smoking use included cigarettes. She has never used smokeless tobacco. She reports current alcohol use of about 1.0 - 2.0 standard drink of alcohol per week. She reports that she does not use drugs.  Family History  Problem Relation Age of Onset  . Hypertension Son   . Hypertension Son   . Hypertension Son     Medications: Patient's Medications  New Prescriptions   No medications on file  Previous Medications   ACETAMINOPHEN (TYLENOL) 500 MG TABLET    Take 1,000 mg by mouth every 8 (eight) hours as needed for moderate pain.   ALLOPURINOL (ZYLOPRIM) 300 MG TABLET    TAKE 1 TABLET DAILY   AMLODIPINE (NORVASC) 5 MG TABLET    TAKE 1 TABLET DAILY   ASPIRIN EC 325 MG TABLET    Take 325 mg by mouth daily.   ATORVASTATIN (LIPITOR) 40 MG TABLET    TAKE 1 TABLET DAILY   BIOTIN 28413 MCG TABS    Take 10,000 mcg by mouth daily.   CHOLECALCIFEROL (VITAMIN D3) 50 MCG (2000 UT) CAPSULE    Take 2,000 Units by mouth daily.   FOLIC ACID (FOLVITE) 800 MCG TABLET    Take 800 mcg by mouth daily.    GLUCOSE BLOOD (ONETOUCH VERIO) TEST STRIP    USE TO TEST BLOOD SUGAR    ONCE DAILY    LANCETS (ONETOUCH DELICA PLUS LANCET33G) MISC    Use to check blood sugar once daily.   LATANOPROST (XALATAN) 0.005 % OPHTHALMIC SOLUTION    Place 1 drop into both eyes at bedtime.   LOSARTAN (COZAAR) 100 MG TABLET    TAKE 1 TABLET BY MOUTH EVERY DAY   METFORMIN (GLUCOPHAGE) 500 MG TABLET    TAKE 1 TABLET DAILY   METOPROLOL TARTRATE (LOPRESSOR) 25 MG TABLET    TAKE 0.5 TABLETS BY MOUTH 2 TIMES DAILY.  Modified Medications   No medications on file  Discontinued Medications   No medications on file    Physical Exam:  Vitals:   06/09/23 1051 06/09/23 1054  BP: (!) 146/70 (!) 142/72  Pulse: 88   Temp: 97.8 F (36.6 C)   TempSrc: Temporal   SpO2: 96%   Weight: 166 lb 9.6 oz (75.6 kg)   Height: 5' 1.5" (1.562 m)    Body mass index is 30.97 kg/m. Wt Readings from Last 3 Encounters:  06/09/23 166 lb 9.6 oz (75.6 kg)  12/06/22 167 lb (75.8 kg)  11/18/22 163 lb (73.9 kg)    Physical Exam***  Labs reviewed: Basic Metabolic Panel: Recent Labs    11/18/22 1211 12/06/22 1105  NA 141 141  K 4.1 4.6  CL 103 107  CO2 24 22  GLUCOSE 100* 97  BUN 17 17  CREATININE 0.65 0.64  CALCIUM 11.6*  11.6* 10.7*  TSH 1.85  --    Liver Function Tests: Recent Labs    11/18/22 1211  AST 20  ALT 18  BILITOT 0.8  PROT 7.2   No results for input(s): "LIPASE", "AMYLASE" in the last 8760 hours. No results for input(s): "AMMONIA" in the last 8760 hours. CBC: Recent Labs    11/18/22 1211  WBC 8.4  NEUTROABS 5,695  HGB 15.5  HCT 45.1*  MCV 94.7  PLT 287   Lipid Panel: Recent Labs    11/18/22 1211  CHOL 190  HDL 72  LDLCALC 91  TRIG 178*  CHOLHDL 2.6   TSH: Recent Labs    11/18/22 1211  TSH 1.85   A1C: Lab Results  Component Value Date   HGBA1C 5.9 (H) 11/18/2022     Assessment/Plan There are no diagnoses linked to this encounter.  No follow-ups on file.: ***  Bashir Marchetti K. [facility] [phone]

## 2023-06-10 ENCOUNTER — Encounter: Payer: Self-pay | Admitting: Nurse Practitioner

## 2023-06-10 LAB — LIPID PANEL
Cholesterol: 167 mg/dL (ref <200)
HDL: 70 mg/dL (ref >=50)
LDL Cholesterol (Calc): 75 mg/dL
Non-HDL Cholesterol (Calc): 97 mg/dL (ref <130)
Total CHOL/HDL Ratio: 2.4 (calc) (ref <5.0)
Triglycerides: 137 mg/dL (ref <150)

## 2023-06-10 LAB — CBC WITH DIFFERENTIAL/PLATELET
Absolute Monocytes: 407 {cells}/uL (ref 200–950)
Basophils Absolute: 42 {cells}/uL (ref 0–200)
Basophils Relative: 0.5 %
Eosinophils Absolute: 8 {cells}/uL — ABNORMAL LOW (ref 15–500)
Eosinophils Relative: 0.1 %
HCT: 45.1 % — ABNORMAL HIGH (ref 35.0–45.0)
Hemoglobin: 14.7 g/dL (ref 11.7–15.5)
Lymphs Abs: 2274 {cells}/uL (ref 850–3900)
MCH: 32 pg (ref 27.0–33.0)
MCHC: 32.6 g/dL (ref 32.0–36.0)
MCV: 98 fL (ref 80.0–100.0)
MPV: 10 fL (ref 7.5–12.5)
Monocytes Relative: 4.9 %
Neutro Abs: 5569 {cells}/uL (ref 1500–7800)
Neutrophils Relative %: 67.1 %
Platelets: 263 10*3/uL (ref 140–400)
RBC: 4.6 10*6/uL (ref 3.80–5.10)
RDW: 12.6 % (ref 11.0–15.0)
Total Lymphocyte: 27.4 %
WBC: 8.3 10*3/uL (ref 3.8–10.8)

## 2023-06-10 LAB — COMPLETE METABOLIC PANEL WITH GFR
AG Ratio: 2.4 (calc) (ref 1.0–2.5)
ALT: 19 U/L (ref 6–29)
AST: 18 U/L (ref 10–35)
Albumin: 4.8 g/dL (ref 3.6–5.1)
Alkaline phosphatase (APISO): 102 U/L (ref 37–153)
BUN: 14 mg/dL (ref 7–25)
CO2: 27 mmol/L (ref 20–32)
Calcium: 11.2 mg/dL — ABNORMAL HIGH (ref 8.6–10.4)
Chloride: 105 mmol/L (ref 98–110)
Creat: 0.61 mg/dL (ref 0.60–0.95)
Globulin: 2 g/dL (ref 1.9–3.7)
Glucose, Bld: 111 mg/dL — ABNORMAL HIGH (ref 65–99)
Potassium: 4.5 mmol/L (ref 3.5–5.3)
Sodium: 141 mmol/L (ref 135–146)
Total Bilirubin: 1.2 mg/dL (ref 0.2–1.2)
Total Protein: 6.8 g/dL (ref 6.1–8.1)
eGFR: 89 mL/min/{1.73_m2} (ref >=60)

## 2023-06-10 LAB — HEMOGLOBIN A1C
Hgb A1c MFr Bld: 5.9 %{Hb} — ABNORMAL HIGH (ref <5.7)
Mean Plasma Glucose: 123 mg/dL
eAG (mmol/L): 6.8 mmol/L

## 2023-07-21 ENCOUNTER — Encounter: Payer: Self-pay | Admitting: Nurse Practitioner

## 2023-07-21 LAB — HM DIABETES EYE EXAM

## 2023-07-29 ENCOUNTER — Other Ambulatory Visit: Payer: Self-pay | Admitting: Nurse Practitioner

## 2023-07-29 DIAGNOSIS — I10 Essential (primary) hypertension: Secondary | ICD-10-CM

## 2023-08-28 ENCOUNTER — Other Ambulatory Visit: Payer: Self-pay | Admitting: Nurse Practitioner

## 2023-09-26 ENCOUNTER — Other Ambulatory Visit: Payer: Self-pay | Admitting: Nurse Practitioner

## 2023-10-24 ENCOUNTER — Ambulatory Visit (INDEPENDENT_AMBULATORY_CARE_PROVIDER_SITE_OTHER): Payer: Medicare HMO | Admitting: Nurse Practitioner

## 2023-10-24 VITALS — BP 146/90 | HR 85 | Temp 97.9°F | Ht 62.0 in | Wt 166.0 lb

## 2023-10-24 DIAGNOSIS — Z0000 Encounter for general adult medical examination without abnormal findings: Secondary | ICD-10-CM | POA: Diagnosis not present

## 2023-10-24 DIAGNOSIS — E2839 Other primary ovarian failure: Secondary | ICD-10-CM

## 2023-10-24 NOTE — Patient Instructions (Addendum)
  Debra Madden , Thank you for taking time to come for your Medicare Wellness Visit. I appreciate your ongoing commitment to your health goals. Please review the following plan we discussed and let me know if I can assist you in the future.   For bone density call [phone] [facility] [address] [place],  [place]  [address]  This is a list of the screening recommended for you and due dates:  Health Maintenance  Topic Date Due   Zoster (Shingles) Vaccine (1 of 2) [birth year]1960   COVID-19 Vaccine (8 - 2024-25 season) 08/17/2023   Yearly kidney health urinalysis for diabetes  11/19/2023   Hemoglobin A1C  12/10/2023   Yearly kidney function blood test for diabetes  06/08/2024   Complete foot exam   06/08/2024   Eye exam for diabetics  07/20/2024   Medicare Annual Wellness Visit  10/23/2024   Pneumonia Vaccine  Completed   Flu Shot  Completed   DEXA scan (bone density measurement)  Completed   HPV Vaccine  Aged Out   DTaP/Tdap/Td vaccine  Discontinued

## 2023-10-24 NOTE — Progress Notes (Signed)
 Subjective:   Debra Madden is a 83 y.o. female who presents for Medicare Annual (Subsequent) preventive examination.  Visit Complete: In person at [facility]   Cardiac Risk Factors include: advanced age (>70men, >65 women);dyslipidemia;hypertension;diabetes mellitus;obesity (BMI >30kg/m2)     Objective:    Today's Vitals   10/24/23 1308 10/24/23 1310  BP: (!) 144/90 (!) 146/90  Pulse: 85   Temp: 97.9 F (36.6 C)   TempSrc: Temporal   SpO2: 97%   Weight: 166 lb (75.3 kg)   Height: 5' 2 (1.575 m)    Body mass index is 30.36 kg/m.     10/24/2023    1:07 PM 06/09/2023   10:51 AM 12/06/2022   10:00 AM 11/18/2022    9:28 AM 10/18/2022   12:42 PM 10/17/2022    3:35 PM 05/17/2022   10:41 AM  Advanced Directives  Does Patient Have a Medical Advance Directive? Yes Yes Yes Yes Yes Yes Yes  Type of Estate Agent of [place];Living will Healthcare Power of [place];Living will Healthcare Power of [place];Living will Healthcare Power of [place];Living will Healthcare Power of [place];Living will Healthcare Power of [place];Living will Healthcare Power of [place];Living will  Does patient want to make changes to medical advance directive? No - Patient declined No - Patient declined No - Patient declined No - Patient declined No - Patient declined No - Patient declined No - Patient declined  Copy of Healthcare Power of Attorney in Chart? Yes - validated most recent copy scanned in chart (See row information) Yes - validated most recent copy scanned in chart (See row information) Yes - validated most recent copy scanned in chart (See row information) Yes - validated most recent copy scanned in chart (See row information) Yes - validated most recent copy scanned in chart (See row information) Yes - validated most recent copy scanned in chart (See row information) Yes - validated most recent copy scanned in chart (See row information)    Current Medications  (verified) Outpatient Encounter Medications as of 10/24/2023  Medication Sig   acetaminophen  (TYLENOL ) 500 MG tablet Take 1,000 mg by mouth every 8 (eight) hours as needed for moderate pain.   allopurinol  (ZYLOPRIM ) 300 MG tablet TAKE 1 TABLET DAILY   amLODipine  (NORVASC ) 5 MG tablet TAKE 1 TABLET DAILY   aspirin  EC 325 MG tablet Take 325 mg by mouth daily.   atorvastatin  (LIPITOR) 40 MG tablet TAKE 1 TABLET DAILY   Biotin  10000 MCG TABS Take 10,000 mcg by mouth daily.   Cholecalciferol  (VITAMIN D3) 50 MCG (2000 UT) capsule Take 2,000 Units by mouth daily.   folic acid  (FOLVITE ) 800 MCG tablet Take 800 mcg by mouth daily.    glucose blood (ONETOUCH VERIO) test strip USE TO TEST BLOOD SUGAR    ONCE DAILY   Lancets (ONETOUCH DELICA PLUS LANCET33G) MISC Use to check blood sugar once daily.   latanoprost  (XALATAN ) 0.005 % ophthalmic solution Place 1 drop into both eyes at bedtime.   losartan  (COZAAR ) 100 MG tablet TAKE 1 TABLET BY MOUTH EVERY DAY   metFORMIN  (GLUCOPHAGE ) 500 MG tablet TAKE 1 TABLET DAILY   metoprolol  tartrate (LOPRESSOR ) 25 MG tablet TAKE 0.5 TABLETS BY MOUTH 2 TIMES DAILY.   No facility-administered encounter medications on file as of 10/24/2023.    Allergies (verified) Bactrim  [sulfamethoxazole -trimethoprim ], Percocet [oxycodone -acetaminophen ], Clindamycin/lincomycin, and Penicillins   History: Past Medical History:  Diagnosis Date   Arthritis    DM (diabetes mellitus) (HCC)    type 2  Gout    Hypercalcemia    Hypercholesteremia    Hypertension    PONV (postoperative nausea and vomiting)    Rheumatoid arteritis (HCC)    Stroke [facility])    TIA 1994   Past Surgical History:  Procedure Laterality Date   bladder prolapse  2018   Dr. Jinnie Ferron   carpal tunnel Bilateral 1998   CATARACT EXTRACTION Bilateral 10/2020   DILATION AND CURETTAGE OF UTERUS  1964   Dr. Addie Lunger   MENISCUS REPAIR Right 2010   Dr. 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   Procedure: RIGHT TOTAL KNEE ARTHROPLASTY;  Surgeon: Vernetta Lonni GRADE, MD;  Location: [facility] OR;  Service: Orthopedics;  Laterality: Right;   Family History  Problem Relation Age of Onset   Hypertension Son    Hypertension Son    Hypertension Son    Social History   Socioeconomic History   Marital status: Married    Spouse name: Not on file   Number of children: Not on file   Years of education: Not on file   Highest education level: Not on file  Occupational History   Not on file  Tobacco Use   Smoking status: Former    Types: Cigarettes   Smokeless tobacco: Never   Tobacco comments:    Quit at age 75  Vaping Use   Vaping status: Never Used  Substance and Sexual Activity   Alcohol use: Yes    Alcohol/week: 1.0 - 2.0 standard drink of alcohol    Types: 1 - 2 Glasses of wine per week   Drug use: Never   Sexual activity: Not Currently  Other Topics Concern   Not on file  Social History Narrative   Social History      Diet? Meats, fish, salads, occasionally shellfish, eggs, vegs, broc, cauliflower, brusel sprouts, green beans, mixed vegs, potatoes, some pasta/rice.       Do you drink/eat things with caffeine? Coffee- 1 1/2 cup per day      Marital status?           married                         What year were you married? 1964      Do you live in a house, apartment, assisted living, condo, trailer, etc.? house      Is it one or more stories? One- no stairs      How many persons live in your home? 2      Do you have any pets in your home? (please list) no      Highest level of education completed? 4 year college degree      Current or past profession: teacher      Do you exercise?         yes                             Type & how often? Walk, water aerobics      Advanced Directives      Do you have a living will? yes      Do you have a DNR form?         no  If not, do you want to discuss one?      Do you have signed POA/HPOA for forms? yes      Functional Status      Do you have difficulty bathing or dressing yourself? no      Do you have difficulty preparing food or eating? no      Do you have difficulty managing your medications? no      Do you have difficulty managing your finances? no      Do you have difficulty affording your medications? no   Social Drivers of Corporate Investment Banker Strain: Low Risk  (09/23/2018)   Overall Financial Resource Strain (CARDIA)    Difficulty of Paying Living Expenses: Not hard at all  Food Insecurity: No Food Insecurity (09/23/2018)   Hunger Vital Sign    Worried About Running Out of Food in the Last Year: Never true    Ran Out of Food in the Last Year: Never true  Transportation Needs: No Transportation Needs (09/23/2018)   PRAPARE - Administrator, Civil Service (Medical): No    Lack of Transportation (Non-Medical): No  Physical Activity: Inactive (09/23/2018)   Exercise Vital Sign    Days of Exercise per Week: 0 days    Minutes of Exercise per Session: 0 min  Stress: No Stress Concern Present (09/23/2018)   [employer] of Occupational Health - Occupational Stress Questionnaire    Feeling of Stress : Only a little  Social Connections: Moderately Integrated (09/23/2018)   Social Connection and Isolation Panel [NHANES]    Frequency of Communication with Friends and Family: Three times a week    Frequency of Social Gatherings with Friends and Family: Once a week    Attends Religious Services: More than 4 times per year    Active Member of [employer] or Organizations: No    Attends Engineer, Structural: Never    Marital Status: Married    Tobacco Counseling Counseling given: Not Answered Tobacco comments: Quit at age 36   Clinical Intake:  Pre-visit preparation completed: Yes  Pain : No/denies pain     BMI - recorded:  30 Diabetes: No  How often do you need to have someone help you when you read instructions, pamphlets, or other written materials from your doctor or pharmacy?: 1 - Never         Activities of Daily Living    10/24/2023    1:22 PM  In your present state of health, do you have any difficulty performing the following activities:  Hearing? 0  Vision? 0  Difficulty concentrating or making decisions? 0  Walking or climbing stairs? 0  Dressing or bathing? 0  Doing errands, shopping? 0  Preparing Food and eating ? N  Using the Toilet? N  In the past six months, have you accidently leaked urine? N  Do you have problems with loss of bowel control? N  Managing your Medications? N  Managing your Finances? N  Housekeeping or managing your Housekeeping? N    Patient Care Team: Caro Harlene POUR, NP as PCP - General (Geriatric Medicine)  Indicate any recent Medical Services you may have received from other than [facility] providers in the past year (date may be approximate).     Assessment:   This is a routine wellness examination for Debra Madden.  Hearing/Vision screen Hearing Screening - Comments:: No hearing issues  Vision Screening - Comments:: Pending eye exam with Dr.Bowen in March 2025  Goals Addressed   None    Depression Screen    10/24/2023    1:06 PM 06/09/2023   10:48 AM 10/18/2022   12:43 PM 11/16/2021   10:59 AM 10/09/2021    8:59 AM 10/03/2020   10:20 AM 05/15/2020    9:58 AM  PHQ 2/9 Scores  PHQ - 2 Score 0 0 0 0 0 0 0    Fall Risk    10/24/2023    1:06 PM 06/09/2023   10:48 AM 11/18/2022   11:17 AM 10/18/2022   12:43 PM 05/17/2022   10:40 AM  Fall Risk   Falls in the past year? 0 0 0 0 0  Number falls in past yr: 0 0 0 0 0  Injury with Fall? 0 0 0 0 0  Risk for fall due to : No Fall Risks No Fall Risks No Fall Risks No Fall Risks No Fall Risks  Follow up Falls evaluation completed Falls evaluation completed Falls evaluation completed Falls evaluation completed  Falls evaluation completed    MEDICARE RISK AT HOME: Medicare Risk at Home Any stairs in or around the home?: No Home free of loose throw rugs in walkways, pet beds, electrical cords, etc?: No Adequate lighting in your home to reduce risk of falls?: Yes Life alert?: No Use of a cane, walker or w/c?: No Grab bars in the bathroom?: Yes Shower chair or bench in shower?: Yes Elevated toilet seat or a handicapped toilet?: Yes  TIMED UP AND GO:  Was the test performed?  No    Cognitive Function:    10/24/2023    1:14 PM 09/23/2018    9:14 AM  MMSE - Mini Mental State Exam  Orientation to time 5 5  Orientation to Place 5 5  Registration 3 3  Attention/ Calculation 3 5  Attention/Calculation-comments mixed up o and r   Recall 2 2  Recall-comments pissed penny, said pear instead   Language- name 2 objects 2 2  Language- repeat 1 1  Language- follow 3 step command 3 3  Language- read & follow direction 1 1  Write a sentence 1 1  Copy design 1 1  Total score 27 29        10/18/2022   12:43 PM 10/09/2021    9:03 AM 10/03/2020   10:24 AM 09/27/2019    8:33 AM  6CIT Screen  What Year? 0 points 0 points 0 points 0 points  What month? 0 points 0 points 0 points 0 points  What time? 0 points 0 points 0 points 0 points  Count back from 20 0 points 0 points 0 points 0 points  Months in reverse 0 points 0 points 0 points 0 points  Repeat phrase 0 points 0 points 0 points 2 points  Total Score 0 points 0 points 0 points 2 points    Immunizations Immunization History  Administered Date(s) Administered   Fluad Quad(high Dose 65+) 07/17/2017, 07/10/2020, 07/16/2022   Influenza Split 07/20/2012   Influenza, High Dose Seasonal PF 07/06/2014, 07/04/2016, 07/17/2017, 06/22/2019, 07/16/2022, 06/22/2023   Influenza, Quadrivalent, Recombinant, Inj, Pf 07/07/2013   Influenza, Seasonal, Injecte, Preservative Fre 08/11/2015   Influenza-Unspecified 06/21/2018, 06/16/2019, 06/26/2021    Moderna SARS-COV2 Booster Vaccination 07/23/2022   PFIZER(Purple Top)SARS-COV-2 Vaccination 11/05/2019, 11/26/2019, 08/07/2020, 05/11/2021   Pfizer Covid-19 Vaccine Bivalent Booster 13yrs & up 08/22/2021, 03/08/2022, 03/08/2022   Pneumococcal Conjugate-13 04/06/2015, 09/05/2016   Pneumococcal Polysaccharide-23 08/05/2012, 10/21/2014   Unspecified SARS-COV-2 Vaccination 06/22/2023   Zoster, Live  09/05/2016    TDAP status: Up to date  Flu Vaccine status: Due, Education has been provided regarding the importance of this vaccine. Advised may receive this vaccine at local pharmacy or Health Dept. Aware to provide a copy of the vaccination record if obtained from local pharmacy or Health Dept. Verbalized acceptance and understanding.  Pneumococcal vaccine status: Up to date  Covid-19 vaccine status: Information provided on how to obtain vaccines.   Qualifies for Shingles Vaccine? Yes   Zostavax completed No   Shingrix Completed?: No.    Education has been provided regarding the importance of this vaccine. Patient has been advised to call insurance company to determine out of pocket expense if they have not yet received this vaccine. Advised may also receive vaccine at local pharmacy or Health Dept. Verbalized acceptance and understanding.  Screening Tests Health Maintenance  Topic Date Due   COVID-19 Vaccine (8 - 2024-25 season) 08/17/2023   Diabetic kidney evaluation - Urine ACR  11/19/2023   Zoster Vaccines- Shingrix (1 of 2) 10/23/2024 (Originally 04/21/1960)   HEMOGLOBIN A1C  12/10/2023   Diabetic kidney evaluation - eGFR measurement  06/08/2024   FOOT EXAM  06/08/2024   OPHTHALMOLOGY EXAM  07/20/2024   Medicare Annual Wellness (AWV)  10/23/2024   Pneumonia Vaccine 94+ Years old  Completed   INFLUENZA VACCINE  Completed   DEXA SCAN  Completed   HPV VACCINES  Aged Out   DTaP/Tdap/Td  Discontinued    Health Maintenance  Health Maintenance Due  Topic Date Due   COVID-19 Vaccine (8  - 2024-25 season) 08/17/2023   Diabetic kidney evaluation - Urine ACR  11/19/2023    Colorectal cancer screening: No longer required.   Mammogram status: No longer required due to age.  Bone Density status: Completed 2020. Results reflect: Bone density results: NORMAL. Repeat every 5 years.  Lung Cancer Screening: (Low Dose CT Chest recommended if Age 83-80 years, 20 pack-year currently smoking OR have quit w/in 15years.) does not qualify.   Lung Cancer Screening Referral: na  Additional Screening:  Hepatitis C Screening: does not qualify; Completed   Vision Screening: Recommended annual ophthalmology exams for early detection of glaucoma and other disorders of the eye. Is the patient up to date with their annual eye exam?  Yes  Who is the provider or what is the name of the office in which the patient attends annual eye exams? Dr Waylan If pt is not established with a provider, would they like to be referred to a provider to establish care? No .   Dental Screening: Recommended annual dental exams for proper oral hygiene  Diabetic Foot Exam: Diabetic Foot Exam: Completed 06/09/2023  Community Resource Referral / Chronic Care Management: CRR required this visit?  No   CCM required this visit?  No     Plan:     I have personally reviewed and noted the following in the patient's chart:   Medical and social history Use of alcohol, tobacco or illicit drugs  Current medications and supplements including opioid prescriptions. Patient is not currently taking opioid prescriptions. Functional ability and status Nutritional status Physical activity Advanced directives List of other physicians Hospitalizations, surgeries, and ER visits in previous 12 months Vitals Screenings to include cognitive, depression, and falls Referrals and appointments  In addition, I have reviewed and discussed with patient certain preventive protocols, quality metrics, and best practice  recommendations. A written personalized care plan for preventive services as well as general preventive health recommendations were provided  to patient.     Harlene MARLA An, NP   10/24/2023   After Visit Summary: (In Person-Printed) AVS printed and given to the patient

## 2023-11-30 ENCOUNTER — Other Ambulatory Visit: Payer: Self-pay | Admitting: Nurse Practitioner

## 2023-11-30 DIAGNOSIS — R000 Tachycardia, unspecified: Secondary | ICD-10-CM

## 2023-11-30 DIAGNOSIS — I10 Essential (primary) hypertension: Secondary | ICD-10-CM

## 2023-12-07 ENCOUNTER — Other Ambulatory Visit: Payer: Self-pay | Admitting: Nurse Practitioner

## 2023-12-07 DIAGNOSIS — I10 Essential (primary) hypertension: Secondary | ICD-10-CM

## 2023-12-08 ENCOUNTER — Ambulatory Visit (INDEPENDENT_AMBULATORY_CARE_PROVIDER_SITE_OTHER): Payer: Medicare HMO | Admitting: Nurse Practitioner

## 2023-12-08 VITALS — BP 142/82 | HR 82 | Temp 97.8°F | Resp 17 | Ht 62.0 in | Wt 165.0 lb

## 2023-12-08 DIAGNOSIS — I10 Essential (primary) hypertension: Secondary | ICD-10-CM

## 2023-12-08 DIAGNOSIS — E1169 Type 2 diabetes mellitus with other specified complication: Secondary | ICD-10-CM

## 2023-12-08 DIAGNOSIS — M1A9XX0 Chronic gout, unspecified, without tophus (tophi): Secondary | ICD-10-CM | POA: Diagnosis not present

## 2023-12-08 DIAGNOSIS — E782 Mixed hyperlipidemia: Secondary | ICD-10-CM

## 2023-12-08 DIAGNOSIS — E66811 Obesity, class 1: Secondary | ICD-10-CM

## 2023-12-08 DIAGNOSIS — I2089 Other forms of angina pectoris: Secondary | ICD-10-CM

## 2023-12-08 DIAGNOSIS — R000 Tachycardia, unspecified: Secondary | ICD-10-CM | POA: Diagnosis not present

## 2023-12-08 DIAGNOSIS — Z6830 Body mass index (BMI) 30.0-30.9, adult: Secondary | ICD-10-CM

## 2023-12-08 DIAGNOSIS — M1711 Unilateral primary osteoarthritis, right knee: Secondary | ICD-10-CM

## 2023-12-08 DIAGNOSIS — E6609 Other obesity due to excess calories: Secondary | ICD-10-CM

## 2023-12-08 NOTE — Progress Notes (Signed)
 Careteam: Patient Care Team: Debra Seller, NP as PCP - General (Geriatric Medicine)  PLACE OF SERVICE:  [facility]  Advanced Directive information Does Patient Have a Medical Advance Directive?: Yes, Type of Advance Directive: Healthcare Power of [address];Living will, Does patient want to make changes to medical advance directive?: No - Patient declined  Allergies  Allergen Reactions   Bactrim [Sulfamethoxazole-Trimethoprim] Diarrhea   Percocet [Oxycodone-Acetaminophen] Nausea And Vomiting   Clindamycin/Lincomycin Rash and Diarrhea    Terrible stomach cramps per patient   Penicillins Rash, Hives and Swelling    Chief Complaint  Patient presents with   Medical Management of Chronic Issues    Patient is being seen for a 6 month follow up    HPI: pt is a 83 y.o. female for routine follow up  Discussed the use of AI scribe software for clinical note transcription with the patient, who gave verbal consent to proceed.  History of Present Illness   She has hypertension with well-controlled blood pressure readings at home, ranging from 100 to 130 systolic over 50 to 70 diastolic at various times of the day. She continues to take metoprolol, which has effectively managed her angina symptoms.  She experiences occasional angina twinges, typically in the mornings, including on the day of the visit. These symptoms have been present for years. She has previously seen a cardiologist and undergone a stress test, which was normal. Has not seen cardiologist recently.  At last visit had an elevated heart rate during her last visit, which has since resolved.   She is currently on metformin for type 2 diabetes mellitus and has no episodes of hypoglycemia. Her last A1c was 5.9 and due for update today.  No worsening knee pain is reported.  She has a scheduled bone density scan for September 3rd 2025.  She reports increased sensitivity to cold over the past several years, attributing it to  aging. No falls, ear pain, decreased hearing, facial twinges, or muscle twitches are reported.  Her weight has been stable, and her BMI is 30.      Review of Systems:  Review of Systems  Constitutional:  Negative for chills, fever and weight loss.  HENT:  Negative for tinnitus.   Respiratory:  Negative for cough, sputum production and shortness of breath.   Cardiovascular:  Negative for chest pain, palpitations and leg swelling.  Gastrointestinal:  Negative for abdominal pain, constipation, diarrhea and heartburn.  Genitourinary:  Negative for dysuria, frequency and urgency.  Musculoskeletal:  Negative for back pain, falls, joint pain and myalgias.  Skin: Negative.   Neurological:  Negative for dizziness and headaches.  Psychiatric/Behavioral:  Negative for depression and memory loss. The patient does not have insomnia.     Past Medical History:  Diagnosis Date   Arthritis    DM (diabetes mellitus) (HCC)    type 2   Gout    Hypercalcemia    Hypercholesteremia    Hypertension    PONV (postoperative nausea and vomiting)    Rheumatoid arteritis (HCC)    Stroke [facility])    TIA 1994   Past Surgical History:  Procedure Laterality Date   bladder prolapse  2018   Dr. Cranston Neighbor   carpal tunnel Bilateral 1998   CATARACT EXTRACTION Bilateral 10/2020   DILATION AND CURETTAGE OF UTERUS  1964   Dr. Alease Medina   MENISCUS REPAIR Right 2010   Dr. 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 Her smoking use included cigarettes. She has never used smokeless tobacco. She reports current alcohol use of about 1.0 - 2.0 standard drink of alcohol per week. She reports that she does not  use drugs.  Family History  Problem Relation Age of Onset   Hypertension Son    Hypertension Son    Hypertension Son     Medications: Patient's Medications  New Prescriptions   No medications on file  Previous Medications   ACETAMINOPHEN (TYLENOL) 500 MG TABLET    Take 1,000 mg by mouth every 8 (eight) hours as needed for moderate pain.   ALLOPURINOL (ZYLOPRIM) 300 MG TABLET    TAKE 1 TABLET DAILY   AMLODIPINE (NORVASC) 5 MG TABLET    TAKE 1 TABLET DAILY   ASPIRIN EC 325 MG TABLET    Take 325 mg by mouth daily.   ATORVASTATIN (LIPITOR) 40 MG TABLET    TAKE 1 TABLET DAILY   BIOTIN 16109 MCG TABS    Take 10,000 mcg by mouth daily.   CHOLECALCIFEROL (VITAMIN D3) 50 MCG (2000 UT) CAPSULE    Take 2,000 Units by mouth daily.   FOLIC ACID (FOLVITE) 800 MCG TABLET    Take 800 mcg by mouth daily.    GLUCOSE BLOOD (ONETOUCH VERIO) TEST STRIP    USE TO TEST BLOOD SUGAR    ONCE DAILY   LANCETS (ONETOUCH DELICA PLUS LANCET33G) MISC    Use to check blood sugar once daily.   LATANOPROST (XALATAN) 0.005 % OPHTHALMIC SOLUTION    Place 1 drop into both eyes at bedtime.   LOSARTAN (COZAAR) 100 MG TABLET    TAKE 1 TABLET BY MOUTH EVERY DAY   METFORMIN (GLUCOPHAGE) 500 MG TABLET    TAKE 1 TABLET DAILY   METOPROLOL TARTRATE (LOPRESSOR) 25 MG TABLET    TAKE 0.5 TABLETS BY MOUTH 2 TIMES DAILY.  Modified Medications   No medications on file  Discontinued Medications   No medications on file    Physical Exam:  Vitals:   12/08/23 1113  BP: (!) 142/82  Pulse: 82  Resp: 17  Temp: 97.8 F (36.6 C)  TempSrc: Temporal  SpO2: 98%  Weight: 165 lb (74.8 kg)  Height: 5\' 2"  (1.575 m)   Body mass index is 30.18 kg/m. Wt Readings from Last 3 Encounters:  12/08/23 165 lb (74.8 kg)  10/24/23 166 lb (75.3 kg)  06/09/23 166 lb 9.6 oz (75.6 kg)    Physical Exam Constitutional:      General: She is not in acute distress.    Appearance: She is well-developed. She is not diaphoretic.  HENT:     Head:  Normocephalic and atraumatic.     Mouth/Throat:     Pharynx: No oropharyngeal exudate.  Eyes:     Conjunctiva/sclera: Conjunctivae normal.     Pupils: Pupils are equal, round, and reactive to light.  Cardiovascular:     Rate and Rhythm: Normal rate and regular rhythm.     Heart sounds: Normal heart sounds.  Pulmonary:     Effort: Pulmonary effort is normal.     Breath sounds: Normal breath sounds.  Abdominal:     General: Bowel sounds are normal.  Palpations: Abdomen is soft.  Musculoskeletal:     Cervical back: Normal range of motion and neck supple.     Right lower leg: No edema.     Left lower leg: No edema.  Skin:    General: Skin is warm and dry.  Neurological:     Mental Status: She is alert.  Psychiatric:        Mood and Affect: Mood normal.     Labs reviewed: Basic Metabolic Panel: Recent Labs    06/09/23 1147  NA 141  K 4.5  CL 105  CO2 27  GLUCOSE 111*  BUN 14  CREATININE 0.61  CALCIUM 11.2*   Liver Function Tests: Recent Labs    06/09/23 1147  AST 18  ALT 19  BILITOT 1.2  PROT 6.8   No results for input(s): "LIPASE", "AMYLASE" in the last 8760 hours. No results for input(s): "AMMONIA" in the last 8760 hours. CBC: Recent Labs    06/09/23 1147  WBC 8.3  NEUTROABS 5,569  HGB 14.7  HCT 45.1*  MCV 98.0  PLT 263   Lipid Panel: Recent Labs    06/09/23 1147  CHOL 167  HDL 70  LDLCALC 75  TRIG 137  CHOLHDL 2.4   TSH: No results for input(s): "TSH" in the last 8760 hours. A1C: Lab Results  Component Value Date   HGBA1C 5.9 (H) 06/09/2023     Assessment/Plan Assessment and Plan    Hypertension Well controlled with home blood pressure readings ranging from 100-130s/50-70s. -Continue current management.  Type 2 Diabetes Mellitus No reported hypoglycemic episodes. On Metformin. -Check HbA1c today.  Angina Reports of intermittent angina twinges, previously evaluated by a cardiologist. No change in frequency or severity.  Does not wish to follow up with cardiologist despite symptoms.  -Continue Metoprolol.  Osteoporosis Recommended bone density scan scheduled for September 3rd. -Continue current management.  Hypercalcemia Previously noted elevated calcium. -Check serum calcium today. -     COMPLETE METABOLIC PANEL WITH GFR -     PTH, intact and calcium  Hyperuricemia/Gout No recent flares reported. -Check serum uric acid today.  Obesity -education provided on healthy weight loss through increase in physical activity and proper nutrition   Tachycardia -stable at this time, no ongoing tachycardia noted  Continues on metoprolol   Primary osteoarthritis of right knee -Stable at this time.    Return in about 6 months (around 06/06/2024) for labs at time of visit, routine follow up.  Janene Harvey. [facility] & Adult Medicine [phone]

## 2023-12-09 ENCOUNTER — Encounter: Payer: Self-pay | Admitting: Nurse Practitioner

## 2023-12-09 LAB — LIPID PANEL
Cholesterol: 182 mg/dL (ref <200)
HDL: 78 mg/dL (ref >=50)
LDL Cholesterol (Calc): 79 mg/dL
Non-HDL Cholesterol (Calc): 104 mg/dL (ref <130)
Total CHOL/HDL Ratio: 2.3 (calc) (ref <5.0)
Triglycerides: 152 mg/dL — ABNORMAL HIGH (ref <150)

## 2023-12-09 LAB — CBC WITH DIFFERENTIAL/PLATELET
Absolute Lymphocytes: 2797 {cells}/uL (ref 850–3900)
Absolute Monocytes: 487 {cells}/uL (ref 200–950)
Basophils Absolute: 50 {cells}/uL (ref 0–200)
Basophils Relative: 0.6 %
Eosinophils Absolute: 8 {cells}/uL — ABNORMAL LOW (ref 15–500)
Eosinophils Relative: 0.1 %
HCT: 44.2 % (ref 35.0–45.0)
Hemoglobin: 14.5 g/dL (ref 11.7–15.5)
MCH: 31.5 pg (ref 27.0–33.0)
MCHC: 32.8 g/dL (ref 32.0–36.0)
MCV: 95.9 fL (ref 80.0–100.0)
MPV: 9.3 fL (ref 7.5–12.5)
Monocytes Relative: 5.8 %
Neutro Abs: 5057 {cells}/uL (ref 1500–7800)
Neutrophils Relative %: 60.2 %
Platelets: 292 10*3/uL (ref 140–400)
RBC: 4.61 10*6/uL (ref 3.80–5.10)
RDW: 12.7 % (ref 11.0–15.0)
Total Lymphocyte: 33.3 %
WBC: 8.4 10*3/uL (ref 3.8–10.8)

## 2023-12-09 LAB — HEMOGLOBIN A1C
Hgb A1c MFr Bld: 5.8 %{Hb} — ABNORMAL HIGH (ref <5.7)
Mean Plasma Glucose: 120 mg/dL
eAG (mmol/L): 6.6 mmol/L

## 2023-12-09 LAB — COMPLETE METABOLIC PANEL WITH GFR
AG Ratio: 2.5 (calc) (ref 1.0–2.5)
ALT: 17 U/L (ref 6–29)
AST: 17 U/L (ref 10–35)
Albumin: 4.9 g/dL (ref 3.6–5.1)
Alkaline phosphatase (APISO): 94 U/L (ref 37–153)
BUN/Creatinine Ratio: 27 (calc) — ABNORMAL HIGH (ref 6–22)
BUN: 16 mg/dL (ref 7–25)
CO2: 26 mmol/L (ref 20–32)
Calcium: 10.8 mg/dL — ABNORMAL HIGH (ref 8.6–10.4)
Chloride: 106 mmol/L (ref 98–110)
Creat: 0.59 mg/dL — ABNORMAL LOW (ref 0.60–0.95)
Globulin: 2 g/dL (ref 1.9–3.7)
Glucose, Bld: 107 mg/dL — ABNORMAL HIGH (ref 65–99)
Potassium: 4.4 mmol/L (ref 3.5–5.3)
Sodium: 142 mmol/L (ref 135–146)
Total Bilirubin: 1.1 mg/dL (ref 0.2–1.2)
Total Protein: 6.9 g/dL (ref 6.1–8.1)
eGFR: 90 mL/min/{1.73_m2} (ref >=60)

## 2023-12-09 LAB — URIC ACID: Uric Acid, Serum: 3.6 mg/dL (ref 2.5–7.0)

## 2023-12-09 LAB — PTH, INTACT AND CALCIUM
Calcium: 10.8 mg/dL — ABNORMAL HIGH (ref 8.6–10.4)
PTH: 72 pg/mL (ref 16–77)

## 2023-12-18 ENCOUNTER — Other Ambulatory Visit: Payer: Self-pay | Admitting: Nurse Practitioner

## 2024-01-23 ENCOUNTER — Other Ambulatory Visit: Payer: Self-pay | Admitting: Nurse Practitioner

## 2024-01-23 DIAGNOSIS — I10 Essential (primary) hypertension: Secondary | ICD-10-CM

## 2024-02-08 ENCOUNTER — Other Ambulatory Visit: Payer: Self-pay | Admitting: Nurse Practitioner

## 2024-02-12 ENCOUNTER — Encounter: Payer: Self-pay | Admitting: Nurse Practitioner

## 2024-02-12 NOTE — Telephone Encounter (Signed)
 Spoke this afternoon to let her know that the medication has been refill and sign, however patient did stated that the pharmacy was not to send medication. I did let the patient know that I will call the pharmacy tomorrow

## 2024-02-15 ENCOUNTER — Other Ambulatory Visit: Payer: Self-pay | Admitting: Nurse Practitioner

## 2024-05-30 ENCOUNTER — Other Ambulatory Visit: Payer: Self-pay | Admitting: Nurse Practitioner

## 2024-05-30 DIAGNOSIS — R000 Tachycardia, unspecified: Secondary | ICD-10-CM

## 2024-05-30 DIAGNOSIS — I10 Essential (primary) hypertension: Secondary | ICD-10-CM

## 2024-06-04 ENCOUNTER — Encounter: Payer: Self-pay | Admitting: Nurse Practitioner

## 2024-06-07 ENCOUNTER — Ambulatory Visit (INDEPENDENT_AMBULATORY_CARE_PROVIDER_SITE_OTHER): Payer: Medicare HMO | Admitting: Nurse Practitioner

## 2024-06-07 ENCOUNTER — Encounter: Payer: Self-pay | Admitting: Nurse Practitioner

## 2024-06-07 VITALS — BP 130/84 | HR 71 | Temp 97.9°F | Ht 62.0 in | Wt 158.0 lb

## 2024-06-07 DIAGNOSIS — I10 Essential (primary) hypertension: Secondary | ICD-10-CM

## 2024-06-07 DIAGNOSIS — M1A9XX0 Chronic gout, unspecified, without tophus (tophi): Secondary | ICD-10-CM | POA: Diagnosis not present

## 2024-06-07 DIAGNOSIS — E1169 Type 2 diabetes mellitus with other specified complication: Secondary | ICD-10-CM | POA: Diagnosis not present

## 2024-06-07 DIAGNOSIS — N8110 Cystocele, unspecified: Secondary | ICD-10-CM | POA: Diagnosis not present

## 2024-06-07 DIAGNOSIS — E782 Mixed hyperlipidemia: Secondary | ICD-10-CM | POA: Diagnosis not present

## 2024-06-07 DIAGNOSIS — M150 Primary generalized (osteo)arthritis: Secondary | ICD-10-CM

## 2024-06-07 NOTE — Assessment & Plan Note (Signed)
 Blood pressure well controlled, goal bp <140/90 Continue current medications and dietary modifications follow metabolic panel

## 2024-06-07 NOTE — Assessment & Plan Note (Signed)
 Stable without recent flare Uric acid at goal in feb

## 2024-06-07 NOTE — Assessment & Plan Note (Signed)
 Hx of repair but now with recurrent prolapse.  Will refer to urology/urogyn- first available for evaluation at this time

## 2024-06-07 NOTE — Progress Notes (Signed)
 Careteam: Patient Care Team: Caro Harlene POUR, NP as PCP - General (Geriatric Medicine)  PLACE OF SERVICE:  [facility]  Advanced Directive information    Allergies  Allergen Reactions   Bactrim  [Sulfamethoxazole -Trimethoprim ] Diarrhea   Percocet [Oxycodone -Acetaminophen ] Nausea And Vomiting   Clindamycin/Lincomycin Rash and Diarrhea    Terrible stomach cramps per patient   Penicillins Rash, Hives and Swelling    Chief Complaint  Patient presents with   Medical Management of Chronic Issues    6 month follow-up and discuss getting labs (patient is fasting).  Patient with at home b/p readings. Fast blood sugar today was 111.     HPI:  Discussed the use of AI scribe software for clinical note transcription with the patient, who gave verbal consent to proceed.  History of Present Illness Debra Madden is an 83 year old female here today for routine follow up, she has a history of bladder prolapse who presents with recurrent bladder prolapse and associated pain.  She experiences recurrent bladder prolapse, which is painful. She previously underwent surgical repair for this condition in [facility] in 2017 or 2018, performed by a urologist named Jinnie Hunting. A sling procedure was also done to support the urethra. The prolapse has recurred, and she can feel a 'big bulge'. She manages the pain with Tylenol  as needed.  No difficulty urinating is reported, but she mentions frequent urination. No issues with vaginal bleeding or constipation. She denies any palpitations, chest pains, or shortness of breath. No recent gout flares, and her knees are doing well.    Review of Systems:  Review of Systems  Constitutional:  Negative for chills, fever and weight loss.  HENT:  Negative for tinnitus.   Respiratory:  Negative for cough, sputum production and shortness of breath.   Cardiovascular:  Negative for chest pain, palpitations and leg swelling.  Gastrointestinal:   Negative for abdominal pain, constipation, diarrhea and heartburn.  Genitourinary:  Positive for frequency. Negative for dysuria and urgency.  Musculoskeletal:  Negative for back pain, falls, joint pain and myalgias.  Skin: Negative.   Neurological:  Negative for dizziness and headaches.  Psychiatric/Behavioral:  Negative for depression and memory loss. The patient does not have insomnia.     Past Medical History:  Diagnosis Date   Arthritis    DM (diabetes mellitus) (HCC)    type 2   Gout    Hypercalcemia    Hypercholesteremia    Hypertension    PONV (postoperative nausea and vomiting)    Rheumatoid arteritis (HCC)    Stroke [facility])    TIA 1994   Past Surgical History:  Procedure Laterality Date   bladder prolapse  2018   Dr. Jinnie Ferron   carpal tunnel Bilateral 1998   CATARACT EXTRACTION Bilateral 10/2020   DILATION AND CURETTAGE OF UTERUS  1964   Dr. Addie Lunger   MENISCUS REPAIR Right 2010   Dr. 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 Her smoking use included cigarettes. She has never used smokeless tobacco. She reports current alcohol use of about 1.0 - 2.0 standard drink of alcohol per week. She reports that she does not use  drugs.  Family History  Problem Relation Age of Onset   Hypertension Son    Hypertension Son    Hypertension Son     Medications: Patient's Medications  New Prescriptions   No medications on file  Previous Medications   ACETAMINOPHEN  (TYLENOL ) 500 MG TABLET    Take 1,000 mg by mouth every 8 (eight) hours as needed for moderate pain.   ALLOPURINOL  (ZYLOPRIM ) 300 MG TABLET    TAKE 1 TABLET DAILY   AMLODIPINE  (NORVASC ) 5 MG TABLET     TAKE 1 TABLET DAILY   ASPIRIN  EC 325 MG TABLET    Take 325 mg by mouth daily.   ATORVASTATIN  (LIPITOR) 40 MG TABLET    TAKE 1 TABLET DAILY   BIOTIN  10000 MCG TABS    Take 10,000 mcg by mouth daily.   CHOLECALCIFEROL  (VITAMIN D3) 50 MCG (2000 UT) CAPSULE    Take 2,000 Units by mouth daily.   FOLIC ACID  (FOLVITE ) 800 MCG TABLET    Take 800 mcg by mouth daily.    LANCETS (ONETOUCH DELICA PLUS LANCET33G) MISC    USE TO CHECK BLOOD SUGAR   ONCE DAILY   LATANOPROST  (XALATAN ) 0.005 % OPHTHALMIC SOLUTION    Place 1 drop into both eyes at bedtime.   LOSARTAN  (COZAAR ) 100 MG TABLET    TAKE 1 TABLET BY MOUTH EVERY DAY   METFORMIN  (GLUCOPHAGE ) 500 MG TABLET    TAKE 1 TABLET DAILY   METOPROLOL  TARTRATE (LOPRESSOR ) 25 MG TABLET    TAKE 0.5 TABLETS BY MOUTH 2 TIMES DAILY.   ONETOUCH VERIO TEST STRIP    USE TO TEST BLOOD SUGAR    ONCE DAILY  Modified Medications   No medications on file  Discontinued Medications   No medications on file    Physical Exam:  Vitals:   06/07/24 1010  BP: 130/84  Pulse: 71  Temp: 97.9 F (36.6 C)  TempSrc: Temporal  SpO2: 97%  Weight: 158 lb (71.7 kg)  Height: 5' 2 (1.575 m)   Body mass index is 28.9 kg/m. Wt Readings from Last 3 Encounters:  06/07/24 158 lb (71.7 kg)  12/08/23 165 lb (74.8 kg)  10/24/23 166 lb (75.3 kg)    Physical Exam Constitutional:      General: She is not in acute distress.    Appearance: She is well-developed. She is not diaphoretic.  HENT:     Head: Normocephalic and atraumatic.     Mouth/Throat:     Pharynx: No oropharyngeal exudate.  Eyes:     Conjunctiva/sclera: Conjunctivae normal.     Pupils: Pupils are equal, round, and reactive to light.  Cardiovascular:     Rate and Rhythm: Normal rate and regular rhythm.     Heart sounds: Normal heart sounds.  Pulmonary:     Effort: Pulmonary effort is normal.     Breath sounds: Normal breath sounds.  Abdominal:     General: Bowel sounds are normal.     Palpations: Abdomen is  soft.  Musculoskeletal:     Cervical back: Normal range of motion and neck supple.     Right lower leg: No edema.     Left lower leg: No edema.  Skin:    General: Skin is warm and dry.  Neurological:     Mental Status: She is alert.  Psychiatric:        Mood and Affect: Mood normal.     Labs reviewed: Basic Metabolic Panel: Recent Labs    06/09/23 1147 12/08/23  1142  NA 141 142  K 4.5 4.4  CL 105 106  CO2 27 26  GLUCOSE 111* 107*  BUN 14 16  CREATININE 0.61 0.59*  CALCIUM  11.2* 10.8*  10.8*   Liver Function Tests: Recent Labs    06/09/23 1147 12/08/23 1142  AST 18 17  ALT 19 17  BILITOT 1.2 1.1  PROT 6.8 6.9   No results for input(s): LIPASE, AMYLASE in the last 8760 hours. No results for input(s): AMMONIA in the last 8760 hours. CBC: Recent Labs    06/09/23 1147 12/08/23 1142  WBC 8.3 8.4  NEUTROABS 5,569 5,057  HGB 14.7 14.5  HCT 45.1* 44.2  MCV 98.0 95.9  PLT 263 292   Lipid Panel: Recent Labs    06/09/23 1147 12/08/23 1142  CHOL 167 182  HDL 70 78  LDLCALC 75 79  TRIG 137 152*  CHOLHDL 2.4 2.3   TSH: No results for input(s): TSH in the last 8760 hours. A1C: Lab Results  Component Value Date   HGBA1C 5.8 (H) 12/08/2023     Assessment/Plan  Female bladder prolapse Assessment & Plan: Hx of repair but now with recurrent prolapse.  Will refer to urology/urogyn- first available for evaluation at this time  Orders: -     Ambulatory referral to Urogynecology -     Ambulatory referral to Urology  Type 2 diabetes mellitus with other specified complication, without long-term current use of insulin (HCC) Assessment & Plan: Encouraged dietary compliance, routine foot care/monitoring and to keep up with diabetic eye exams through ophthalmology  Continues on metformin  Follow up a1c  Orders: -     Hemoglobin A1c -     Microalbumin / creatinine urine ratio  Mixed hyperlipidemia Assessment & Plan: Continues on lipitor 40 mg  dialy   Orders: -     Lipid panel  Chronic gout without tophus, unspecified cause, unspecified site Assessment & Plan: Stable without recent flare Uric acid at goal in feb   Hypercalcemia -     PTH, intact and calcium   Essential hypertension Assessment & Plan: Blood pressure well controlled, goal bp <140/90 Continue current medications and dietary modifications follow metabolic panel   Orders: -     CBC with Differential/Platelet -     Comprehensive metabolic panel with GFR  Primary osteoarthritis involving multiple joints Assessment & Plan: Stable at this time, continue to use tylenol  PRN      Return in about 6 months (around 12/08/2024) for routine follow up.  Murna Backer K. Caro BODILY [facility] & Adult Medicine [phone]

## 2024-06-07 NOTE — Assessment & Plan Note (Signed)
 Stable at this time, continue to use tylenol  PRN

## 2024-06-07 NOTE — Assessment & Plan Note (Signed)
 Continues on lipitor 40 mg dialy

## 2024-06-07 NOTE — Assessment & Plan Note (Signed)
 Encouraged dietary compliance, routine foot care/monitoring and to keep up with diabetic eye exams through ophthalmology  Continues on metformin  Follow up a1c

## 2024-06-08 LAB — COMPREHENSIVE METABOLIC PANEL WITH GFR
AG Ratio: 2.5 (calc) (ref 1.0–2.5)
ALT: 20 U/L (ref 6–29)
AST: 22 U/L (ref 10–35)
Albumin: 4.8 g/dL (ref 3.6–5.1)
Alkaline phosphatase (APISO): 95 U/L (ref 37–153)
BUN: 17 mg/dL (ref 7–25)
CO2: 26 mmol/L (ref 20–32)
Calcium: 11.2 mg/dL — ABNORMAL HIGH (ref 8.6–10.4)
Chloride: 105 mmol/L (ref 98–110)
Creat: 0.67 mg/dL (ref 0.60–0.95)
Globulin: 1.9 g/dL (ref 1.9–3.7)
Glucose, Bld: 109 mg/dL — ABNORMAL HIGH (ref 65–99)
Potassium: 4.9 mmol/L (ref 3.5–5.3)
Sodium: 142 mmol/L (ref 135–146)
Total Bilirubin: 0.7 mg/dL (ref 0.2–1.2)
Total Protein: 6.7 g/dL (ref 6.1–8.1)
eGFR: 87 mL/min/1.73m2 (ref >=60)

## 2024-06-08 LAB — LIPID PANEL
Cholesterol: 169 mg/dL (ref <200)
HDL: 68 mg/dL (ref >=50)
LDL Cholesterol (Calc): 80 mg/dL
Non-HDL Cholesterol (Calc): 101 mg/dL (ref <130)
Total CHOL/HDL Ratio: 2.5 (calc) (ref <5.0)
Triglycerides: 118 mg/dL (ref <150)

## 2024-06-08 LAB — CBC WITH DIFFERENTIAL/PLATELET
Absolute Lymphocytes: 1958 {cells}/uL (ref 850–3900)
Absolute Monocytes: 348 {cells}/uL (ref 200–950)
Basophils Absolute: 31 {cells}/uL (ref 0–200)
Basophils Relative: 0.5 %
Eosinophils Absolute: 12 {cells}/uL — ABNORMAL LOW (ref 15–500)
Eosinophils Relative: 0.2 %
HCT: 45.3 % — ABNORMAL HIGH (ref 35.0–45.0)
Hemoglobin: 15 g/dL (ref 11.7–15.5)
MCH: 31.5 pg (ref 27.0–33.0)
MCHC: 33.1 g/dL (ref 32.0–36.0)
MCV: 95.2 fL (ref 80.0–100.0)
MPV: 9.1 fL (ref 7.5–12.5)
Monocytes Relative: 5.7 %
Neutro Abs: 3752 {cells}/uL (ref 1500–7800)
Neutrophils Relative %: 61.5 %
Platelets: 273 Thousand/uL (ref 140–400)
RBC: 4.76 Million/uL (ref 3.80–5.10)
RDW: 12.6 % (ref 11.0–15.0)
Total Lymphocyte: 32.1 %
WBC: 6.1 Thousand/uL (ref 3.8–10.8)

## 2024-06-08 LAB — HEMOGLOBIN A1C
Hgb A1c MFr Bld: 5.9 % — ABNORMAL HIGH (ref <5.7)
Mean Plasma Glucose: 123 mg/dL
eAG (mmol/L): 6.8 mmol/L

## 2024-06-08 LAB — PTH, INTACT AND CALCIUM
Calcium: 11.2 mg/dL — ABNORMAL HIGH (ref 8.6–10.4)
PTH: 69 pg/mL (ref 16–77)

## 2024-06-08 LAB — MICROALBUMIN / CREATININE URINE RATIO
Creatinine, Urine: 14 mg/dL — ABNORMAL LOW (ref 20–275)
Microalb Creat Ratio: 21 mg/g{creat} (ref <30)
Microalb, Ur: 0.3 mg/dL

## 2024-06-10 ENCOUNTER — Ambulatory Visit: Payer: Self-pay | Admitting: Nurse Practitioner

## 2024-06-23 ENCOUNTER — Other Ambulatory Visit: Payer: Medicare HMO

## 2024-07-21 ENCOUNTER — Other Ambulatory Visit: Payer: Self-pay | Admitting: Nurse Practitioner

## 2024-07-21 DIAGNOSIS — I10 Essential (primary) hypertension: Secondary | ICD-10-CM

## 2024-09-13 ENCOUNTER — Other Ambulatory Visit: Payer: Self-pay | Admitting: Nurse Practitioner

## 2024-10-11 ENCOUNTER — Encounter: Payer: Self-pay | Admitting: Nurse Practitioner

## 2024-10-11 DIAGNOSIS — E1169 Type 2 diabetes mellitus with other specified complication: Secondary | ICD-10-CM

## 2024-10-11 MED ORDER — LANCETS MISC. MISC: 1.0000 | Freq: Every day | 11 refills | Status: AC

## 2024-10-11 MED ORDER — TRUE METRIX METER W/DEVICE KIT: 1.0000 | PACK | Freq: Every day | 0 refills | Status: AC

## 2024-10-11 MED ORDER — TRUE METRIX BLOOD GLUCOSE TEST VI STRP: 1.0000 | ORAL_STRIP | Freq: Every day | 11 refills | Status: AC

## 2024-10-17 ENCOUNTER — Other Ambulatory Visit: Payer: Self-pay | Admitting: Nurse Practitioner

## 2024-10-25 ENCOUNTER — Encounter: Payer: Self-pay | Admitting: Nurse Practitioner

## 2024-10-25 ENCOUNTER — Encounter: Payer: Medicare HMO | Admitting: Nurse Practitioner

## 2024-10-25 VITALS — BP 120/80 | HR 75 | Temp 96.7°F | Resp 18 | Ht 62.0 in | Wt 156.6 lb

## 2024-10-25 DIAGNOSIS — Z0000 Encounter for general adult medical examination without abnormal findings: Secondary | ICD-10-CM | POA: Diagnosis not present

## 2024-10-25 NOTE — Progress Notes (Signed)
 "  No chief complaint on file.    Subjective:   Debra Madden is a 84 y.o. female who presents for a [employer] Visit.  Visit info / Clinical Intake: Medicare Wellness Visit Type:: Subsequent Annual Wellness Visit Persons participating in visit and providing information:: patient Medicare Wellness Visit Mode:: In-person (required for WTM) Interpreter Needed?: No Pre-visit prep was completed: yes AWV questionnaire completed by patient prior to visit?: yes Date:: 10/25/24 Living arrangements:: lives with spouse/significant other Patient's Overall Health Status Rating: very good Typical amount of pain: none Does pain affect daily life?: no Are you currently prescribed opioids?: no  Dietary Habits and Nutritional Risks How many meals a day?: 2 Eats fruit and vegetables daily?: yes Most meals are obtained by: preparing own meals In the last 2 weeks, have you had any of the following?: none Diabetic:: (!) yes Any non-healing wounds?: no How often do you check your BS?: 1 Would you like to be referred to a Nutritionist or for Diabetic Management? : no  Functional Status Activities of Daily Living (to include ambulation/medication): Independent Ambulation: Independent Medication Administration: Independent Home Management (perform basic housework or laundry): Independent Manage your own finances?: yes Primary transportation is: driving Concerns about vision?: no *vision screening is required for WTM* Concerns about hearing?: no  Fall Screening Falls in the past year?: 0 Number of falls in past year: 0 Was there an injury with Fall?: 0 Fall Risk Category Calculator: 0 Patient Fall Risk Level: Low Fall Risk  Fall Risk Patient at Risk for Falls Due to: No Fall Risks Fall risk Follow up: Falls evaluation completed  Home and Transportation Safety: All rugs have non-skid backing?: yes All stairs or steps have railings?: N/A, no stairs Grab bars in the  bathtub or shower?: yes Have non-skid surface in bathtub or shower?: yes Good home lighting?: yes Regular seat belt use?: yes Hospital stays in the last year:: no  Cognitive Assessment Difficulty concentrating, remembering, or making decisions? : no Will 6CIT or Mini Cog be Completed: yes What year is it?: 0 points What month is it?: 0 points Give patient an address phrase to remember (5 components): [address] [employer], [place] About what time is it?: 0 points Count backwards from 20 to 1: 0 points Say the months of the year in reverse: 0 points Repeat the address phrase from earlier: 0 points 6 CIT Score: 0 points  Advance Directives (For Healthcare) Does Patient Have a Medical Advance Directive?: Yes Does patient want to make changes to medical advance directive?: No - Patient declined Type of Advance Directive: Healthcare Power of [place]; Living will Copy of Healthcare Power of Attorney in Chart?: Yes - validated most recent copy scanned in chart (See row information) Copy of Living Will in Chart?: Yes - validated most recent copy scanned in chart (See row information)  Reviewed/Updated  Reviewed/Updated: Reviewed All (Medical, Surgical, Family, Medications, Allergies, Care Teams, Patient Goals)    Allergies (verified) Bactrim  [sulfamethoxazole -trimethoprim ], Percocet [oxycodone -acetaminophen ], Clindamycin/lincomycin, and Penicillins   Current Medications (verified) Outpatient Encounter Medications as of 10/25/2024  Medication Sig   acetaminophen  (TYLENOL ) 500 MG tablet Take 1,000 mg by mouth every 8 (eight) hours as needed for moderate pain.   allopurinol  (ZYLOPRIM ) 300 MG tablet TAKE 1 TABLET DAILY   amLODipine  (NORVASC ) 5 MG tablet TAKE 1 TABLET DAILY   aspirin  EC 325 MG tablet Take 325 mg by mouth daily.   atorvastatin  (LIPITOR) 40 MG tablet TAKE 1 TABLET DAILY  Biotin  10000 MCG TABS Take 10,000 mcg by mouth daily.   Blood Glucose Monitoring Suppl (TRUE METRIX  METER) w/Device KIT 1 each by Does not apply route daily. Trividia E11.9   Cholecalciferol  (VITAMIN D3) 50 MCG (2000 UT) capsule Take 2,000 Units by mouth daily.   folic acid  (FOLVITE ) 800 MCG tablet Take 800 mcg by mouth daily.    glucose blood (TRUE METRIX BLOOD GLUCOSE TEST) test strip 1 each by Other route daily. Trividia E11.9   Lancets Misc. MISC 1 each by Does not apply route daily. Trividia true metrix E11.9   latanoprost  (XALATAN ) 0.005 % ophthalmic solution Place 1 drop into both eyes at bedtime.   losartan  (COZAAR ) 100 MG tablet TAKE 1 TABLET BY MOUTH EVERY DAY   metFORMIN  (GLUCOPHAGE ) 500 MG tablet TAKE 1 TABLET DAILY   metoprolol  tartrate (LOPRESSOR ) 25 MG tablet TAKE 0.5 TABLETS BY MOUTH 2 TIMES DAILY.   No facility-administered encounter medications on file as of 10/25/2024.    History: Past Medical History:  Diagnosis Date   Arthritis    DM (diabetes mellitus) (HCC)    type 2   Gout    Hypercalcemia    Hypercholesteremia    Hypertension    PONV (postoperative nausea and vomiting)    Rheumatoid arteritis (HCC)    Stroke (HCC)    TIA 1994   Past Surgical History:  Procedure Laterality Date   bladder prolapse  2018   Dr. Jinnie Ferron   carpal tunnel Bilateral 1998   CATARACT EXTRACTION Bilateral 10/2020   DILATION AND CURETTAGE OF UTERUS  1964   Dr. Addie Lunger   MENISCUS REPAIR Right 2010   Dr. 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   Procedure: RIGHT TOTAL KNEE ARTHROPLASTY;  Surgeon: Vernetta Lonni GRADE, MD;  Location: [facility] OR;  Service: Orthopedics;  Laterality: Right;   Family History  Problem Relation Age of Onset   Hypertension Son    Hypertension Son    Hypertension Son    Social History   Occupational History   Not on file  Tobacco Use   Smoking status: Former    Types: Cigarettes   Smokeless tobacco: Never   Tobacco  comments:    Quit at age 68  Vaping Use   Vaping status: Never Used  Substance and Sexual Activity   Alcohol use: Yes    Alcohol/week: 1.0 - 2.0 standard drink of alcohol    Types: 1 - 2 Glasses of wine per week   Drug use: Never   Sexual activity: Not Currently   Tobacco Counseling Counseling given: Not Answered Tobacco comments: Quit at age 17  SDOH Screenings   Depression (PHQ2-9): Low Risk (10/25/2024)  Tobacco Use: Medium Risk (10/25/2024)   See flowsheets for full screening details  Depression Screen PHQ 2 & 9 Depression Scale- Over the past 2 weeks, how often have you been bothered by any of the following problems? Little interest or pleasure in doing things: 0 Feeling down, depressed, or hopeless (PHQ Adolescent also includes...irritable): 0 PHQ-2 Total Score: 0     Goals Addressed   None          Objective:    Today's Vitals   10/25/24 1250  BP: 120/80  Pulse: 75  Resp: 18  Temp: (!) 96.7 F (35.9 C)  SpO2: 98%  Weight: 156 lb 9.6  oz (71 kg)  Height: 5' 2 (1.575 m)  PainSc: 0-No pain   Body mass index is 28.64 kg/m.  Hearing/Vision screen Hearing Screening - Comments:: No hearing problems for patient  Vision Screening - Comments:: No vision problem with patient  Immunizations and Health Maintenance Health Maintenance  Topic Date Due   FOOT EXAM  06/08/2024   OPHTHALMOLOGY EXAM  07/20/2024   COVID-19 Vaccine (10 - 2025-26 season) 07/30/2024   Zoster Vaccines- Shingrix (1 of 2) 01/23/2025 (Originally 04/[birth year]/1991)   HEMOGLOBIN A1C  12/08/2024   Diabetic kidney evaluation - eGFR measurement  06/07/2025   Diabetic kidney evaluation - Urine ACR  06/07/2025   Medicare Annual Wellness (AWV)  10/25/2025   Pneumococcal Vaccine: 50+ Years  Completed   Influenza Vaccine  Completed   Bone Density Scan  Completed   Meningococcal B Vaccine  Aged Out   DTaP/Tdap/Td  Discontinued        Assessment/Plan:  This is a routine wellness examination for  Taronda.  Patient Care Team: Caro Harlene POUR, NP as PCP - General (Geriatric Medicine)  I have personally reviewed and noted the following in the patients chart:   Medical and social history Use of alcohol, tobacco or illicit drugs  Current medications and supplements including opioid prescriptions. Functional ability and status Nutritional status Physical activity Advanced directives List of other physicians Hospitalizations, surgeries, and ER visits in previous 12 months Vitals Screenings to include cognitive, depression, and falls Referrals and appointments  No orders of the defined types were placed in this encounter.  In addition, I have reviewed and discussed with patient certain preventive protocols, quality metrics, and best practice recommendations. A written personalized care plan for preventive services as well as general preventive health recommendations were provided to patient.   Harlene POUR Caro, NP   10/25/2024   Return in 1 year (on 10/25/2025) for AWV.  After Visit Summary: (In Person-Printed) AVS printed and given to the patient   "

## 2024-10-25 NOTE — Patient Instructions (Signed)
 Debra Madden,  Thank you for taking the time for your Medicare Wellness Visit. I appreciate your continued commitment to your health goals. Please review the care plan we discussed, and feel free to reach out if I can assist you further.  Please note that Annual Wellness Visits do not include a physical exam. Some assessments may be limited, especially if the visit was conducted virtually. If needed, we may recommend an in-person follow-up with your provider.  Ongoing Care Seeing your primary care provider every 3 to 6 months helps us  monitor your health and provide consistent, personalized care.   Referrals If a referral was made during today's visit and you haven't received any updates within two weeks, please contact the referred provider directly to check on the status.  Recommended Screenings:  Health Maintenance  Topic Date Due   Complete foot exam   06/08/2024   Eye exam for diabetics  07/20/2024   COVID-19 Vaccine (10 - 2025-26 season) 07/30/2024   Medicare Annual Wellness Visit  10/23/2024   Zoster (Shingles) Vaccine (1 of 2) 01/23/2025*   Hemoglobin A1C  12/08/2024   Yearly kidney function blood test for diabetes  06/07/2025   Yearly kidney health urinalysis for diabetes  06/07/2025   Pneumococcal Vaccine for age over 75  Completed   Flu Shot  Completed   Osteoporosis screening with Bone Density Scan  Completed   Meningitis B Vaccine  Aged Out   DTaP/Tdap/Td vaccine  Discontinued  *Topic was postponed. The date shown is not the original due date.       10/25/2024   12:57 PM  Advanced Directives  Does Patient Have a Medical Advance Directive? Yes  Type of Estate Agent of [place];Living will  Copy of Healthcare Power of Attorney in Chart? Yes - validated most recent copy scanned in chart (See row information)    Vision: Annual vision screenings are recommended for early detection of glaucoma, cataracts, and diabetic retinopathy. These exams can  also reveal signs of chronic conditions such as diabetes and high blood pressure.  Dental: Annual dental screenings help detect early signs of oral cancer, gum disease, and other conditions linked to overall health, including heart disease and diabetes.  Please see the attached documents for additional preventive care recommendations.

## 2024-11-26 ENCOUNTER — Other Ambulatory Visit: Payer: Self-pay | Admitting: Nurse Practitioner

## 2024-11-26 DIAGNOSIS — R000 Tachycardia, unspecified: Secondary | ICD-10-CM

## 2024-11-26 DIAGNOSIS — I10 Essential (primary) hypertension: Secondary | ICD-10-CM

## 2024-12-10 ENCOUNTER — Ambulatory Visit: Payer: Self-pay | Admitting: Nurse Practitioner

## 2025-10-28 ENCOUNTER — Ambulatory Visit: Admitting: Nurse Practitioner
